# Patient Record
Sex: Male | Born: 1950 | Race: White | Hispanic: No | Marital: Married | Smoking: Never smoker
Health system: Southern US, Community
[De-identification: ages and names within clinical notes are randomized; demographics above are authoritative.]

## PROBLEM LIST (undated history)

## (undated) DIAGNOSIS — H269 Unspecified cataract: Secondary | ICD-10-CM

## (undated) DIAGNOSIS — D473 Essential (hemorrhagic) thrombocythemia: Secondary | ICD-10-CM

## (undated) DIAGNOSIS — I499 Cardiac arrhythmia, unspecified: Secondary | ICD-10-CM

## (undated) DIAGNOSIS — D72829 Elevated white blood cell count, unspecified: Secondary | ICD-10-CM

## (undated) DIAGNOSIS — C9210 Chronic myeloid leukemia, BCR/ABL-positive, not having achieved remission: Secondary | ICD-10-CM

## (undated) DIAGNOSIS — IMO0001 Reserved for inherently not codable concepts without codable children: Secondary | ICD-10-CM

## (undated) DIAGNOSIS — D649 Anemia, unspecified: Secondary | ICD-10-CM

## (undated) HISTORY — PX: HERNIA REPAIR: SHX51

## (undated) HISTORY — DX: Essential (hemorrhagic) thrombocythemia: D47.3

## (undated) HISTORY — PX: TONSILLECTOMY: SUR1361

## (undated) HISTORY — DX: Elevated white blood cell count, unspecified: D72.829

## (undated) HISTORY — DX: Reserved for inherently not codable concepts without codable children: IMO0001

## (undated) HISTORY — DX: Chronic myeloid leukemia, BCR/ABL-positive, not having achieved remission: C92.10

## (undated) HISTORY — DX: Unspecified cataract: H26.9

---

## 2003-04-11 ENCOUNTER — Encounter: Admission: RE | Admit: 2003-04-11 | Discharge: 2003-04-11 | Payer: Self-pay

## 2004-03-08 HISTORY — PX: JOINT REPLACEMENT: SHX530

## 2010-03-08 HISTORY — PX: EYE SURGERY: SHX253

## 2012-09-22 ENCOUNTER — Telehealth: Payer: Self-pay | Admitting: Oncology

## 2012-09-22 NOTE — Telephone Encounter (Signed)
Spoke with Tiara from Dr  Tawana Scale office in ref to np appt on 10/17/12@10 :00.  Tiara will notify pt of appt. Mailed np packet

## 2012-09-26 ENCOUNTER — Telehealth: Payer: Self-pay | Admitting: Oncology

## 2012-09-26 NOTE — Telephone Encounter (Signed)
C/D 09/26/12 for appt 10/17/12

## 2012-10-17 ENCOUNTER — Encounter: Payer: Self-pay | Admitting: Oncology

## 2012-10-17 ENCOUNTER — Ambulatory Visit (HOSPITAL_BASED_OUTPATIENT_CLINIC_OR_DEPARTMENT_OTHER): Payer: BC Managed Care – PPO | Admitting: Lab

## 2012-10-17 ENCOUNTER — Telehealth: Payer: Self-pay | Admitting: Oncology

## 2012-10-17 ENCOUNTER — Other Ambulatory Visit: Payer: BC Managed Care – PPO | Admitting: Lab

## 2012-10-17 ENCOUNTER — Ambulatory Visit: Payer: BC Managed Care – PPO

## 2012-10-17 ENCOUNTER — Ambulatory Visit (HOSPITAL_BASED_OUTPATIENT_CLINIC_OR_DEPARTMENT_OTHER): Payer: BC Managed Care – PPO | Admitting: Oncology

## 2012-10-17 VITALS — BP 176/95 | HR 81 | Temp 98.1°F | Resp 18 | Ht 74.0 in | Wt 208.8 lb

## 2012-10-17 DIAGNOSIS — D473 Essential (hemorrhagic) thrombocythemia: Secondary | ICD-10-CM | POA: Insufficient documentation

## 2012-10-17 DIAGNOSIS — D72829 Elevated white blood cell count, unspecified: Secondary | ICD-10-CM

## 2012-10-17 DIAGNOSIS — C9210 Chronic myeloid leukemia, BCR/ABL-positive, not having achieved remission: Secondary | ICD-10-CM

## 2012-10-17 DIAGNOSIS — D75839 Thrombocytosis, unspecified: Secondary | ICD-10-CM

## 2012-10-17 DIAGNOSIS — D720 Genetic anomalies of leukocytes: Secondary | ICD-10-CM

## 2012-10-17 HISTORY — DX: Elevated white blood cell count, unspecified: D72.829

## 2012-10-17 HISTORY — DX: Thrombocytosis, unspecified: D75.839

## 2012-10-17 LAB — MANUAL DIFFERENTIAL
ALC: 6.2 10*3/uL — ABNORMAL HIGH (ref 0.9–3.3)
ANC (CHCC manual diff): 60.3 10*3/uL — ABNORMAL HIGH (ref 1.5–6.5)
Blasts: 1 % — ABNORMAL HIGH (ref 0–0)
MONO: 4 % (ref 0–14)
Metamyelocytes: 7 % — ABNORMAL HIGH (ref 0–0)
Myelocytes: 7 % — ABNORMAL HIGH (ref 0–0)
Other Cell: 0 % (ref 0–0)
PLT EST: INCREASED
PROMYELO: 1 % — ABNORMAL HIGH (ref 0–0)
SEG: 46 % (ref 38–77)
Variant Lymph: 0 % (ref 0–0)

## 2012-10-17 LAB — CBC WITH DIFFERENTIAL/PLATELET
HCT: 40.8 % (ref 38.4–49.9)
MCH: 28.9 pg (ref 27.2–33.4)
MCHC: 33.8 g/dL (ref 32.0–36.0)
Platelets: 796 10*3/uL — ABNORMAL HIGH (ref 140–400)
RBC: 4.78 10*6/uL (ref 4.20–5.82)

## 2012-10-17 LAB — COMPREHENSIVE METABOLIC PANEL (CC13)
AST: 26 U/L (ref 5–34)
Alkaline Phosphatase: 59 U/L (ref 40–150)
BUN: 14.3 mg/dL (ref 7.0–26.0)
Creatinine: 1.1 mg/dL (ref 0.7–1.3)
Glucose: 95 mg/dl (ref 70–140)
Potassium: 4.2 mEq/L (ref 3.5–5.1)
Total Bilirubin: 0.78 mg/dL (ref 0.20–1.20)

## 2012-10-17 LAB — LACTATE DEHYDROGENASE (CC13): LDH: 582 U/L — ABNORMAL HIGH (ref 125–245)

## 2012-10-17 NOTE — Telephone Encounter (Signed)
, °

## 2012-10-17 NOTE — Progress Notes (Signed)
Checked in new pt with no financial concerns. °

## 2012-10-17 NOTE — Patient Instructions (Addendum)
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 It is called "chronic" because it develops more slowly than other forms of leukemia, which are considered "acute." CAUSES  No one knows the exact cause of this condition. You have a higher risk of developing this kind of leukemia if you have:  An abnormal chromosome called a "[place] chromosome." Chromosomes contain your genes, which determine your physical traits (i.e. eye or hair color).  Had radiation treatment for some other condition or form of cancer.  Been exposed to radiation due to fallout from a nuclear bomb or in the wake of a nuclear accident. SYMPTOMS  At first, some people do not have symptoms of chronic myelogenous leukemia. After a while, people may notice some symptoms, such as:   Feeling more tired than usual, even after rest.  Unplanned weight loss.  Heavy sweating at night.  Fevers.  Paleness.  A feeling of fullness in the upper left part of the abdomen.  Easy bruising and/or bleeding.  More frequent infections. DIAGNOSIS   During a physical exam, your caregiver may notice that you have an enlarged spleen, liver and/or lymph nodes.  Blood and bone marrow tests may show the presence of the [place] chromosome, as well as other abnormalities. TREATMENT  There are different types of treatment used for this condition, including:  Targeted drugs. These medications interfere with chemicals the leukemia cells need in order to grow and multiply.  Chemotherapy drugs. These medications kill cells that are multiplying quickly, such as leukemia cells.  Biological therapy. This treatment boosts the ability of the patient's own immune system to fight the leukemia cells.  Bone marrow or peripheral blood stem cell transplant. This  treatment allows the patient to receive very high doses of chemotherapy and/or radiation. These high doses kill the cancer cells, but also destroy the bone marrow. After treatment is complete, the patient is given donor bone marrow or stem cells, which will replace the bone marrow.  Leukapheresis. This involves sending the patient's blood through a machine to cleanse it of leukemia cells. HOME CARE INSTRUCTIONS   Take all medications exactly as directed. Only take over-the-counter or prescription medicines for pain, discomfort or fever as directed by your caregiver  Although some of your treatments might affect your appetite, try to eat regular, healthy meals.  If you develop any side effects, tell your caregiver. He or she may have recommendations of things you can do to improve symptoms.  Consider learning some ways to cope with the stress of having a chronic illness, such as yoga, meditation, or participating in a support group. SEEK IMMEDIATE MEDICAL CARE IF YOU:  Develop an unexplained oral temperature of 102 F (38.9 C) or more.  Develop chest pains.  Feel short of breath.  Feel light-headed or pass out.  Notice pain, swelling or redness anywhere in your legs.  Have uncontrollable bleeding, such as a nosebleed that will not stop.  Are unable to stop throwing up (vomiting).  Cannot keep liquids down. Document Released: 02/05/2008 Document Revised: 05/17/2011 Document Reviewed: 02/05/2008 [facility] Patient Information 2014 [place], [place].

## 2012-10-18 ENCOUNTER — Encounter (HOSPITAL_COMMUNITY): Payer: Self-pay

## 2012-10-18 ENCOUNTER — Ambulatory Visit (HOSPITAL_COMMUNITY)
Admission: RE | Admit: 2012-10-18 | Discharge: 2012-10-18 | Disposition: A | Discharge status: Home / Self Care | Payer: BC Managed Care – PPO | Source: Ambulatory Visit | Attending: Oncology | Admitting: Oncology

## 2012-10-18 DIAGNOSIS — I2510 Atherosclerotic heart disease of native coronary artery without angina pectoris: Secondary | ICD-10-CM | POA: Insufficient documentation

## 2012-10-18 DIAGNOSIS — N289 Disorder of kidney and ureter, unspecified: Secondary | ICD-10-CM | POA: Insufficient documentation

## 2012-10-18 DIAGNOSIS — M47812 Spondylosis without myelopathy or radiculopathy, cervical region: Secondary | ICD-10-CM | POA: Insufficient documentation

## 2012-10-18 DIAGNOSIS — M4310 Spondylolisthesis, site unspecified: Secondary | ICD-10-CM | POA: Insufficient documentation

## 2012-10-18 DIAGNOSIS — D72829 Elevated white blood cell count, unspecified: Secondary | ICD-10-CM

## 2012-10-18 DIAGNOSIS — D75839 Thrombocytosis, unspecified: Secondary | ICD-10-CM

## 2012-10-18 DIAGNOSIS — C9210 Chronic myeloid leukemia, BCR/ABL-positive, not having achieved remission: Secondary | ICD-10-CM

## 2012-10-18 DIAGNOSIS — D473 Essential (hemorrhagic) thrombocythemia: Secondary | ICD-10-CM

## 2012-10-18 DIAGNOSIS — I658 Occlusion and stenosis of other precerebral arteries: Secondary | ICD-10-CM | POA: Insufficient documentation

## 2012-10-18 DIAGNOSIS — K4090 Unilateral inguinal hernia, without obstruction or gangrene, not specified as recurrent: Secondary | ICD-10-CM | POA: Insufficient documentation

## 2012-10-18 DIAGNOSIS — I6529 Occlusion and stenosis of unspecified carotid artery: Secondary | ICD-10-CM | POA: Insufficient documentation

## 2012-10-18 DIAGNOSIS — M24019 Loose body in unspecified shoulder: Secondary | ICD-10-CM | POA: Insufficient documentation

## 2012-10-18 DIAGNOSIS — K7689 Other specified diseases of liver: Secondary | ICD-10-CM | POA: Insufficient documentation

## 2012-10-18 LAB — DIRECT ANTIGLOBULIN TEST (NOT AT ARMC)
DAT (Complement): NEGATIVE
DAT IgG: NEGATIVE

## 2012-10-18 MED ORDER — IOHEXOL 300 MG/ML  SOLN
100.0000 mL | Freq: Once | INTRAMUSCULAR | Status: AC | PRN
  Administered 2012-10-18: 100 mL via INTRAVENOUS

## 2012-10-19 ENCOUNTER — Encounter: Payer: Self-pay | Admitting: Oncology

## 2012-10-20 ENCOUNTER — Telehealth: Payer: Self-pay | Admitting: *Deleted

## 2012-10-20 NOTE — Telephone Encounter (Signed)
Please let him know his CBC results

## 2012-10-20 NOTE — Telephone Encounter (Signed)
Pt called requesting lab results from 8/12. Will review with MD

## 2012-10-21 ENCOUNTER — Telehealth: Payer: Self-pay | Admitting: *Deleted

## 2012-10-24 ENCOUNTER — Telehealth: Payer: Self-pay | Admitting: Medical Oncology

## 2012-10-24 ENCOUNTER — Other Ambulatory Visit (HOSPITAL_COMMUNITY): Payer: BC Managed Care – PPO

## 2012-10-24 NOTE — Telephone Encounter (Signed)
Called patient, per MD, to confirm receipt of lab results. Patient states he has results expressed thanks. Asked about CT results from last week, informed patient not resulted at this time. Patient verbalized understanding, knows to call office with any questions or concerns.  sched appt with MD 09/02

## 2012-10-25 ENCOUNTER — Encounter (HOSPITAL_COMMUNITY): Payer: Self-pay | Admitting: Pharmacy Technician

## 2012-10-26 ENCOUNTER — Other Ambulatory Visit: Payer: Self-pay | Admitting: Radiology

## 2012-10-30 NOTE — Progress Notes (Signed)
[facility]  Telephone:([phone] Fax:([phone]    INITIAL HEMATOLOGY CONSULTATION    Referral MD:   Dr. 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 Benedetto Goad 4 health maintenance on 08/21/2012. As part of his workup patient had blood work performed including a CBC. He was incidentally found to have an elevated white count and platelet count. Total white count was 42.4 and the platelets were also elevated at 720,000. There were some smudge cells noted. There was a neutrophilia. He had a repeat count performed on 09/12/2012 the white count at that time with 67.5 platelets 705,000. He was noted to have metamyelocytes myelocytes promyelocytes nucleated RBCs in neutrophilia. Another repeat performed on 10/03/2012 revealed a white count to be 76.7 platelet slightly lower at 604,000 but he continued to have left shift. Because of this he is now seen in hematology for further evaluation. Clinically patient is asymptomatic he has not noticed any fevers chills night sweats headaches shortness of breath chest pains palpitations no myalgias and arthralgias. No weight loss or gain no early satiety he has not noticed any masses no lymphadenopathy. Remainder of the 14 point review of systems is negative.    Past Medical History  Diagnosis Date  . Leukocytosis, unspecified 10/17/2012  . Thrombocytosis 10/17/2012  :    History reviewed. No pertinent past surgical history.:   CURRENT MEDS: Current Outpatient Prescriptions  Medication Sig Dispense Refill  . Cholecalciferol (VITAMIN D) 2000 UNITS tablet Take 2,000 Units by mouth 2 (two) times daily.      . Coenzyme Q10 (CO Q-10) 100 MG CAPS Take 100 mg by mouth daily.      . fish oil-omega-3 fatty acids 1000 MG capsule Take 1 g by mouth daily.      . Flaxseed,  Linseed, (FLAX SEED OIL) 1000 MG CAPS Take 1,000 mg by mouth daily.      . folic acid (FOLVITE) 400 MCG tablet Take 400 mcg by mouth daily.      Marland Kitchen OVER THE COUNTER MEDICATION Take 1 capsule by mouth daily. quercetin      . OVER THE COUNTER MEDICATION Take 1 packet by mouth daily. Protease plus      . OVER THE COUNTER MEDICATION Take 1 Package by mouth daily. Royal camu powder      . OVER THE COUNTER MEDICATION Take 2 capsules by mouth 2 (two) times daily. coleus      . OVER THE COUNTER MEDICATION Take 2 capsules by mouth 3 (three) times daily with meals. spurlinina      . saw palmetto 160 MG capsule Take 160 mg by mouth daily.      . TURMERIC PO Take 2 capsules by mouth 2 (two) times daily.       No current facility-administered medications for this visit.      No Known Allergies:  History reviewed. No pertinent family history.:  History   Social History  . Marital Status: Single    Spouse Name: [name]    Number of Children: [name]  . Years of Education: [name]   Occupational History  . Not on file.   Social History Main Topics  . Smoking status: Never Smoker   . Smokeless tobacco: Never Used  . Alcohol Use: 4.2 oz/week    7 Glasses of wine per week  . Drug Use: No  .  Sexual Activity: Yes   Other Topics Concern  . Not on file   Social History Narrative  . No narrative on file  :  REVIEW OF SYSTEM:  The rest of the 14-point review of sytem was negative.   Exam: @IPVITALS @  General:  well-nourished in no acute distress.  Eyes:  no scleral icterus.  ENT:  There were no oropharyngeal lesions.  Neck was without thyromegaly.  Lymphatics:  Negative cervical, supraclavicular or axillary adenopathy.  Respiratory: lungs were clear bilaterally without wheezing or crackles.  Cardiovascular:  Regular rate and rhythm, S1/S2, without murmur, rub or gallop.  There was no pedal edema.  GI:  abdomen was soft, flat, nontender, nondistended, without organomegaly.  Muscoloskeletal:  no spinal  tenderness of palpation of vertebral spine.  Skin exam was without echymosis, petichae.  Neuro exam was nonfocal.  Patient was able to get on and off exam table without assistance.  Gait was normal.  Patient was alerted and oriented.  Attention was good.   Language was appropriate.  Mood was normal without depression.  Speech was not pressured.  Thought content was not tangential.    LABS:  Lab Results  Component Value Date   WBC 77.3* 10/17/2012   HGB 13.8 10/17/2012   HCT 40.8 10/17/2012   PLT 796* 10/17/2012   GLUCOSE 95 10/17/2012   ALT 31 10/17/2012   AST 26 10/17/2012   NA 140 10/17/2012   K 4.2 10/17/2012   CREATININE 1.1 10/17/2012   BUN 14.3 10/17/2012   CO2 24 10/17/2012    Ct Soft Tissue Neck W Contrast  10/18/2012   *RADIOLOGY REPORT*  Clinical Data: CML.  History of leukocytosis.  CT NECK WITH CONTRAST  Technique:  Multidetector CT imaging of the neck was performed with intravenous contrast.  Contrast: OMNIPAQUE IOHEXOL 300 MG/ML  SOLN  Comparison: None.  Findings: Suprahyoid neck:  Normal.  Larynx:  Normal.  Infrahyoid neck:  Normal.  Lymph nodes:  No pathologic adenopathy.  Upper chest/mediastinum:  Please see separate chest CT dictation.  Additional:  Nonstenotic atheromatous change both carotid bifurcations, with calcification.  No intracranial masses.  No sinus disease.  Cervical spondylosis without worrisome osseous lesions.  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   Original Report Authenticated By: Davonna Belling, M.D.   Ct Chest W Contrast  10/18/2012   *RADIOLOGY REPORT*  Clinical Data:  Cm bowel with lymphadenopathy.  CT CHEST, ABDOMEN AND PELVIS WITH CONTRAST  Technique:  Multidetector CT imaging of the chest, abdomen and pelvis was performed following the standard protocol during bolus administration of intravenous contrast.  Contrast: OMNIPAQUE IOHEXOL 300 MG/ML  SOLN  Comparison:   None.  CT CHEST  Findings:   There is no axillary lymphadenopathy.  No mediastinal or hilar lymphadenopathy.  Heart size is upper normal. Coronary artery calcification is noted.  There is no pericardial or pleural effusion.  Lungs are clear without edema or focal airspace consolidation.  No evidence for parenchymal nodule or mass.  Bone windows reveal no worrisome lytic or sclerotic osseous lesions.  Degenerative changes with intra-articular loose body are seen in the left shoulder.  IMPRESSION: No evidence for lymphadenopathy in the chest.  CT ABDOMEN AND PELVIS  Findings:  The liver measures 18.7 cm in cranial caudal length, enlarged.  The spleen measures 15.5 cm, borderline enlarged.  The stomach, duodenum, pancreas, gallbladder, and adrenal glands are unremarkable.  Left kidney is normal.  12 mm  low density lesion in the interpolar right kidney is likely a cyst.  No abdominal aortic aneurysm.  There is no free fluid or lymphadenopathy.  Atherosclerotic calcification is noted in the wall of the abdominal aorta.  Imaging through the pelvis shows no free intraperitoneal fluid. There is no pelvic sidewall lymphadenopathy.  The prostate gland is markedly enlarged.  Left inguinal hernia contains only fat.  Diverticular changes are seen in the left colon without diverticulitis.  The terminal ileum and the appendix are normal.  Bone windows reveal no worrisome lytic or sclerotic osseous lesions.  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  IMPRESSION: Borderline to mild hepatosplenomegaly.  No lymphadenopathy in the abdomen or pelvis.   Original Report Authenticated By: Kennith Center, M.D.   Ct Abdomen Pelvis W Contrast  10/18/2012   *RADIOLOGY REPORT*  Clinical Data:  Cm bowel with lymphadenopathy.  CT CHEST, ABDOMEN AND PELVIS WITH CONTRAST  Technique:  Multidetector CT imaging of the chest, abdomen and pelvis was performed following the standard protocol during bolus administration of intravenous contrast.  Contrast:  OMNIPAQUE IOHEXOL 300 MG/ML  SOLN  Comparison:   None.  CT CHEST  Findings:  There is no axillary lymphadenopathy.  No mediastinal or hilar lymphadenopathy.  Heart size is upper normal. Coronary artery calcification is noted.  There is no pericardial or pleural effusion.  Lungs are clear without edema or focal airspace consolidation.  No evidence for parenchymal nodule or mass.  Bone windows reveal no worrisome lytic or sclerotic osseous lesions.  Degenerative changes with intra-articular loose body are seen in the left shoulder.  IMPRESSION: No evidence for lymphadenopathy in the chest.  CT ABDOMEN AND PELVIS  Findings:  The liver measures 18.7 cm in cranial caudal length, enlarged.  The spleen measures 15.5 cm, borderline enlarged.  The stomach, duodenum, pancreas, gallbladder, and adrenal glands are unremarkable.  Left kidney is normal.  12 mm low density lesion in the interpolar right kidney is likely a cyst.  No abdominal aortic aneurysm.  There is no free fluid or lymphadenopathy.  Atherosclerotic calcification is noted in the wall of the abdominal aorta.  Imaging through the pelvis shows no free intraperitoneal fluid. There is no pelvic sidewall lymphadenopathy.  The prostate gland is markedly enlarged.  Left inguinal hernia contains only fat.  Diverticular changes are seen in the left colon without diverticulitis.  The terminal ileum and the appendix are normal.  Bone windows reveal no worrisome lytic or sclerotic osseous lesions.  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  IMPRESSION: Borderline to mild hepatosplenomegaly.  No lymphadenopathy in the abdomen or pelvis.   Original Report Authenticated By: Kennith Center, M.D.      ASSESSMENT AND PLAN: 62 year old gentleman with thrombocytosis and leukocytosis and a left shift. His differential does include CML versus CLL. Patient is clinically asymptomatic. I discussed the differential with the patient. I discussed the pathophysiology of CML  as well as CLL.  #1 patient will need further workup including CT chest abdomen pelvis and neck to look for adenopathy.  #2 we will also check CBC CMET, iron studies and peripheral blood flow cytometry as well as peripheral blood BCR/ABL    #3 patient will also need a bone marrow biopsy and aspirate for further workup and staging.Cindi Carbon   #4 I will see him back in a few weeks time for follow  Drue Second, MD Medical/Oncology [facility] [phone] (beeper) [phone] (Office)

## 2012-10-31 ENCOUNTER — Encounter: Payer: Self-pay | Admitting: Oncology

## 2012-11-01 ENCOUNTER — Ambulatory Visit (HOSPITAL_COMMUNITY)
Admission: RE | Admit: 2012-11-01 | Discharge: 2012-11-01 | Disposition: A | Discharge status: Home / Self Care | Payer: BC Managed Care – PPO | Source: Ambulatory Visit | Attending: Oncology | Admitting: Oncology

## 2012-11-01 ENCOUNTER — Encounter (HOSPITAL_COMMUNITY): Payer: Self-pay

## 2012-11-01 DIAGNOSIS — C9210 Chronic myeloid leukemia, BCR/ABL-positive, not having achieved remission: Secondary | ICD-10-CM

## 2012-11-01 LAB — CBC
Platelets: 881 10*3/uL — ABNORMAL HIGH (ref 150–400)
RDW: 18.8 % — ABNORMAL HIGH (ref 11.5–15.5)
WBC: 83.9 10*3/uL (ref 4.0–10.5)

## 2012-11-01 LAB — APTT: aPTT: 33 seconds (ref 24–37)

## 2012-11-01 LAB — BONE MARROW EXAM

## 2012-11-01 LAB — PROTIME-INR: INR: 1.16 (ref 0.00–1.49)

## 2012-11-01 MED ORDER — MIDAZOLAM HCL 2 MG/2ML IJ SOLN
INTRAMUSCULAR | Status: AC | PRN
  Administered 2012-11-01 (×4): 1 mg via INTRAVENOUS

## 2012-11-01 MED ORDER — HYDROCODONE-ACETAMINOPHEN 5-325 MG PO TABS
1.0000 | ORAL_TABLET | ORAL | Status: DC | PRN
  Filled 2012-11-01: qty 2

## 2012-11-01 MED ORDER — FENTANYL CITRATE 0.05 MG/ML IJ SOLN
INTRAMUSCULAR | Status: AC
  Filled 2012-11-01: qty 6

## 2012-11-01 MED ORDER — MIDAZOLAM HCL 2 MG/2ML IJ SOLN
INTRAMUSCULAR | Status: AC
  Filled 2012-11-01: qty 6

## 2012-11-01 MED ORDER — SODIUM CHLORIDE 0.9 % IV SOLN
Freq: Once | INTRAVENOUS | Status: AC
  Administered 2012-11-01: 07:00:00 via INTRAVENOUS

## 2012-11-01 MED ORDER — FENTANYL CITRATE 0.05 MG/ML IJ SOLN
INTRAMUSCULAR | Status: DC | PRN
  Administered 2012-11-01: 50 ug via INTRAVENOUS

## 2012-11-01 MED ORDER — FENTANYL CITRATE 0.05 MG/ML IJ SOLN
INTRAMUSCULAR | Status: AC | PRN
  Administered 2012-11-01: 50 ug via INTRAVENOUS
  Administered 2012-11-01: 100 ug via INTRAVENOUS

## 2012-11-01 NOTE — Progress Notes (Signed)
CRITICAL VALUE ALERT  Critical value received:  WBC 83.9  Date of notification:  11/01/2012  Time of notification:  0810  Critical value read back: yes  Nurse who received alert:  Harrietta Guardian RN  MD notified (1st page):  Coreen PA  Time of first page:  0810  MD notified (2nd page):  Time of second page:  Responding MD:  same  Time MD responded:  [phone]

## 2012-11-01 NOTE — H&P (Signed)
Chief Complaint: "I am here for a bone marrow biopsy." Referring Physician: Dr. Welton Flakes HPI: Larry Vasquez is an 62 y.o. male with leukocytosis and thrombocytosis, patient has been seen by Dr. Welton Flakes in office and denies any weight loss, chest pain, shortness of breath, recent illness, fever, night sweats, or chills. He denies any blood in his stool or urine. CT was performed of neck, chest, abdomen and pelvis with no lymphadenopathy seen. Patient is here today for bone marrow biopsy.   Past Medical History:  Past Medical History  Diagnosis Date  . Leukocytosis, unspecified 10/17/2012  . Thrombocytosis 10/17/2012    Past Surgical History:  Past Surgical History  Procedure Laterality Date  . Joint replacement  2006    Total Knee Left  . Hernia repair    . Tonsillectomy    . Eye surgery  2012    Detached Retina    Family History: No family history on file.  Social History:  reports that he has never smoked. He has never used smokeless tobacco. He reports that he drinks about 4.2 ounces of alcohol per week. He reports that he does not use illicit drugs.  Allergies:  Allergies  Allergen Reactions  . Corticosteroids Other (See Comments)    Stiffness    Medications:   Medication List    ASK your doctor about these medications       Co Q-10 100 MG Caps  Take 100 mg by mouth daily.     fish oil-omega-3 fatty acids 1000 MG capsule  Take 1 g by mouth daily.     Flax Seed Oil 1000 MG Caps  Take 1,000 mg by mouth daily.     folic acid 400 MCG tablet  Commonly known as:  FOLVITE  Take 400 mcg by mouth daily.     OVER THE COUNTER MEDICATION  Take 1 capsule by mouth daily. quercetin     OVER THE COUNTER MEDICATION  Take 1 packet by mouth daily. Protease plus     OVER THE COUNTER MEDICATION  Take 1 Package by mouth daily. Royal camu powder     OVER THE COUNTER MEDICATION  Take 2 capsules by mouth 2 (two) times daily. coleus     OVER THE COUNTER MEDICATION  Take 2 capsules  by mouth 3 (three) times daily with meals. spurlinina     saw palmetto 160 MG capsule  Take 160 mg by mouth daily.     TURMERIC PO  Take 2 capsules by mouth 2 (two) times daily.     Vitamin D 2000 UNITS tablet  Take 2,000 Units by mouth 2 (two) times daily.        Please HPI for pertinent positives, otherwise complete 10 system ROS negative.  Physical Exam: BP 157/91  Pulse 81  Temp(Src) 97.8 F (36.6 C) (Oral)  Resp 20  Ht 6\' 2"  (1.88 m)  Wt 208 lb (94.348 kg)  BMI 26.69 kg/m2  SpO2 97% Body mass index is 26.69 kg/(m^2).   General Appearance:  Alert, cooperative, no distress, appears stated age  Head:  Normocephalic, without obvious abnormality, atraumatic  Lungs:   Clear to auscultation bilaterally, no w/r/r, respirations unlabored without use of accessory muscles.  Chest Wall:  No tenderness or deformity  Heart:  Regular rate and rhythm, S1, S2 normal, no murmur, rub or gallop.  Abdomen:   Soft, non-tender, non distended.  Extremities: Extremities normal, atraumatic, no cyanosis or edema  Pulses: 2+ and symmetric  Neurologic: Normal affect, no gross deficits.  Results for orders placed during the hospital encounter of 11/01/12 (from the past 48 hour(s))  APTT     Status: None   Collection Time    11/01/12  7:25 AM      Result Value Range   aPTT 33  24 - 37 seconds  CBC     Status: Abnormal   Collection Time    11/01/12  7:25 AM      Result Value Range   WBC 83.9 (*) 4.0 - 10.5 K/uL   Comment: WHITE COUNT CONFIRMED ON SMEAR     REPEATED TO VERIFY     CRITICAL RESULT CALLED TO, READ BACK BY AND VERIFIED WITH:     J. PIGPEN RN AT 0800 ON 08.27.14 BY SHUEA   RBC 4.44  4.22 - 5.81 MIL/uL   Hemoglobin 13.0  13.0 - 17.0 g/dL   HCT 16.1 (*) 09.6 - 04.5 %   MCV 86.9  78.0 - 100.0 fL   MCH 29.3  26.0 - 34.0 pg   MCHC 33.7  30.0 - 36.0 g/dL   RDW 40.9 (*) 81.1 - 91.4 %   Platelets 881 (*) 150 - 400 K/uL  PROTIME-INR     Status: None   Collection Time     11/01/12  7:25 AM      Result Value Range   Prothrombin Time 14.6  11.6 - 15.2 seconds   INR 1.16  0.00 - 1.49   No results found.  Assessment/Plan Leukocytosis. Thrombocytosis. Patient seen by Dr. Welton Flakes and request for CT guided bone marrow biopsy. Labs reviewed, patient NPO.  Risks and Benefits discussed with the patient and his family. All of the patient's questions were answered, patient is agreeable to proceed. Consent signed and in chart.     Pattricia Boss D PA-C 11/01/2012, 8:32 AM

## 2012-11-01 NOTE — Procedures (Signed)
Interventional Radiology Procedure Note  Procedure: CT guided aspirate and core biopsy of right iliac bone Complications: None Recommendations: - Bedrest supine x 2 hrs - Hydrocodone PRN  Pain - Follow biopsy results  Signed,  Heath K. McCullough, MD Vascular & Interventional Radiologist [facility]  

## 2012-11-01 NOTE — H&P (Signed)
Agree with PA note.  Signed,  Heath K. McCullough, MD V[facility]iology [facility]  

## 2012-11-07 ENCOUNTER — Telehealth: Payer: Self-pay | Admitting: Oncology

## 2012-11-07 ENCOUNTER — Other Ambulatory Visit: Payer: Self-pay

## 2012-11-07 ENCOUNTER — Ambulatory Visit (HOSPITAL_BASED_OUTPATIENT_CLINIC_OR_DEPARTMENT_OTHER): Payer: BC Managed Care – PPO | Admitting: Oncology

## 2012-11-07 VITALS — BP 161/91 | HR 80 | Temp 98.3°F | Resp 20 | Ht 74.0 in | Wt 206.6 lb

## 2012-11-07 DIAGNOSIS — C9210 Chronic myeloid leukemia, BCR/ABL-positive, not having achieved remission: Secondary | ICD-10-CM

## 2012-11-07 MED ORDER — ALLOPURINOL 300 MG PO TABS: 300.0000 mg | ORAL_TABLET | Freq: Every day | ORAL | Status: DC

## 2012-11-07 MED ORDER — IMATINIB MESYLATE 400 MG PO TABS: 400.0000 mg | ORAL_TABLET | Freq: Every day | ORAL | Status: DC

## 2012-11-07 NOTE — Patient Instructions (Addendum)
#1 we discussed your bone marrow biopsy results, peripheral blood for BCR able results, CT scan results,.  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  #3 we discussed treatment options including use of imatinib 400 mg daily. We discussed risks benefits and side effects. More information is as below.  #4 we will continue to monitor your blood counts on a weekly basis for the first month and then every 2 weeks thereafter.  #5 we discussed use of allopurinol.  #6 I will see you back in one month's time for followup.  Imatinib tablets What is this medicine? IMATINIB (i MAT in ib) is a chemotherapy drug. It targets a specific protein within cancer cells and stops the cancer cells from growing. It is used to treat certain leukemias, myelodysplastic syndromes, and other cancers. It is also used to treat specific digestive tract tumors called GISTs. This medicine may be used for other purposes; ask your health care provider or pharmacist if you have questions. What should I tell my health care provider before I take this medicine? They need to know if you have any of these conditions: -bleeding problems -infection (especially a virus infection such as chickenpox, cold sores, or herpes) -heart disease -heart failure -kidney disease -liver disease -lung disease -stomach problems -an unusual or allergic reaction to imatinib, other medicines, foods, dyes, or preservatives -pregnant or trying to get pregnant -breast-feeding How should I use this medicine? Take this medicine by mouth with a full glass of water. Take it with food to decrease the chance of it upsetting your stomach. Do not take with grapefruit juice. Follow the directions on the prescription label. Take your medicine at regular intervals. Do not take it more often than directed. Do not stop taking except on your doctor's advice. If you have difficulty swallowing the tablets, let your doctor, pharmacist, or health care  professional know. They can help you with advice. Talk to your pediatrician regarding the use of this medicine in children. While this drug may be prescribed for children as young as 1 year for selected conditions, precautions do apply. Overdosage: If you think you have taken too much of this medicine contact a poison control center or emergency room at once. NOTE: This medicine is only for you. Do not share this medicine with others. What if I miss a dose? If you miss a dose, take it as soon as you can. If it is almost time for your next dose, take only that dose and skip your missed dose. Do not take double or extra doses. What may interact with this medicine? -antiviral medicines for HIV or AIDS -bosentan -cisapride -clarithromycin -cyclosporine -dexamethasone -diltiazem -ergot alkaloids like dihydroergotamine, ergonovine, ergotamine, methylergonovine -erythromycin -grapefruit or grapefruit juice -medicines for cholesterol like atorvastatin lovastatin, simvastatin -medicines for depression, anxiety, or psychotic disturbances -medicines for fungal infections like ketoconazole and itraconazole -medicines for irregular heart beat like amiodarone, bepridil, dofetilide, encainide, flecainide, propafenone, quinidine -medicines for seizures like carbamazepine, phenobarbital, phenytoin -medicines for sleep -NSAIDS, medicines for pain and inflammation, like ibuprofen or naproxen -pimozide -rifabutin -rifampin -sildenafil -sirolimus -St. John's wort -tacrolimus -vaccines -verapamil -warfarin Talk to your doctor or health care professional before taking any of these medicines: -acetaminophen -aspirin -ibuprofen -ketoprofen -naproxen This list may not describe all possible interactions. Give your health care provider a list of all the medicines, herbs, non-prescription drugs, or dietary supplements you use. Also tell them if you smoke, drink alcohol, or use illegal drugs. Some items  may interact with  your medicine. What should I watch for while using this medicine? Visit your doctor for checks on your progress. You will need to have regular blood tests while on this medicine. Report any new symptoms promptly. Call your doctor or health care professional for advice if you get a fever, chills or sore throat, or other symptoms of a cold or flu. Do not treat yourself. This drug decreases your body's ability to fight infections. Try to avoid being around people who are sick. This medicine may increase your risk to bruise or bleed. Call your doctor or health care professional if you notice any unusual bleeding. Be careful brushing and flossing your teeth or using a toothpick because you may get an infection or bleed more easily. If you have any dental work done, tell your dentist you are receiving this medicine. Avoid taking products that contain aspirin, acetaminophen, ibuprofen, naproxen, or ketoprofen unless instructed by your doctor. These medicines may hide a fever. Do not become pregnant while taking this medicine. Women should inform their doctor if they wish to become pregnant or think they might be pregnant. There is a potential for serious side effects to an unborn child. Men should inform their doctors if they wish to father a child. This medicine may lower sperm counts. Talk to your health care professional or pharmacist for more information. Do not breast-feed an infant while taking this medicine. What side effects may I notice from receiving this medicine? Side effects that you should report to your doctor or health care professional as soon as possible: -low blood counts - this medicine may decrease the number of white blood cells, red blood cells and platelets. You may be at increased risk for infections and bleeding. -signs of infection - fever or chills, cough, sore throat, pain or difficulty passing urine -signs of decreased platelets or bleeding - bruising, pinpoint  red spots on the skin, black, tarry stools, blood in the urine, nosebleeds -signs of decreased red blood cells - unusually weak or tired, fainting spells, lightheadedness -allergic reactions like skin rash, itching or hives, swelling of the face, lips, or tongue -breathing problems -changes in vision -dark urine -general ill feeling or flu-like symptoms -light-colored stools -loss of appetite -mouth sores -redness, blistering, peeling or loosening of the skin, including inside the mouth -right upper belly pain -swelling of the legs or ankles -trouble passing urine or change in the amount of urine -vomiting -yellowing of the eyes or skin Side effects that usually do not require medical attention (report to your doctor or health care professional if they continue or are bothersome): -decreased appetite -diarrhea -difficulty sleeping -headache -heartburn -joint pain -muscle cramps or pain -nausea -upset stomach This list may not describe all possible side effects. Call your doctor for medical advice about side effects. You may report side effects to FDA at 1-800-FDA-1088. Where should I keep my medicine? Keep out of reach of children. Store tablets at room temperature between 15 and 30 degrees C (59 and 86 degrees F). Protect from moisture. Keep tightly closed. Throw away any unused medicine after the expiration date. NOTE: This sheet is a summary. It may not cover all possible information. If you have questions about this medicine, talk to your doctor, pharmacist, or health care provider.  2013, [employer]. (04/13/2011 11:00:30 AM)

## 2012-11-07 NOTE — Telephone Encounter (Signed)
, °

## 2012-11-14 ENCOUNTER — Other Ambulatory Visit (HOSPITAL_BASED_OUTPATIENT_CLINIC_OR_DEPARTMENT_OTHER): Payer: BC Managed Care – PPO | Admitting: Lab

## 2012-11-14 DIAGNOSIS — C9210 Chronic myeloid leukemia, BCR/ABL-positive, not having achieved remission: Secondary | ICD-10-CM

## 2012-11-14 LAB — MANUAL DIFFERENTIAL
ANC (CHCC manual diff): 75.4 10*3/uL — ABNORMAL HIGH (ref 1.5–6.5)
Basophil: 4 % — ABNORMAL HIGH (ref 0–2)
Blasts: 1 % — ABNORMAL HIGH (ref 0–0)
LYMPH: 9 % — ABNORMAL LOW (ref 14–49)
Metamyelocytes: 8 % — ABNORMAL HIGH (ref 0–0)
PLT EST: INCREASED
Smudge Cells: 0

## 2012-11-14 LAB — CBC WITH DIFFERENTIAL/PLATELET
MCH: 29.4 pg (ref 27.2–33.4)
MCHC: 34.1 g/dL (ref 32.0–36.0)
RDW: 18.5 % — ABNORMAL HIGH (ref 11.0–14.6)

## 2012-11-19 NOTE — Progress Notes (Signed)
OFFICE PROGRESS NOTE  CC  Pamelia Hoit, MD [address] [place] [address]  DIAGNOSIS:  62 year old male new diagnosis of CML  PRIOR THERAPY: #1 seen by Dr. Benedetto Goad 4 health maintenance on 08/21/2012. As part of his workup patient had blood work performed including a CBC. He was incidentally found to have an elevated white count and platelet count. Total white count was 42.4 and the platelets were also elevated at 720,000. There were some smudge cells noted. There was a neutrophilia. He had a repeat count performed on 09/12/2012 the white count at that time with 67.5 platelets 705,000. He was noted to have metamyelocytes myelocytes promyelocytes nucleated RBCs in neutrophilia. Another repeat performed on 10/03/2012 revealed a white count to be 76.7 platelet slightly lower at 604,000 but he continued to have left shift  #2 Bone marrow biopsy/Aspirate Bone Marrow, Aspirate,Biopsy, and Clot, right iliac - CHRONIC MYELOGENOUS LEUKEMIA. PERIPHERAL BLOOD: - CHRONIC MYELOGENOUS LEUKEMIA. Diagnosis Note Previous analysis for BCR/ABL1 performed at [facility] on 10/20/12 is positive as analyzed by quantitative RT-PCR technique. Given the latter finding along with the morphology in the bone marrow and peripheral blood, the features are consistent with chronic myelogenous leukemia. (BNS:gt, 11/02/12)  #3 proceed with gleevec 400 mg daily 11/07/12  CURRENT THERAPY: gleevec 400 mg daily  INTERVAL HISTORY: Larry Vasquez 62 y.o. male returns for follow up. He is doing well, no other complaints. No peripheral paresthesia, no headaches or fevers or chills, No myalgias or arthralgias. We discussed the side effects from gleevec.   MEDICAL HISTORY: Past Medical History  Diagnosis Date  . Leukocytosis, unspecified 10/17/2012  . Thrombocytosis 10/17/2012    ALLERGIES:  is allergic to corticosteroids.  MEDICATIONS:  Current Outpatient Prescriptions  Medication Sig Dispense Refill  .  Cholecalciferol (VITAMIN D) 2000 UNITS tablet Take 2,000 Units by mouth 2 (two) times daily.      . Coenzyme Q10 (CO Q-10) 100 MG CAPS Take 100 mg by mouth daily.      . fish oil-omega-3 fatty acids 1000 MG capsule Take 1 g by mouth daily.      . Flaxseed, Linseed, (FLAX SEED OIL) 1000 MG CAPS Take 1,000 mg by mouth daily.      . folic acid (FOLVITE) 400 MCG tablet Take 400 mcg by mouth daily.      Marland Kitchen OVER THE COUNTER MEDICATION Take 1 capsule by mouth daily. quercetin      . OVER THE COUNTER MEDICATION Take 1 packet by mouth daily. Protease plus      . OVER THE COUNTER MEDICATION Take 1 Package by mouth daily. Royal camu powder      . OVER THE COUNTER MEDICATION Take 2 capsules by mouth 2 (two) times daily. coleus      . OVER THE COUNTER MEDICATION Take 2 capsules by mouth 3 (three) times daily with meals. spurlinina      . OVER THE COUNTER MEDICATION Take by mouth daily. Black seed oil      . saw palmetto 160 MG capsule Take 160 mg by mouth daily.      . TURMERIC PO Take 2 capsules by mouth 2 (two) times daily.      Marland Kitchen allopurinol (ZYLOPRIM) 300 MG tablet Take 1 tablet (300 mg total) by mouth daily.  30 tablet  1  . imatinib (GLEEVEC) 400 MG tablet Take 1 tablet (400 mg total) by mouth daily. Take with meals and large glass of water.Caution:Chemotherapy.  30 tablet  6  No current facility-administered medications for this visit.    SURGICAL HISTORY:  Past Surgical History  Procedure Laterality Date  . Joint replacement  2006    Total Knee Left  . Hernia repair    . Tonsillectomy    . Eye surgery  2012    Detached Retina    REVIEW OF SYSTEMS:  A comprehensive review of systems was negative.   HEALTH MAINTENANCE:  PHYSICAL EXAMINATION: Blood pressure 161/91, pulse 80, temperature 98.3 F (36.8 C), temperature source Oral, resp. rate 20, height 6\' 2"  (1.88 m), weight 206 lb 9.6 oz (93.713 kg). Body mass index is 26.51 kg/(m^2). ECOG PERFORMANCE STATUS: 0 - Asymptomatic   General  appearance: alert, cooperative and appears stated age Lymph nodes: Cervical, supraclavicular, and axillary nodes normal. Resp: clear to auscultation bilaterally Cardio: regular rate and rhythm GI: soft, non-tender; bowel sounds normal; no masses,  no organomegaly Extremities: extremities normal, atraumatic, no cyanosis or edema Neurologic: Grossly normal   LABORATORY DATA: Lab Results  Component Value Date   WBC 99.2* 11/14/2012   HGB 13.6 11/14/2012   HCT 39.9 11/14/2012   MCV 86.2 11/14/2012   PLT 770* 11/14/2012      Chemistry      Component Value Date/Time   NA 140 10/17/2012 1246   K 4.2 10/17/2012 1246   CO2 24 10/17/2012 1246   BUN 14.3 10/17/2012 1246   CREATININE 1.1 10/17/2012 1246      Component Value Date/Time   CALCIUM 9.4 10/17/2012 1246   ALKPHOS 59 10/17/2012 1246   AST 26 10/17/2012 1246   ALT 31 10/17/2012 1246   BILITOT 0.78 10/17/2012 1246       RADIOGRAPHIC STUDIES:  Ct Biopsy  11/01/2012   *RADIOLOGY REPORT*  CT GUIDED RIGHT ILIAC BONE MARROW ASPIRATION AND BONE MARROW CORE BIOPSIES  Date:  11/01/2012  Clinical History: 62 year old male with leukocytopenia and thrombocytopenia.  There is clinical concern for underlying chronic myelogenous leukemia or other marrow abnormality.  CT guided bone marrow biopsy is requested.  Procedures Performed:  1. CT guided bone marrow aspiration and core biopsy  Interventional Radiologist:  Sterling Big, MD  Sedation: Moderate (conscious) sedation was used.  Four mg Versed, 200 mcg Fentanyl were administered intravenously.  The patient's vital signs were monitored continuously by radiology nursing throughout the procedure.  Sedation Time: 10 minutes  Fluoroscopy time: None  PROCEDURE/FINDINGS:  Informed consent was obtained from the patient following explanation of the procedure, risks, benefits and alternatives. The patient understands, agrees and consents for the procedure. All questions were addressed.  A time out was performed.   The patient was positioned prone and noncontrast localization CT was performed of the pelvis to demonstrate the iliac marrow spaces.  Maximal barrier sterile technique utilized including caps, mask, sterile gowns, sterile gloves, large sterile drape, hand hygiene, and betadine prep.  Under sterile conditions and local anesthesia, an 11 gauge coaxial bone biopsy needle was advanced into the right iliac marrow space. Needle position was confirmed with CT imaging. Initially, bone marrow aspiration was performed. Next, the 11 gauge outer cannula was utilized to obtain a right iliac bone marrow core biopsy. Needle was removed. Hemostasis was obtained with compression. The patient tolerated the procedure well. Samples were prepared with the cytotechnologist. No immediate complications.  IMPRESSION:  CT guided right iliac bone marrow aspiration and core biopsy.  Signed,  Sterling Big, MD Vascular & Interventional Radiologist [facility]   Original Report Authenticated By: Malachy Moan, M.D.  ASSESSMENT: 62 year old gentleman with  #1new diagnosis of CML we discussed the pathophysiology and treatment options, we discussed the risks and benefits  #2 he will begin gleevec 400 mg daily, we will check his counts weekly initially then once we have normalised  Then we will check it once a month   PLAN:   #1 proceed with gleevec daily, he will also begin allopurinol 300 mg daily  #2 check cbc q monthly  #3 I will see him back in 1 month with labs   All questions were answered. The patient knows to call the clinic with any problems, questions or concerns. We can certainly see the patient much sooner if necessary.  I spent 20 minutes counseling the patient face to face. The total time spent in the appointment was 25 minutes.    Drue Second, MD Medical/Oncology [facility] [phone] (beeper) [phone] (Office)

## 2012-11-21 ENCOUNTER — Ambulatory Visit (HOSPITAL_BASED_OUTPATIENT_CLINIC_OR_DEPARTMENT_OTHER): Payer: BC Managed Care – PPO | Admitting: Lab

## 2012-11-21 DIAGNOSIS — C9210 Chronic myeloid leukemia, BCR/ABL-positive, not having achieved remission: Secondary | ICD-10-CM

## 2012-11-21 LAB — MANUAL DIFFERENTIAL
ALC: 3.7 10*3/uL — ABNORMAL HIGH (ref 0.9–3.3)
ANC (CHCC manual diff): 60.6 10*3/uL — ABNORMAL HIGH (ref 1.5–6.5)
Blasts: 1 % — ABNORMAL HIGH (ref 0–0)
LYMPH: 5 % — ABNORMAL LOW (ref 14–49)
Metamyelocytes: 9 % — ABNORMAL HIGH (ref 0–0)
Myelocytes: 14 % — ABNORMAL HIGH (ref 0–0)
PROMYELO: 0 % (ref 0–0)
Variant Lymph: 0 % (ref 0–0)

## 2012-11-21 LAB — CBC WITH DIFFERENTIAL/PLATELET
HCT: 38.9 % (ref 38.4–49.9)
MCHC: 33.4 g/dL (ref 32.0–36.0)
RBC: 4.5 10*6/uL (ref 4.20–5.82)
RDW: 18.3 % — ABNORMAL HIGH (ref 11.0–14.6)

## 2012-11-28 ENCOUNTER — Ambulatory Visit (HOSPITAL_BASED_OUTPATIENT_CLINIC_OR_DEPARTMENT_OTHER): Payer: BC Managed Care – PPO

## 2012-11-28 DIAGNOSIS — D473 Essential (hemorrhagic) thrombocythemia: Secondary | ICD-10-CM

## 2012-11-28 DIAGNOSIS — C9210 Chronic myeloid leukemia, BCR/ABL-positive, not having achieved remission: Secondary | ICD-10-CM

## 2012-11-28 LAB — MANUAL DIFFERENTIAL
Band Neutrophils: 2 % (ref 0–10)
Basophil: 5 % — ABNORMAL HIGH (ref 0–2)
LYMPH: 19 % (ref 14–49)
MONO: 4 % (ref 0–14)
Metamyelocytes: 2 % — ABNORMAL HIGH (ref 0–0)
PLT EST: INCREASED
Smudge Cells: 0
nRBC: 0 % (ref 0–0)

## 2012-11-28 LAB — CBC WITH DIFFERENTIAL/PLATELET
HCT: 38.4 % (ref 38.4–49.9)
MCH: 28.8 pg (ref 27.2–33.4)
MCHC: 33.3 g/dL (ref 32.0–36.0)
MCV: 86.3 fL (ref 79.3–98.0)
Platelets: 840 10*3/uL — ABNORMAL HIGH (ref 140–400)

## 2012-11-29 ENCOUNTER — Telehealth: Payer: Self-pay | Admitting: Medical Oncology

## 2012-11-29 NOTE — Telephone Encounter (Signed)
Per MD, informed patient of lab results and white count and for patient to continue with gleevac. Patient expressed thanks, confirmed 09/30 appt. Denies any questions at this time.

## 2012-11-29 NOTE — Telephone Encounter (Signed)
Message copied by Rexene Edison on Wed Nov 29, 2012  3:31 PM ------      Message from: Larry Vasquez      Created: Wed Nov 29, 2012 12:20 PM       Call patient: white count coming down nicely, continue gleevec ------

## 2012-11-29 NOTE — Progress Notes (Signed)
Quick Note:  Call patient: white count coming down nicely, continue gleevec ______

## 2012-12-05 ENCOUNTER — Telehealth: Payer: Self-pay | Admitting: *Deleted

## 2012-12-05 ENCOUNTER — Other Ambulatory Visit: Payer: BC Managed Care – PPO | Admitting: Lab

## 2012-12-05 ENCOUNTER — Ambulatory Visit (HOSPITAL_BASED_OUTPATIENT_CLINIC_OR_DEPARTMENT_OTHER): Payer: BC Managed Care – PPO | Admitting: Oncology

## 2012-12-05 VITALS — BP 144/84 | HR 79 | Temp 98.4°F | Resp 18 | Ht 74.0 in | Wt 207.0 lb

## 2012-12-05 DIAGNOSIS — C9210 Chronic myeloid leukemia, BCR/ABL-positive, not having achieved remission: Secondary | ICD-10-CM

## 2012-12-05 LAB — CBC WITH DIFFERENTIAL/PLATELET
BASO%: 6.9 % — ABNORMAL HIGH (ref 0.0–2.0)
Eosinophils Absolute: 0.5 10*3/uL (ref 0.0–0.5)
HCT: 36.7 % — ABNORMAL LOW (ref 38.4–49.9)
LYMPH%: 18.9 % (ref 14.0–49.0)
MCHC: 33.2 g/dL (ref 32.0–36.0)
MCV: 85.9 fL (ref 79.3–98.0)
MONO#: 0.3 10*3/uL (ref 0.1–0.9)
MONO%: 5.5 % (ref 0.0–14.0)
NEUT#: 3.8 10*3/uL (ref 1.5–6.5)
NEUT%: 61 % (ref 39.0–75.0)
Platelets: 715 10*3/uL — ABNORMAL HIGH (ref 140–400)
RBC: 4.27 10*6/uL (ref 4.20–5.82)
WBC: 6.2 10*3/uL (ref 4.0–10.3)

## 2012-12-05 NOTE — Progress Notes (Signed)
OFFICE PROGRESS NOTE  CC  Pamelia Hoit, MD [address] [place] [address]  DIAGNOSIS:  62 year old male new diagnosis of CML  PRIOR THERAPY: #1 seen by Dr. Benedetto Goad 4 health maintenance on 08/21/2012. As part of his workup patient had blood work performed including a CBC. He was incidentally found to have an elevated white count and platelet count. Total white count was 42.4 and the platelets were also elevated at 720,000. There were some smudge cells noted. There was a neutrophilia. He had a repeat count performed on 09/12/2012 the white count at that time with 67.5 platelets 705,000. He was noted to have metamyelocytes myelocytes promyelocytes nucleated RBCs in neutrophilia. Another repeat performed on 10/03/2012 revealed a white count to be 76.7 platelet slightly lower at 604,000 but he continued to have left shift  #2 Bone marrow biopsy/Aspirate Bone Marrow, Aspirate,Biopsy, and Clot, right iliac - CHRONIC MYELOGENOUS LEUKEMIA. PERIPHERAL BLOOD: - CHRONIC MYELOGENOUS LEUKEMIA. Diagnosis Note Previous analysis for BCR/ABL1 performed at [facility] on 10/20/12 is positive as analyzed by quantitative RT-PCR technique. Given the latter finding along with the morphology in the bone marrow and peripheral blood, the features are consistent with chronic myelogenous leukemia. (BNS:gt, 11/02/12)  #3 proceed with gleevec 400 mg daily 11/07/12  CURRENT THERAPY: gleevec 400 mg daily  INTERVAL HISTORY: Larry Vasquez 62 y.o. male returns for follow up. He is doing well, no other complaints. No peripheral paresthesia, no headaches or fevers or chills, No myalgias or arthralgias. We discussed the side effects from gleevec.   MEDICAL HISTORY: Past Medical History  Diagnosis Date  . Leukocytosis, unspecified 10/17/2012  . Thrombocytosis 10/17/2012    ALLERGIES:  is allergic to corticosteroids.  MEDICATIONS:  Current Outpatient Prescriptions  Medication Sig Dispense Refill  .  allopurinol (ZYLOPRIM) 300 MG tablet Take 1 tablet (300 mg total) by mouth daily.  30 tablet  1  . Cholecalciferol (VITAMIN D) 2000 UNITS tablet Take 2,000 Units by mouth 2 (two) times daily.      . Coenzyme Q10 (CO Q-10) 100 MG CAPS Take 100 mg by mouth daily.      . fish oil-omega-3 fatty acids 1000 MG capsule Take 1 g by mouth daily.      . Flaxseed, Linseed, (FLAX SEED OIL) 1000 MG CAPS Take 1,000 mg by mouth daily.      . folic acid (FOLVITE) 400 MCG tablet Take 400 mcg by mouth daily.      Marland Kitchen imatinib (GLEEVEC) 400 MG tablet Take 1 tablet (400 mg total) by mouth daily. Take with meals and large glass of water.Caution:Chemotherapy.  30 tablet  6  . OVER THE COUNTER MEDICATION Take 1 capsule by mouth daily. quercetin      . OVER THE COUNTER MEDICATION Take 1 packet by mouth daily. Protease plus      . OVER THE COUNTER MEDICATION Take 1 Package by mouth daily. Royal camu powder      . OVER THE COUNTER MEDICATION Take 2 capsules by mouth 2 (two) times daily. coleus      . OVER THE COUNTER MEDICATION Take 2 capsules by mouth 3 (three) times daily with meals. spurlinina      . OVER THE COUNTER MEDICATION Take by mouth daily. Black seed oil      . saw palmetto 160 MG capsule Take 160 mg by mouth daily.      . TURMERIC PO Take 2 capsules by mouth 2 (two) times daily.  No current facility-administered medications for this visit.    SURGICAL HISTORY:  Past Surgical History  Procedure Laterality Date  . Joint replacement  2006    Total Knee Left  . Hernia repair    . Tonsillectomy    . Eye surgery  2012    Detached Retina    REVIEW OF SYSTEMS:  A comprehensive review of systems was negative.   HEALTH MAINTENANCE:  PHYSICAL EXAMINATION: Blood pressure 144/84, pulse 79, temperature 98.4 F (36.9 C), temperature source Oral, resp. rate 18, height 6\' 2"  (1.88 m), weight 207 lb (93.895 kg). Body mass index is 26.57 kg/(m^2). ECOG PERFORMANCE STATUS: 0 - Asymptomatic   General  appearance: alert, cooperative and appears stated age Lymph nodes: Cervical, supraclavicular, and axillary nodes normal. Resp: clear to auscultation bilaterally Cardio: regular rate and rhythm GI: soft, non-tender; bowel sounds normal; no masses,  no organomegaly Extremities: extremities normal, atraumatic, no cyanosis or edema Neurologic: Grossly normal   LABORATORY DATA: Lab Results  Component Value Date   WBC 6.2 12/05/2012   HGB 12.2* 12/05/2012   HCT 36.7* 12/05/2012   MCV 85.9 12/05/2012   PLT 715* 12/05/2012      Chemistry      Component Value Date/Time   NA 140 10/17/2012 1246   K 4.2 10/17/2012 1246   CO2 24 10/17/2012 1246   BUN 14.3 10/17/2012 1246   CREATININE 1.1 10/17/2012 1246      Component Value Date/Time   CALCIUM 9.4 10/17/2012 1246   ALKPHOS 59 10/17/2012 1246   AST 26 10/17/2012 1246   ALT 31 10/17/2012 1246   BILITOT 0.78 10/17/2012 1246       RADIOGRAPHIC STUDIES:  Ct Biopsy  11/01/2012   *RADIOLOGY REPORT*  CT GUIDED RIGHT ILIAC BONE MARROW ASPIRATION AND BONE MARROW CORE BIOPSIES  Date:  11/01/2012  Clinical History: 62 year old male with leukocytopenia and thrombocytopenia.  There is clinical concern for underlying chronic myelogenous leukemia or other marrow abnormality.  CT guided bone marrow biopsy is requested.  Procedures Performed:  1. CT guided bone marrow aspiration and core biopsy  Interventional Radiologist:  Sterling Big, MD  Sedation: Moderate (conscious) sedation was used.  Four mg Versed, 200 mcg Fentanyl were administered intravenously.  The patient's vital signs were monitored continuously by radiology nursing throughout the procedure.  Sedation Time: 10 minutes  Fluoroscopy time: None  PROCEDURE/FINDINGS:  Informed consent was obtained from the patient following explanation of the procedure, risks, benefits and alternatives. The patient understands, agrees and consents for the procedure. All questions were addressed.  A time out was  performed.  The patient was positioned prone and noncontrast localization CT was performed of the pelvis to demonstrate the iliac marrow spaces.  Maximal barrier sterile technique utilized including caps, mask, sterile gowns, sterile gloves, large sterile drape, hand hygiene, and betadine prep.  Under sterile conditions and local anesthesia, an 11 gauge coaxial bone biopsy needle was advanced into the right iliac marrow space. Needle position was confirmed with CT imaging. Initially, bone marrow aspiration was performed. Next, the 11 gauge outer cannula was utilized to obtain a right iliac bone marrow core biopsy. Needle was removed. Hemostasis was obtained with compression. The patient tolerated the procedure well. Samples were prepared with the cytotechnologist. No immediate complications.  IMPRESSION:  CT guided right iliac bone marrow aspiration and core biopsy.  Signed,  Sterling Big, MD Vascular & Interventional Radiologist [facility]   Original Report Authenticated By: Malachy Moan, M.D.  ASSESSMENT: 62 year old gentleman with  #1new diagnosis of CML we discussed the pathophysiology and treatment options, we discussed the risks and benefits  #2 he will begin gleevec 400 mg daily, we will check his counts weekly initially then once we have normalised  Then we will check it once a month   PLAN:  #1 patient has responded nicely to the Gleevec with normalization of his white count. However her platelets still remain elevated.  #2 we will continue to monitor his CBC every 2 weeks.  #3 I will see him back in one month's time for followup.  All questions were answered. The patient knows to call the clinic with any problems, questions or concerns. We can certainly see the patient much sooner if necessary.  I spent 20 minutes counseling the patient face to face. The total time spent in the appointment was 25 minutes.    Drue Second, MD Medical/Oncology [facility] [phone] (beeper) [phone] (Office)

## 2012-12-05 NOTE — Telephone Encounter (Signed)
appts made and printed...td 

## 2012-12-05 NOTE — Patient Instructions (Addendum)
Continue gleevec 400 mg daily  Check labs every 2 weeks now  I will see you back in 1 month

## 2012-12-08 ENCOUNTER — Encounter: Payer: Self-pay | Admitting: *Deleted

## 2012-12-08 ENCOUNTER — Encounter: Payer: Self-pay | Admitting: Oncology

## 2012-12-08 NOTE — Progress Notes (Signed)
RECEIVED A FAX FROM [facility] CONCERNING A PRIOR AUTHORIZATION FOR GLEEVEC. THIS REQUEST WAS PLACED IN THE MANAGED CARE BIN.

## 2012-12-08 NOTE — Progress Notes (Signed)
[facility], [id number], approved gleevec 400mg  from 11/08/12-12/08/15.

## 2012-12-19 ENCOUNTER — Other Ambulatory Visit: Payer: BC Managed Care – PPO | Admitting: Lab

## 2012-12-19 DIAGNOSIS — C9210 Chronic myeloid leukemia, BCR/ABL-positive, not having achieved remission: Secondary | ICD-10-CM

## 2012-12-19 LAB — CBC WITH DIFFERENTIAL/PLATELET
BASO%: 4.9 % — ABNORMAL HIGH (ref 0.0–2.0)
Basophils Absolute: 0.2 10*3/uL — ABNORMAL HIGH (ref 0.0–0.1)
EOS%: 7 % (ref 0.0–7.0)
HCT: 39.5 % (ref 38.4–49.9)
HGB: 13.2 g/dL (ref 13.0–17.1)
MCH: 29 pg (ref 27.2–33.4)
MCHC: 33.4 g/dL (ref 32.0–36.0)
MCV: 86.8 fL (ref 79.3–98.0)
MONO%: 6.8 % (ref 0.0–14.0)
NEUT%: 57.4 % (ref 39.0–75.0)
Platelets: 521 10*3/uL — ABNORMAL HIGH (ref 140–400)

## 2013-01-01 HISTORY — PX: CATARACT EXTRACTION: SUR2

## 2013-01-04 ENCOUNTER — Other Ambulatory Visit (HOSPITAL_BASED_OUTPATIENT_CLINIC_OR_DEPARTMENT_OTHER): Payer: BC Managed Care – PPO | Admitting: Lab

## 2013-01-04 ENCOUNTER — Ambulatory Visit: Payer: BC Managed Care – PPO | Admitting: Oncology

## 2013-01-04 ENCOUNTER — Encounter: Payer: Self-pay | Admitting: Family

## 2013-01-04 ENCOUNTER — Telehealth: Payer: Self-pay | Admitting: Oncology

## 2013-01-04 ENCOUNTER — Ambulatory Visit (HOSPITAL_BASED_OUTPATIENT_CLINIC_OR_DEPARTMENT_OTHER): Payer: BC Managed Care – PPO | Admitting: Family

## 2013-01-04 VITALS — BP 165/90 | HR 69 | Temp 98.1°F | Resp 18 | Ht 74.0 in | Wt 208.1 lb

## 2013-01-04 DIAGNOSIS — C9210 Chronic myeloid leukemia, BCR/ABL-positive, not having achieved remission: Secondary | ICD-10-CM

## 2013-01-04 LAB — CBC WITH DIFFERENTIAL/PLATELET
BASO%: 5.2 % — ABNORMAL HIGH (ref 0.0–2.0)
EOS%: 5.2 % (ref 0.0–7.0)
Eosinophils Absolute: 0.3 10*3/uL (ref 0.0–0.5)
LYMPH%: 19.9 % (ref 14.0–49.0)
MCH: 28.6 pg (ref 27.2–33.4)
MCHC: 33.5 g/dL (ref 32.0–36.0)
MCV: 85.3 fL (ref 79.3–98.0)
MONO#: 0.4 10*3/uL (ref 0.1–0.9)
MONO%: 8.2 % (ref 0.0–14.0)
Platelets: 475 10*3/uL — ABNORMAL HIGH (ref 140–400)
RBC: 4.62 10*6/uL (ref 4.20–5.82)
RDW: 15.3 % — ABNORMAL HIGH (ref 11.0–14.6)
lymph#: 1.1 10*3/uL (ref 0.9–3.3)
nRBC: 0 % (ref 0–0)

## 2013-01-04 MED ORDER — IMATINIB MESYLATE 400 MG PO TABS: 400.0000 mg | ORAL_TABLET | Freq: Every day | ORAL | Status: DC

## 2013-01-04 NOTE — Patient Instructions (Addendum)
Please contact us at [phone] if you have any questions or concerns.  Please continue to do well and enjoy life!!!  Get plenty of rest, drink plenty of water, exercise daily (walking), eat a balanced diet.  Results for orders placed in visit on 01/04/13 (from the past 24 hour(s))  CBC WITH DIFFERENTIAL     Status: Abnormal   Collection Time    01/04/13 10:34 AM      Result Value Range   WBC 5.4  4.0 - 10.3 10e3/uL   NEUT# 3.3  1.5 - 6.5 10e3/uL   HGB 13.2  13.0 - 17.1 g/dL   HCT 45.4  09.8 - 11.9 %   Platelets 475 (*) 140 - 400 10e3/uL   MCV 85.3  79.3 - 98.0 fL   MCH 28.6  27.2 - 33.4 pg   MCHC 33.5  32.0 - 36.0 g/dL   RBC 1.47  8.29 - 5.62 10e6/uL   RDW 15.3 (*) 11.0 - 14.6 %   lymph# 1.1  0.9 - 3.3 10e3/uL   MONO# 0.4  0.1 - 0.9 10e3/uL   Eosinophils Absolute 0.3  0.0 - 0.5 10e3/uL   Basophils Absolute 0.3 (*) 0.0 - 0.1 10e3/uL   NEUT% 61.5  39.0 - 75.0 %   LYMPH% 19.9  14.0 - 49.0 %   MONO% 8.2  0.0 - 14.0 %   EOS% 5.2  0.0 - 7.0 %   BASO% 5.2 (*) 0.0 - 2.0 %   nRBC 0  0 - 0 %   Narrative:    Performed At:  [facility]               [address]               [place], [place] [address]

## 2013-01-04 NOTE — Progress Notes (Addendum)
[facility]  [phone] Fax:(336) [phone]  OFFICE PROGRESS NOTE   ID: Larry Vasquez   DOB: Nov 11, 1950  MR#: [id number]  FAO#:[id number]   PCP: Pamelia Hoit, MD   DIAGNOSIS:  Larry Vasquez is a 62 y.o. 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   PRIOR THERAPY: #1 Patient was seen by Dr. Benedetto Goad for routine health maintenance on 08/21/2012. As part of his workup patient had blood work performed including a CBC. He was incidentally found to have an elevated white count and platelet count. Total white count was 42.4 and the platelets were also elevated at 720,000. There were some smudge cells noted. There was neutrophilia. He had a repeat CBC performed on 09/12/2012 which revealed a white count of 67.5 and platelets of 705,000.  Specimen was noted to have metamyelocytes, myelocytes, promyelocytes, nucleated RBCs in neutrophilia. A repeat CBC performed on 10/03/2012 revealed a white count of 76.7, platelets at 604,000 and a continued  left shift.  #2 Bone marrow biopsy/Aspirate on 11/01/2012 showed: Bone Marrow, Aspirate,Biopsy, and Clot, right iliac - CHRONIC MYELOGENOUS LEUKEMIA. PERIPHERAL BLOOD: - CHRONIC MYELOGENOUS LEUKEMIA. Diagnosis Note Previous analysis for BCR/ABL1 performed at [facility] on 10/20/12 is positive as analyzed by quantitative.  RT-PCR technique. Given the latter finding along with the morphology in the bone marrow and peripheral blood, the features are consistent with chronic myelogenous leukemia. (BNS:gt, 11/02/12)  #3 Started takng Gleevec 400 mg PO daily on 11/07/2012.    CURRENT THERAPY: Gleevec 400 mg PO daily.   INTERVAL HISTORY: Larry Vasquez is a 62 y.o. male who returns for follow up of CML. Since his last office visit on 12/05/2012, he reports that he's been doing well with the exception of occasional leg cramps at night.  He reports that taking a spoonful of yellow mustard by mouth resolves his legs cramps.  He denies  any other symptomatology and is tolerating Gleevec well. His interval history is significant for undergoing left eye cataract extraction surgery on 01/01/2013.  His interval history is otherwise unremarkable.    MEDICAL HISTORY: Past Medical History  Diagnosis Date  . Leukocytosis, unspecified 10/17/2012  . Thrombocytosis 10/17/2012  . CML (chronic myelocytic leukemia)   . Cataract     Left eye    ALLERGIES:   Allergies  Allergen Reactions  . Corticosteroids Other (See Comments)    Stiffness     MEDICATIONS:  Current Outpatient Prescriptions  Medication Sig Dispense Refill  . Cholecalciferol (VITAMIN D) 2000 UNITS tablet Take 2,000 Units by mouth 2 (two) times daily.      . Coenzyme Q10 (CO Q-10) 100 MG CAPS Take 100 mg by mouth daily.      . fish oil-omega-3 fatty acids 1000 MG capsule Take 1 g by mouth daily.      . Flaxseed, Linseed, (FLAX SEED OIL) 1000 MG CAPS Take 1,000 mg by mouth daily.      . folic acid (FOLVITE) 400 MCG tablet Take 400 mcg by mouth daily.      Marland Kitchen imatinib (GLEEVEC) 400 MG tablet Take 1 tablet (400 mg total) by mouth daily. Take with meals and large glass of water.Caution:Chemotherapy.  90 tablet  5  . OVER THE COUNTER MEDICATION Take 1 capsule by mouth daily. quercetin      . OVER THE COUNTER MEDICATION Take 1 packet by mouth daily. Protease plus      . OVER THE COUNTER MEDICATION Take 2 capsules by mouth 2 (two) times daily. coleus      .  OVER THE COUNTER MEDICATION Take 2 capsules by mouth 3 (three) times daily with meals. spurlinina      . OVER THE COUNTER MEDICATION Take by mouth daily. Black seed oil      . saw palmetto 160 MG capsule Take 160 mg by mouth daily.      . TURMERIC PO Take 2 capsules by mouth 2 (two) times daily.       No current facility-administered medications for this visit.    SURGICAL HISTORY:  Past Surgical History  Procedure Laterality Date  . Joint replacement  2006    Total Knee Left  . Hernia repair    . Tonsillectomy     . Eye surgery  2012    Detached Retina  . Cataract extraction Left 01/01/2013    REVIEW OF SYSTEMS:  Pertinent items are noted in HPI.   A 10 point review of systems was completed and is negative except as noted above.  Larry Vasquez denies any other symptomatology including fatigue, fever or chills, headache, vision changes, swollen glands, cough or shortness of breath, chest pain or discomfort, nausea, vomiting, diarrhea, constipation, change in urinary or bowel habits, any other arthralgias/myalgias, unusual bleeding/bruising or any other symptomatology.   PHYSICAL EXAMINATION: Blood pressure 165/90, pulse 69, temperature 98.1 F (36.7 C), temperature source Oral, resp. rate 18, height 6\' 2"  (1.88 m), weight 208 lb 1.6 oz (94.394 kg). Body mass index is 26.71 kg/(m^2).  ECOG PERFORMANCE STATUS: 1 - Symptomatic but completely ambulatory  General appearance: Alert, cooperative, well nourished, no apparent distress Head: Normocephalic, without obvious abnormality, atraumatic Eyes: Conjunctivae/corneas clear, PERRLA, EOMI, the left pupil is physically larger than the right pupil, both pupils have brisk pupillary reaction Nose: Nares, septum and mucosa are normal, no drainage or sinus tenderness Neck: No adenopathy, supple, symmetrical, trachea midline, no tenderness Resp: Clear to auscultation bilaterally, no wheezes/rales/rhonchi Cardio: Regular rate and rhythm, S1, S2 normal, no murmur, click, rub or gallop, no edema GI: Soft, not distended, non-tender, hypoactive bowel sounds, no organomegaly Skin: No rashes/lesions, skin warm and dry, no erythematous areas, no cyanosis  M/S:  Atraumatic, normal strength in all extremities, normal range of motion, no clubbing  Lymph nodes: Cervical, supraclavicular, and axillary nodes normal Neurologic: Grossly normal, cranial nerves II through XII intact, alert and oriented x 3 Psych: Appropriate affect   LABORATORY DATA: Lab Results  Component  Value Date   WBC 5.4 01/04/2013   HGB 13.2 01/04/2013   HCT 39.4 01/04/2013   MCV 85.3 01/04/2013   PLT 475* 01/04/2013      Chemistry      Component Value Date/Time   NA 140 10/17/2012 1246   K 4.2 10/17/2012 1246   CO2 24 10/17/2012 1246   BUN 14.3 10/17/2012 1246   CREATININE 1.1 10/17/2012 1246      Component Value Date/Time   CALCIUM 9.4 10/17/2012 1246   ALKPHOS 59 10/17/2012 1246   AST 26 10/17/2012 1246   ALT 31 10/17/2012 1246   BILITOT 0.78 10/17/2012 1246       RADIOGRAPHIC STUDIES: No results found.   ASSESSMENT: Larry Vasquez is a 62 y.o. gentleman diagnosed with CML in 10/2012:  #1 Routine health maintenance monitoring by his PCP on 08/21/2012 showed a CBC with a total white count of 42.4 and platelets were also elevated at 720,000. There were some smudge cells noted. There was neutrophilia. He had a repeat CBC performed on 09/12/2012 which revealed a white count of 67.5 and platelets of  705,000.  Specimen was noted to have metamyelocytes, myelocytes, promyelocytes, nucleated RBCs in neutrophilia. A repeat CBC performed on 10/03/2012 revealed a white count of 76.7, platelets at 604,000 and a continued  left shift.  #2  Bone marrow biopsy from right iliac on 11/01/2012 showed chronic myelogenous leukemia in both the bone marrow aspirate and peripheral blood.  #3  He started therapy with Gleevec 400 mg PO daily on 11/07/2012.     PLAN:  #1  WBCs normalized on 12/05/2012 at 6.2, however platelets remain elevated at 475, but are showing a downward trend.  We will continue to check laboratories (CBC) and schedule an office visit monthly.  Larry Vasquez was told he could stop taking Allopurinol today.  An electronic prescription for Gleevec 400 mg by mouth daily #90 with 5 refills was sent electronically to [facility].    All questions answered.  Larry Vasquez was encouraged to contact us in the interim with any questions, concerns, or problems.   Larina Bras,  NP-C 01/06/2013  8:41 PM   ATTENDING'S ATTESTATION:  I personally reviewed patient's chart, examined patient myself, formulated the treatment plan as followed.    Patient is doing well. His CBC has normalized. He is tolerating Gleevec very well. His dose will remain stable at 400 mg daily. At this time recommendation is to have his blood count done on a monthly basis. And I will see him in one month's time for followup.  Drue Second, MD Medical/Oncology [facility] [phone] (beeper) [phone] (Office)  01/11/2013, 7:20 PM

## 2013-01-04 NOTE — Telephone Encounter (Signed)
, °

## 2013-01-11 ENCOUNTER — Other Ambulatory Visit: Payer: Self-pay

## 2013-01-16 ENCOUNTER — Other Ambulatory Visit: Payer: BC Managed Care – PPO | Admitting: Lab

## 2013-01-24 ENCOUNTER — Encounter: Payer: Self-pay | Admitting: Adult Health

## 2013-01-26 ENCOUNTER — Telehealth: Payer: Self-pay | Admitting: *Deleted

## 2013-01-26 NOTE — Telephone Encounter (Signed)
Pt called reports he is taking Gleevac  400mg  1 PO Daily. Pt has Dry, itchy, prickling, warm to touch, rash began Sunday 11/15  to right arm/thigh, worsens at night. Pt states "it's more annoying really". Pt reports sleeping well, and can wait to be seen on Tues 11/25 as previously scheduled, however thought he should call and let provider know. Denies fever, chest pain, rash is not weeping or spreading.  Pt currently uses [facility]. Will review with provider.

## 2013-01-26 NOTE — Telephone Encounter (Signed)
He may want to try benadryl po 25 mg and hydrocortisone cream topically OTC for the rash

## 2013-01-26 NOTE — Telephone Encounter (Signed)
Called pt regarding email sent 11/19 - rash to right arm/thigh.leg. Pt unavailable, lmovm for pt to call back. Pt to advise how much gleevac he is currently taking. Informed pt he has a f/u with lab/NP on Tuesday 11/26.

## 2013-01-26 NOTE — Telephone Encounter (Signed)
Per MD notified pt to try benadryl po 25mg  and hydrocortisone cream topically OTC for rash. Pt to keep f/u appt on tues 11/25. Pt verbalized understanding. No further concerns.

## 2013-01-30 ENCOUNTER — Ambulatory Visit (HOSPITAL_BASED_OUTPATIENT_CLINIC_OR_DEPARTMENT_OTHER): Payer: BC Managed Care – PPO | Admitting: Adult Health

## 2013-01-30 ENCOUNTER — Other Ambulatory Visit: Payer: BC Managed Care – PPO | Admitting: Lab

## 2013-01-30 ENCOUNTER — Encounter: Payer: Self-pay | Admitting: Adult Health

## 2013-01-30 ENCOUNTER — Other Ambulatory Visit (HOSPITAL_BASED_OUTPATIENT_CLINIC_OR_DEPARTMENT_OTHER): Payer: BC Managed Care – PPO | Admitting: Lab

## 2013-01-30 VITALS — BP 178/84 | HR 84 | Temp 98.0°F | Resp 18 | Wt 207.7 lb

## 2013-01-30 DIAGNOSIS — C9210 Chronic myeloid leukemia, BCR/ABL-positive, not having achieved remission: Secondary | ICD-10-CM

## 2013-01-30 DIAGNOSIS — D75839 Thrombocytosis, unspecified: Secondary | ICD-10-CM

## 2013-01-30 DIAGNOSIS — D473 Essential (hemorrhagic) thrombocythemia: Secondary | ICD-10-CM

## 2013-01-30 DIAGNOSIS — D72829 Elevated white blood cell count, unspecified: Secondary | ICD-10-CM

## 2013-01-30 LAB — CBC WITH DIFFERENTIAL/PLATELET
BASO%: 3.8 % — ABNORMAL HIGH (ref 0.0–2.0)
Basophils Absolute: 0.2 10*3/uL — ABNORMAL HIGH (ref 0.0–0.1)
EOS%: 3.8 % (ref 0.0–7.0)
Eosinophils Absolute: 0.2 10*3/uL (ref 0.0–0.5)
HCT: 43 % (ref 38.4–49.9)
HGB: 14.5 g/dL (ref 13.0–17.1)
LYMPH%: 23.9 % (ref 14.0–49.0)
MCHC: 33.7 g/dL (ref 32.0–36.0)
MCV: 83.7 fL (ref 79.3–98.0)
MONO#: 0.5 10*3/uL (ref 0.1–0.9)
MONO%: 8.4 % (ref 0.0–14.0)
Platelets: 363 10*3/uL (ref 140–400)
RBC: 5.14 10*6/uL (ref 4.20–5.82)
RDW: 14.5 % (ref 11.0–14.6)
WBC: 5.9 10*3/uL (ref 4.0–10.3)
lymph#: 1.4 10*3/uL (ref 0.9–3.3)
nRBC: 0 % (ref 0–0)

## 2013-01-30 NOTE — Patient Instructions (Signed)
Cetaphil

## 2013-01-30 NOTE — Progress Notes (Signed)
[facility]  Telephone:(336) [phone] Fax:(336) [phone]  OFFICE PROGRESS NOTE   ID: Caio Devera   DOB: 07-19-1950  MR#: [id number]  ZHY#:[id number]   PCP: Pamelia Hoit, MD   DIAGNOSIS:  Moe Graca is a 62 y.o. 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   PRIOR THERAPY: #1 Patient was seen by Dr. Benedetto Goad for routine health maintenance on 08/21/2012. As part of his workup patient had blood work performed including a CBC. He was incidentally found to have an elevated white count and platelet count. Total white count was 42.4 and the platelets were also elevated at 720,000. There were some smudge cells noted. There was neutrophilia. He had a repeat CBC performed on 09/12/2012 which revealed a white count of 67.5 and platelets of 705,000.  Specimen was noted to have metamyelocytes, myelocytes, promyelocytes, nucleated RBCs in neutrophilia. A repeat CBC performed on 10/03/2012 revealed a white count of 76.7, platelets at 604,000 and a continued  left shift.  #2 Bone marrow biopsy/Aspirate on 11/01/2012 showed: Bone Marrow, Aspirate,Biopsy, and Clot, right iliac - CHRONIC MYELOGENOUS LEUKEMIA. PERIPHERAL BLOOD: - CHRONIC MYELOGENOUS LEUKEMIA. Diagnosis Note Previous analysis for BCR/ABL1 performed at [facility] on 10/20/12 is positive as analyzed by quantitative.  RT-PCR technique. Given the latter finding along with the morphology in the bone marrow and peripheral blood, the features are consistent with chronic myelogenous leukemia. (BNS:gt, 11/02/12)  #3 Started takng Gleevec 400 mg PO daily on 11/07/2012.    CURRENT THERAPY: Gleevec 400 mg PO daily.  INTERVAL HISTORY: Harding Thomure is a 62 y.o. male who returns for follow up of CML. He has been doing well lately.  He did call in about a rash last week that has now resolved with cortisone cream.  It lasted about 4 days.  He is taking the Gleevec daily and tolerating it essentially well.  Otherwise, a 10 point ROS  is neg.   MEDICAL HISTORY: Past Medical History  Diagnosis Date  . Leukocytosis, unspecified 10/17/2012  . Thrombocytosis 10/17/2012  . CML (chronic myelocytic leukemia)   . Cataract     Left eye    ALLERGIES:   Allergies  Allergen Reactions  . Corticosteroids Other (See Comments)    Stiffness     MEDICATIONS:  Current Outpatient Prescriptions  Medication Sig Dispense Refill  . Cholecalciferol (VITAMIN D) 2000 UNITS tablet Take 2,000 Units by mouth 2 (two) times daily.      . Coenzyme Q10 (CO Q-10) 100 MG CAPS Take 100 mg by mouth daily.      . fish oil-omega-3 fatty acids 1000 MG capsule Take 1 g by mouth daily.      . Flaxseed, Linseed, (FLAX SEED OIL) 1000 MG CAPS Take 1,000 mg by mouth daily.      . folic acid (FOLVITE) 400 MCG tablet Take 400 mcg by mouth daily.      Marland Kitchen imatinib (GLEEVEC) 400 MG tablet Take 1 tablet (400 mg total) by mouth daily. Take with meals and large glass of water.Caution:Chemotherapy.  90 tablet  5  . OVER THE COUNTER MEDICATION Take 1 capsule by mouth daily. quercetin      . OVER THE COUNTER MEDICATION Take 1 packet by mouth daily. Protease plus      . OVER THE COUNTER MEDICATION Take 2 capsules by mouth 2 (two) times daily. coleus      . OVER THE COUNTER MEDICATION Take 2 capsules by mouth 3 (three) times daily with meals. spurlinina      . OVER  THE COUNTER MEDICATION Take by mouth daily. Black seed oil      . saw palmetto 160 MG capsule Take 160 mg by mouth daily.      . TURMERIC PO Take 2 capsules by mouth 2 (two) times daily.       No current facility-administered medications for this visit.    SURGICAL HISTORY:  Past Surgical History  Procedure Laterality Date  . Joint replacement  2006    Total Knee Left  . Hernia repair    . Tonsillectomy    . Eye surgery  2012    Detached Retina  . Cataract extraction Left 01/01/2013   REVIEW OF SYSTEMS:  A 10 point review of systems was completed and is negative except as noted above.     PHYSICAL EXAMINATION: Blood pressure 178/84, pulse 84, temperature 98 F (36.7 C), temperature source Oral, resp. rate 18, weight 207 lb 10.8 oz (94.2 kg). Body mass index is 26.65 kg/(m^2). General: Patient is a well appearing male in no acute distress HEENT: PERRLA, sclerae anicteric no conjunctival pallor, MMM Neck: supple, no palpable adenopathy Lungs: clear to auscultation bilaterally, no wheezes, rhonchi, or rales Cardiovascular: regular rate rhythm, S1, S2, no murmurs, rubs or gallops Abdomen: Soft, non-tender, non-distended, normoactive bowel sounds, no HSM Extremities: warm and well perfused, no clubbing, cyanosis, or edema Skin: No rashes or lesions, skin is dry on arms due to previous rash Neuro: Non-focal ECOG PERFORMANCE STATUS: 1 - Symptomatic but completely ambulatory   LABORATORY DATA: Lab Results  Component Value Date   WBC 5.9 01/30/2013   HGB 14.5 01/30/2013   HCT 43.0 01/30/2013   MCV 83.7 01/30/2013   PLT 363 01/30/2013      Chemistry      Component Value Date/Time   NA 140 10/17/2012 1246   K 4.2 10/17/2012 1246   CO2 24 10/17/2012 1246   BUN 14.3 10/17/2012 1246   CREATININE 1.1 10/17/2012 1246      Component Value Date/Time   CALCIUM 9.4 10/17/2012 1246   ALKPHOS 59 10/17/2012 1246   AST 26 10/17/2012 1246   ALT 31 10/17/2012 1246   BILITOT 0.78 10/17/2012 1246       RADIOGRAPHIC STUDIES: No results found.   ASSESSMENT: Coe Angelos is a 62 y.o. gentleman diagnosed with CML in 10/2012:  #1 Routine health maintenance monitoring by his PCP on 08/21/2012 showed a CBC with a total white count of 42.4 and platelets were also elevated at 720,000. There were some smudge cells noted. There was neutrophilia. He had a repeat CBC performed on 09/12/2012 which revealed a white count of 67.5 and platelets of 705,000.  Specimen was noted to have metamyelocytes, myelocytes, promyelocytes, nucleated RBCs in neutrophilia. A repeat CBC performed on 10/03/2012  revealed a white count of 76.7, platelets at 604,000 and a continued  left shift.  #2  Bone marrow biopsy from right iliac on 11/01/2012 showed chronic myelogenous leukemia in both the bone marrow aspirate and peripheral blood.  #3  He started therapy with Gleevec 400 mg PO daily on 11/07/2012.     PLAN:  #1  Mr. Elayne Snare is doing well.  His labs are stable and all normal.  His platelets have decreased now to 363 today.  For now he will continue Gleevec 400mg  daily.  I recommended he moisturize his skin daily.  He will return to [place] next month for labs and evaluation.    All questions answered.  Mr. Demetro was encouraged to contact us  in the interim with any questions, concerns, or problems.  I spent 25 minutes counseling the patient face to face.  The total time spent in the appointment was 30 minutes.  Illa Level, NP Medical Oncology [facility] [phone] 01/30/2013  3:07 PM

## 2013-02-06 ENCOUNTER — Telehealth: Payer: Self-pay | Admitting: Oncology

## 2013-02-06 NOTE — Telephone Encounter (Signed)
returned pt's call re changing 12/22 appt. lmonvm for pt to call back to r/s.

## 2013-02-07 ENCOUNTER — Telehealth: Payer: Self-pay | Admitting: Oncology

## 2013-02-13 ENCOUNTER — Other Ambulatory Visit: Payer: BC Managed Care – PPO | Admitting: Lab

## 2013-02-26 ENCOUNTER — Other Ambulatory Visit: Payer: BC Managed Care – PPO | Admitting: Lab

## 2013-02-26 ENCOUNTER — Ambulatory Visit: Payer: BC Managed Care – PPO | Admitting: Adult Health

## 2013-02-27 ENCOUNTER — Other Ambulatory Visit: Payer: BC Managed Care – PPO | Admitting: Lab

## 2013-03-07 ENCOUNTER — Ambulatory Visit (HOSPITAL_BASED_OUTPATIENT_CLINIC_OR_DEPARTMENT_OTHER): Payer: BC Managed Care – PPO | Admitting: Adult Health

## 2013-03-07 ENCOUNTER — Encounter: Payer: Self-pay | Admitting: Adult Health

## 2013-03-07 ENCOUNTER — Ambulatory Visit: Payer: BC Managed Care – PPO

## 2013-03-07 ENCOUNTER — Telehealth: Payer: Self-pay | Admitting: Oncology

## 2013-03-07 ENCOUNTER — Other Ambulatory Visit: Payer: Self-pay | Admitting: Emergency Medicine

## 2013-03-07 ENCOUNTER — Other Ambulatory Visit (HOSPITAL_BASED_OUTPATIENT_CLINIC_OR_DEPARTMENT_OTHER): Payer: BC Managed Care – PPO

## 2013-03-07 VITALS — BP 180/90 | HR 70 | Temp 97.9°F | Resp 18 | Ht 74.0 in | Wt 201.4 lb

## 2013-03-07 DIAGNOSIS — C9210 Chronic myeloid leukemia, BCR/ABL-positive, not having achieved remission: Secondary | ICD-10-CM

## 2013-03-07 DIAGNOSIS — D720 Genetic anomalies of leukocytes: Secondary | ICD-10-CM

## 2013-03-07 LAB — CBC WITH DIFFERENTIAL/PLATELET
BASO%: 4.4 % — ABNORMAL HIGH (ref 0.0–2.0)
Basophils Absolute: 0.2 10*3/uL — ABNORMAL HIGH (ref 0.0–0.1)
HCT: 41.9 % (ref 38.4–49.9)
HGB: 14.3 g/dL (ref 13.0–17.1)
LYMPH%: 23 % (ref 14.0–49.0)
MCHC: 34.1 g/dL (ref 32.0–36.0)
MONO#: 0.3 10*3/uL (ref 0.1–0.9)
MONO%: 8.3 % (ref 0.0–14.0)
NEUT%: 60.4 % (ref 39.0–75.0)
Platelets: 284 10*3/uL (ref 140–400)
WBC: 3.9 10*3/uL — ABNORMAL LOW (ref 4.0–10.3)
lymph#: 0.9 10*3/uL (ref 0.9–3.3)

## 2013-03-07 NOTE — Progress Notes (Signed)
[facility]  Telephone:([phone] Fax:([phone]  OFFICE PROGRESS NOTE   ID: Larry Vasquez   DOB: 17-Feb-1951  MR#: [id number]  JYN#:[id number]   PCP: Pamelia Hoit, MD   DIAGNOSIS:  Larry Vasquez is a 62 y.o. 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   PRIOR THERAPY: #1 Patient was seen by Dr. Benedetto Goad for routine health maintenance on 08/21/2012. As part of his workup patient had blood work performed including a CBC. He was incidentally found to have an elevated white count and platelet count. Total white count was 42.4 and the platelets were also elevated at 720,000. There were some smudge cells noted. There was neutrophilia. He had a repeat CBC performed on 09/12/2012 which revealed a white count of 67.5 and platelets of 705,000.  Specimen was noted to have metamyelocytes, myelocytes, promyelocytes, nucleated RBCs in neutrophilia. A repeat CBC performed on 10/03/2012 revealed a white count of 76.7, platelets at 604,000 and a continued  left shift.  #2 Bone marrow biopsy/Aspirate on 11/01/2012 showed: Bone Marrow, Aspirate,Biopsy, and Clot, right iliac - CHRONIC MYELOGENOUS LEUKEMIA. PERIPHERAL BLOOD: - CHRONIC MYELOGENOUS LEUKEMIA. Diagnosis Note Previous analysis for BCR/ABL1 performed at [facility] on 10/20/12 is positive as analyzed by quantitative.  RT-PCR technique. Given the latter finding along with the morphology in the bone marrow and peripheral blood, the features are consistent with chronic myelogenous leukemia. (BNS:gt, 11/02/12)  #3 Started taking Gleevec 400 mg PO daily on 11/07/2012.    CURRENT THERAPY: Gleevec 400 mg PO daily.  INTERVAL HISTORY: Larry Vasquez is a 62 y.o. male who returns for follow up of CML. He has been doing well lately.  His blood pressure is elevated today.  He takes readings at home and they range from 128-140/70-90.  He states that his blood pressure elevates frequently while at the doctor's office.  He is  tolerating the Gleevec well and has not had any ill effects from the medications.  He denies fevers, chills, night sweats, weight loss, easy bruising/bleeding, or any other concerns.    MEDICAL HISTORY: Past Medical History  Diagnosis Date  . Leukocytosis, unspecified 10/17/2012  . Thrombocytosis 10/17/2012  . CML (chronic myelocytic leukemia)   . Cataract     Left eye    ALLERGIES:   Allergies  Allergen Reactions  . Corticosteroids Other (See Comments)    Stiffness     MEDICATIONS:  Current Outpatient Prescriptions  Medication Sig Dispense Refill  . Cholecalciferol (VITAMIN D) 2000 UNITS tablet Take 2,000 Units by mouth 2 (two) times daily.      . Coenzyme Q10 (CO Q-10) 100 MG CAPS Take 100 mg by mouth daily.      . fish oil-omega-3 fatty acids 1000 MG capsule Take 1 g by mouth daily.      . Flaxseed, Linseed, (FLAX SEED OIL) 1000 MG CAPS Take 1,000 mg by mouth daily.      . folic acid (FOLVITE) 400 MCG tablet Take 400 mcg by mouth daily.      Marland Kitchen imatinib (GLEEVEC) 400 MG tablet Take 1 tablet (400 mg total) by mouth daily. Take with meals and large glass of water.Caution:Chemotherapy.  90 tablet  5  . OVER THE COUNTER MEDICATION Take 1 capsule by mouth daily. quercetin      . OVER THE COUNTER MEDICATION Take 1 packet by mouth daily. Protease plus      . OVER THE COUNTER MEDICATION Take 2 capsules by mouth 2 (two) times daily. coleus      . OVER  THE COUNTER MEDICATION Take 2 capsules by mouth 3 (three) times daily with meals. spurlinina      . OVER THE COUNTER MEDICATION Take by mouth daily. Black seed oil      . saw palmetto 160 MG capsule Take 160 mg by mouth daily.      . TURMERIC PO Take 2 capsules by mouth 2 (two) times daily.      Marland Kitchen allopurinol (ZYLOPRIM) 300 MG tablet       . BESIVANCE 0.6 % SUSP        No current facility-administered medications for this visit.    SURGICAL HISTORY:  Past Surgical History  Procedure Laterality Date  . Joint replacement  2006     Total Knee Left  . Hernia repair    . Tonsillectomy    . Eye surgery  2012    Detached Retina  . Cataract extraction Left 01/01/2013   REVIEW OF SYSTEMS:  A 10 point review of systems was completed and is negative except as noted above.    PHYSICAL EXAMINATION: Blood pressure 180/90, pulse 70, temperature 97.9 F (36.6 C), temperature source Oral, resp. rate 18, height 6\' 2"  (1.88 m), weight 201 lb 6.4 oz (91.354 kg). Body mass index is 25.85 kg/(m^2). GENERAL: Patient is a well appearing male in no acute distress HEENT:  Sclerae anicteric.  Oropharynx clear and moist. No ulcerations or evidence of oropharyngeal candidiasis. Neck is supple.  NODES:  No cervical, supraclavicular, or axillary lymphadenopathy palpated.  LUNGS:  Clear to auscultation bilaterally.  No wheezes or rhonchi. HEART:  Regular rate and rhythm. No murmur appreciated. ABDOMEN:  Soft, nontender.  Positive, normoactive bowel sounds. No organomegaly palpated. MSK:  No focal spinal tenderness to palpation. Full range of motion bilaterally in the upper extremities. EXTREMITIES:  No peripheral edema.   SKIN:  Clear with no obvious rashes or skin changes. No nail dyscrasia. NEURO:  Nonfocal. Well oriented.  Appropriate affect. ECOG PERFORMANCE STATUS: 1 - Symptomatic but completely ambulatory   LABORATORY DATA: Lab Results  Component Value Date   WBC 3.9* 03/07/2013   HGB 14.3 03/07/2013   HCT 41.9 03/07/2013   MCV 81.5 03/07/2013   PLT 284 03/07/2013      Chemistry      Component Value Date/Time   NA 140 10/17/2012 1246   K 4.2 10/17/2012 1246   CO2 24 10/17/2012 1246   BUN 14.3 10/17/2012 1246   CREATININE 1.1 10/17/2012 1246      Component Value Date/Time   CALCIUM 9.4 10/17/2012 1246   ALKPHOS 59 10/17/2012 1246   AST 26 10/17/2012 1246   ALT 31 10/17/2012 1246   BILITOT 0.78 10/17/2012 1246       RADIOGRAPHIC STUDIES: No results found.   ASSESSMENT: Larry Vasquez is a 62 y.o. gentleman diagnosed  with CML in 10/2012:  #1 Routine health maintenance monitoring by his PCP on 08/21/2012 showed a CBC with a total white count of 42.4 and platelets were also elevated at 720,000. There were some smudge cells noted. There was neutrophilia. He had a repeat CBC performed on 09/12/2012 which revealed a white count of 67.5 and platelets of 705,000.  Specimen was noted to have metamyelocytes, myelocytes, promyelocytes, nucleated RBCs in neutrophilia. A repeat CBC performed on 10/03/2012 revealed a white count of 76.7, platelets at 604,000 and a continued  left shift.  #2  Bone marrow biopsy from right iliac on 11/01/2012 showed chronic myelogenous leukemia in both the bone marrow aspirate and peripheral  blood.  #3  He started therapy with Gleevec 400 mg PO daily on 11/07/2012.     PLAN:  #1  Larry Vasquez is doing well.  His labs are stable and all normal.  His platelets have decreased now to 284 today.  For now he will continue Gleevec 400mg  daily.   #2 We will add on a BCR-ABL today to evaluate his progress.    #3  He will return on 03/26/13 for labs, and evaluation, as well as to discuss his BCR-ABL results.    All questions answered.  Larry Vasquez was encouraged to contact us in the interim with any questions, concerns, or problems.  I spent 25 minutes counseling the patient face to face.  The total time spent in the appointment was 30 minutes.  Illa Level, NP Medical Oncology [facility] [phone] 03/08/2013  12:53 PM

## 2013-03-07 NOTE — Telephone Encounter (Signed)
, °

## 2013-03-09 ENCOUNTER — Other Ambulatory Visit (HOSPITAL_BASED_OUTPATIENT_CLINIC_OR_DEPARTMENT_OTHER): Payer: BC Managed Care – PPO

## 2013-03-09 DIAGNOSIS — C9210 Chronic myeloid leukemia, BCR/ABL-positive, not having achieved remission: Secondary | ICD-10-CM

## 2013-03-09 LAB — MANUAL DIFFERENTIAL
ALC: 0.9 10*3/uL (ref 0.9–3.3)
ANC (CHCC manual diff): 1.8 10*3/uL (ref 1.5–6.5)
BLASTS: 0 % (ref 0–0)
Band Neutrophils: 0 % (ref 0–10)
Basophil: 13 % — ABNORMAL HIGH (ref 0–2)
EOS: 1 % (ref 0–7)
LYMPH: 24 % (ref 14–49)
MONO: 14 % (ref 0–14)
MYELOCYTES: 0 % (ref 0–0)
Metamyelocytes: 0 % (ref 0–0)
OTHER CELL: 0 % (ref 0–0)
PLT EST: ADEQUATE
PROMYELO: 0 % (ref 0–0)
SEG: 48 % (ref 38–77)
VARIANT LYMPH: 0 % (ref 0–0)
nRBC: 0 % (ref 0–0)

## 2013-03-09 LAB — CBC WITH DIFFERENTIAL/PLATELET
HCT: 44.5 % (ref 38.4–49.9)
HGB: 14.9 g/dL (ref 13.0–17.1)
MCH: 27.4 pg (ref 27.2–33.4)
MCHC: 33.5 g/dL (ref 32.0–36.0)
MCV: 82 fL (ref 79.3–98.0)
Platelets: 308 10*3/uL (ref 140–400)
RBC: 5.43 10*6/uL (ref 4.20–5.82)
RDW: 15.1 % — ABNORMAL HIGH (ref 11.0–14.6)
WBC: 3.8 10*3/uL — AB (ref 4.0–10.3)

## 2013-03-26 ENCOUNTER — Encounter: Payer: Self-pay | Admitting: Adult Health

## 2013-03-26 ENCOUNTER — Other Ambulatory Visit (HOSPITAL_BASED_OUTPATIENT_CLINIC_OR_DEPARTMENT_OTHER): Payer: BC Managed Care – PPO

## 2013-03-26 ENCOUNTER — Ambulatory Visit (HOSPITAL_BASED_OUTPATIENT_CLINIC_OR_DEPARTMENT_OTHER): Payer: BC Managed Care – PPO | Admitting: Adult Health

## 2013-03-26 VITALS — BP 166/95 | HR 80 | Temp 98.6°F | Resp 20 | Ht 74.0 in | Wt 207.7 lb

## 2013-03-26 DIAGNOSIS — C9210 Chronic myeloid leukemia, BCR/ABL-positive, not having achieved remission: Secondary | ICD-10-CM

## 2013-03-26 DIAGNOSIS — I10 Essential (primary) hypertension: Secondary | ICD-10-CM

## 2013-03-26 LAB — CBC WITH DIFFERENTIAL/PLATELET
BASO%: 6.9 % — AB (ref 0.0–2.0)
BASOS ABS: 0.3 10*3/uL — AB (ref 0.0–0.1)
EOS ABS: 0.2 10*3/uL (ref 0.0–0.5)
EOS%: 3.4 % (ref 0.0–7.0)
HEMATOCRIT: 44.6 % (ref 38.4–49.9)
HEMOGLOBIN: 15.3 g/dL (ref 13.0–17.1)
LYMPH#: 1.1 10*3/uL (ref 0.9–3.3)
LYMPH%: 25 % (ref 14.0–49.0)
MCH: 27.6 pg (ref 27.2–33.4)
MCHC: 34.3 g/dL (ref 32.0–36.0)
MCV: 80.4 fL (ref 79.3–98.0)
MONO#: 0.3 10*3/uL (ref 0.1–0.9)
MONO%: 6.9 % (ref 0.0–14.0)
NEUT#: 2.5 10*3/uL (ref 1.5–6.5)
NEUT%: 57.8 % (ref 39.0–75.0)
Platelets: 336 10*3/uL (ref 140–400)
RBC: 5.55 10*6/uL (ref 4.20–5.82)
RDW: 15.7 % — ABNORMAL HIGH (ref 11.0–14.6)
WBC: 4.4 10*3/uL (ref 4.0–10.3)
nRBC: 0 % (ref 0–0)

## 2013-03-26 NOTE — Progress Notes (Signed)
[place]  [phone] [phone]  OFFICE PROGRESS NOTE   ID: Larry Vasquez   DOB: 07-31-1950  MR#: [id number]  IZT#:[id number]   PCP: Woody Seller, MD   DIAGNOSIS:  Larry Vasquez is a 63 y.o. 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   PRIOR THERAPY: #1 Patient was seen by Dr. Kathryne Eriksson for routine health maintenance on 08/21/2012. As part of his workup patient had blood work performed including a CBC. He was incidentally found to have an elevated white count and platelet count. Total white count was 42.4 and the platelets were also elevated at 720,000. There were some smudge cells noted. There was neutrophilia. He had a repeat CBC performed on 09/12/2012 which revealed a white count of 67.5 and platelets of 705,000.  Specimen was noted to have metamyelocytes, myelocytes, promyelocytes, nucleated RBCs in neutrophilia. A repeat CBC performed on 10/03/2012 revealed a white count of 76.7, platelets at 604,000 and a continued  left shift.  #2 Bone marrow biopsy/Aspirate on 11/01/2012 showed: Bone Marrow, Aspirate,Biopsy, and Clot, right iliac - CHRONIC MYELOGENOUS LEUKEMIA. PERIPHERAL BLOOD: - CHRONIC MYELOGENOUS LEUKEMIA. Diagnosis Note Previous analysis for BCR/ABL1 performed at [facility] on 10/20/12 is positive as analyzed by quantitative.  RT-PCR technique. Given the latter finding along with the morphology in the bone marrow and peripheral blood, the features are consistent with chronic myelogenous leukemia. (BNS:gt, 11/02/12)  #3 Started taking Gleevec 400 mg PO daily on 11/07/2012.    CURRENT THERAPY: Gleevec 400 mg PO daily.  INTERVAL HISTORY: Larry Vasquez is a 63 y.o. male who returns for follow up of CML. He has been doing well lately.  He continues to follow his BP and his readings have occasionally been 140s over 90s.  He has noticed a slight increase in his gums bleeding with flossing.  He denies headaches, vision changes,  numbness, weakness, fever, chills, night sweats, or any other concerns.    MEDICAL HISTORY: Past Medical History  Diagnosis Date  . Leukocytosis, unspecified 10/17/2012  . Thrombocytosis 10/17/2012  . CML (chronic myelocytic leukemia)   . Cataract     Left eye    ALLERGIES:   Allergies  Allergen Reactions  . Corticosteroids Other (See Comments)    Stiffness     MEDICATIONS:  Current Outpatient Prescriptions  Medication Sig Dispense Refill  . Cholecalciferol (VITAMIN D) 2000 UNITS tablet Take 2,000 Units by mouth 2 (two) times daily.      . Coenzyme Q10 (CO Q-10) 100 MG CAPS Take 100 mg by mouth daily.      . fish oil-omega-3 fatty acids 1000 MG capsule Take 1 g by mouth daily.      . Flaxseed, Linseed, (FLAX SEED OIL) 1000 MG CAPS Take 1,000 mg by mouth daily.      Marland Kitchen imatinib (GLEEVEC) 400 MG tablet Take 1 tablet (400 mg total) by mouth daily. Take with meals and large glass of water.Caution:Chemotherapy.  90 tablet  5  . OVER THE COUNTER MEDICATION Take 1 capsule by mouth daily. quercetin      . OVER THE COUNTER MEDICATION Take 1 packet by mouth daily. Protease plus      . OVER THE COUNTER MEDICATION Take 2 capsules by mouth 2 (two) times daily. coleus      . OVER THE COUNTER MEDICATION Take by mouth daily. Black seed oil      . saw palmetto 160 MG capsule Take 160 mg by mouth daily.      . TURMERIC PO Take 2  capsules by mouth 2 (two) times daily.      . folic acid (FOLVITE) 725 MCG tablet Take 400 mcg by mouth daily.      Marland Kitchen OVER THE COUNTER MEDICATION Take 2 capsules by mouth 3 (three) times daily with meals. spurlinina       No current facility-administered medications for this visit.    SURGICAL HISTORY:  Past Surgical History  Procedure Laterality Date  . Joint replacement  2006    Total Knee Left  . Hernia repair    . Tonsillectomy    . Eye surgery  2012    Detached Retina  . Cataract extraction Left 01/01/2013   REVIEW OF SYSTEMS:  A 10 point review of systems  was completed and is negative except as noted above.    PHYSICAL EXAMINATION: Blood pressure 166/95, pulse 80, temperature 98.6 F (37 C), temperature source Oral, resp. rate 20, height 6' 2"  (1.88 m), weight 207 lb 11.2 oz (94.212 kg). Body mass index is 26.66 kg/(m^2). GENERAL: Patient is a well appearing male in no acute distress HEENT:  Sclerae anicteric.  Oropharynx clear and moist. No ulcerations or evidence of oropharyngeal candidiasis. Neck is supple.  NODES:  No cervical, supraclavicular, or axillary lymphadenopathy palpated.  LUNGS:  Clear to auscultation bilaterally.  No wheezes or rhonchi. HEART:  Regular rate and rhythm. No murmur appreciated. ABDOMEN:  Soft, nontender.  Positive, normoactive bowel sounds. No organomegaly palpated. MSK:  No focal spinal tenderness to palpation. Full range of motion bilaterally in the upper extremities. EXTREMITIES:  No peripheral edema.   SKIN:  Clear with no obvious rashes or skin changes. No nail dyscrasia. NEURO:  Nonfocal. Well oriented.  Appropriate affect. ECOG PERFORMANCE STATUS: 1 - Symptomatic but completely ambulatory   LABORATORY DATA: Lab Results  Component Value Date   WBC 4.4 03/26/2013   HGB 15.3 03/26/2013   HCT 44.6 03/26/2013   MCV 80.4 03/26/2013   PLT 336 03/26/2013      Chemistry      Component Value Date/Time   NA 140 10/17/2012 1246   K 4.2 10/17/2012 1246   CO2 24 10/17/2012 1246   BUN 14.3 10/17/2012 1246   CREATININE 1.1 10/17/2012 1246      Component Value Date/Time   CALCIUM 9.4 10/17/2012 1246   ALKPHOS 59 10/17/2012 1246   AST 26 10/17/2012 1246   ALT 31 10/17/2012 1246   BILITOT 0.78 10/17/2012 1246       RADIOGRAPHIC STUDIES: No results found.   ASSESSMENT: Larry Vasquez is a 63 y.o. gentleman diagnosed with CML in 10/2012:  #1 Routine health maintenance monitoring by his PCP on 08/21/2012 showed a CBC with a total white count of 42.4 and platelets were also elevated at 720,000. There were some  smudge cells noted. There was neutrophilia. He had a repeat CBC performed on 09/12/2012 which revealed a white count of 67.5 and platelets of 705,000.  Specimen was noted to have metamyelocytes, myelocytes, promyelocytes, nucleated RBCs in neutrophilia. A repeat CBC performed on 10/03/2012 revealed a white count of 76.7, platelets at 604,000 and a continued  left shift.  #2  Bone marrow biopsy from right iliac on 11/01/2012 showed chronic myelogenous leukemia in both the bone marrow aspirate and peripheral blood.  #3  He started therapy with Gleevec 400 mg PO daily on 11/07/2012.     PLAN:  #1  Mr. Worthy Rancher is doing well.  His labs remain stable. He did have a BCRABL drawn which demonstrated approximately  1 million copies, which is improved as compared to around 2 million copies.  These results were discussed with Dr. Humphrey Rolls, and he will continue Gleevec 458m daily.   #2 He will return in one month for labs and evaluation. I continue to recommend f/u with his PCP regarding his hypertension.    All questions answered.  Mr. SParkersonwas encouraged to contact uKoreain the interim with any questions, concerns, or problems.  I spent 15 minutes counseling the patient face to face.  The total time spent in the appointment was 30 minutes.  LMinette Headland [phone]/19/2015  9:47 AM

## 2013-04-23 ENCOUNTER — Ambulatory Visit: Payer: BC Managed Care – PPO | Admitting: Adult Health

## 2013-04-23 ENCOUNTER — Other Ambulatory Visit: Payer: BC Managed Care – PPO

## 2013-04-27 ENCOUNTER — Ambulatory Visit (HOSPITAL_BASED_OUTPATIENT_CLINIC_OR_DEPARTMENT_OTHER): Payer: BC Managed Care – PPO | Admitting: Oncology

## 2013-04-27 ENCOUNTER — Encounter: Payer: Self-pay | Admitting: Oncology

## 2013-04-27 ENCOUNTER — Other Ambulatory Visit (HOSPITAL_BASED_OUTPATIENT_CLINIC_OR_DEPARTMENT_OTHER): Payer: BC Managed Care – PPO

## 2013-04-27 ENCOUNTER — Telehealth: Payer: Self-pay | Admitting: *Deleted

## 2013-04-27 VITALS — BP 168/95 | HR 73 | Temp 98.2°F | Resp 20 | Ht 76.0 in | Wt 208.3 lb

## 2013-04-27 DIAGNOSIS — C9210 Chronic myeloid leukemia, BCR/ABL-positive, not having achieved remission: Secondary | ICD-10-CM

## 2013-04-27 DIAGNOSIS — D720 Genetic anomalies of leukocytes: Secondary | ICD-10-CM

## 2013-04-27 DIAGNOSIS — I10 Essential (primary) hypertension: Secondary | ICD-10-CM

## 2013-04-27 LAB — CBC WITH DIFFERENTIAL/PLATELET
BASO%: 13.4 % — ABNORMAL HIGH (ref 0.0–2.0)
BASOS ABS: 1.3 10*3/uL — AB (ref 0.0–0.1)
EOS%: 4.1 % (ref 0.0–7.0)
Eosinophils Absolute: 0.4 10*3/uL (ref 0.0–0.5)
HEMATOCRIT: 44.6 % (ref 38.4–49.9)
HGB: 15.3 g/dL (ref 13.0–17.1)
LYMPH#: 1.8 10*3/uL (ref 0.9–3.3)
LYMPH%: 17.6 % (ref 14.0–49.0)
MCH: 27.9 pg (ref 27.2–33.4)
MCHC: 34.3 g/dL (ref 32.0–36.0)
MCV: 81.2 fL (ref 79.3–98.0)
MONO#: 0.6 10*3/uL (ref 0.1–0.9)
MONO%: 5.9 % (ref 0.0–14.0)
NEUT%: 59 % (ref 39.0–75.0)
NEUTROS ABS: 5.9 10*3/uL (ref 1.5–6.5)
Platelets: 287 10*3/uL (ref 140–400)
RBC: 5.49 10*6/uL (ref 4.20–5.82)
RDW: 17.7 % — AB (ref 11.0–14.6)
WBC: 10 10*3/uL (ref 4.0–10.3)
nRBC: 0 % (ref 0–0)

## 2013-04-27 LAB — TECHNOLOGIST REVIEW

## 2013-04-27 NOTE — Patient Instructions (Signed)
Imatinib tablets What is this medicine? IMATINIB (i MAT in ib) is a chemotherapy drug. It targets a specific protein within cancer cells and stops the cancer cells from growing. It is used to treat certain leukemias, myelodysplastic syndromes, and other cancers. It is also used to treat specific digestive tract tumors called GISTs. This medicine may be used for other purposes; ask your health care provider or pharmacist if you have questions. COMMON BRAND NAME(S): Gleevec What should I tell my health care provider before I take this medicine? They need to know if you have any of these conditions: -bleeding problems -infection (especially a virus infection such as chickenpox, cold sores, or herpes) -heart disease -heart failure -kidney disease -liver disease -lung disease -stomach problems -an unusual or allergic reaction to imatinib, other medicines, foods, dyes, or preservatives -pregnant or trying to get pregnant -breast-feeding How should I use this medicine? Take this medicine by mouth with a full glass of water. Take it with food to decrease the chance of it upsetting your stomach. Do not take with grapefruit juice. Follow the directions on the prescription label. Take your medicine at regular intervals. Do not take it more often than directed. Do not stop taking except on your doctor's advice. If you have difficulty swallowing the tablets, let your doctor, pharmacist, or health care professional know. They can help you with advice. Talk to your pediatrician regarding the use of this medicine in children. While this drug may be prescribed for children as young as 1 year for selected conditions, precautions do apply. Overdosage: If you think you have taken too much of this medicine contact a poison control center or emergency room at once. NOTE: This medicine is only for you. Do not share this medicine with others. What if I miss a dose? If you miss a dose, take it as soon as you can. If  it is almost time for your next dose, take only that dose and skip your missed dose. Do not take double or extra doses. What may interact with this medicine? -antiviral medicines for HIV or AIDS -bosentan -cisapride -clarithromycin -cyclosporine -dexamethasone -diltiazem -ergot alkaloids like dihydroergotamine, ergonovine, ergotamine, methylergonovine -erythromycin -grapefruit or grapefruit juice -medicines for cholesterol like atorvastatin lovastatin, simvastatin -medicines for depression, anxiety, or psychotic disturbances -medicines for fungal infections like ketoconazole and itraconazole -medicines for irregular heart beat like amiodarone, bepridil, dofetilide, encainide, flecainide, propafenone, quinidine -medicines for seizures like carbamazepine, phenobarbital, phenytoin -medicines for sleep -NSAIDS, medicines for pain and inflammation, like ibuprofen or naproxen -pimozide -rifabutin -rifampin -sildenafil -sirolimus -St. John's wort -tacrolimus -vaccines -verapamil -warfarin Talk to your doctor or health care professional before taking any of these medicines: -acetaminophen -aspirin -ibuprofen -ketoprofen -naproxen This list may not describe all possible interactions. Give your health care provider a list of all the medicines, herbs, non-prescription drugs, or dietary supplements you use. Also tell them if you smoke, drink alcohol, or use illegal drugs. Some items may interact with your medicine. What should I watch for while using this medicine? Visit your doctor for checks on your progress. You will need to have regular blood tests while on this medicine. Report any new symptoms promptly. Call your doctor or health care professional for advice if you get a fever, chills or sore throat, or other symptoms of a cold or flu. Do not treat yourself. This drug decreases your body's ability to fight infections. Try to avoid being around people who are sick. This medicine may  increase your risk to bruise or  bleed. Call your doctor or health care professional if you notice any unusual bleeding. Be careful brushing and flossing your teeth or using a toothpick because you may get an infection or bleed more easily. If you have any dental work done, tell your dentist you are receiving this medicine. Avoid taking products that contain aspirin, acetaminophen, ibuprofen, naproxen, or ketoprofen unless instructed by your doctor. These medicines may hide a fever. Do not become pregnant while taking this medicine. Women should inform their doctor if they wish to become pregnant or think they might be pregnant. There is a potential for serious side effects to an unborn child. Men should inform their doctors if they wish to father a child. This medicine may lower sperm counts. Talk to your health care professional or pharmacist for more information. Do not breast-feed an infant while taking this medicine. What side effects may I notice from receiving this medicine? Side effects that you should report to your doctor or health care professional as soon as possible: -low blood counts - this medicine may decrease the number of white blood cells, red blood cells and platelets. You may be at increased risk for infections and bleeding. -signs of infection - fever or chills, cough, sore throat, pain or difficulty passing urine -signs of decreased platelets or bleeding - bruising, pinpoint red spots on the skin, black, tarry stools, blood in the urine, nosebleeds -signs of decreased red blood cells - unusually weak or tired, fainting spells, lightheadedness -allergic reactions like skin rash, itching or hives, swelling of the face, lips, or tongue -breathing problems -changes in vision -dark urine -general ill feeling or flu-like symptoms -light-colored stools -loss of appetite -mouth sores -redness, blistering, peeling or loosening of the skin, including inside the mouth -right upper belly  pain -swelling of the legs or ankles -trouble passing urine or change in the amount of urine -vomiting -yellowing of the eyes or skin Side effects that usually do not require medical attention (report to your doctor or health care professional if they continue or are bothersome): -decreased appetite -diarrhea -difficulty sleeping -headache -heartburn -joint pain -muscle cramps or pain -nausea -upset stomach This list may not describe all possible side effects. Call your doctor for medical advice about side effects. You may report side effects to FDA at 1-800-FDA-1088. Where should I keep my medicine? Keep out of reach of children. Store tablets at room temperature between 15 and 30 degrees C (59 and 86 degrees F). Protect from moisture. Keep tightly closed. Throw away any unused medicine after the expiration date. NOTE: This sheet is a summary. It may not cover all possible information. If you have questions about this medicine, talk to your doctor, pharmacist, or health care provider.  2014, [employer]/Gold Standard. (2011-04-13 11:00:30)

## 2013-04-27 NOTE — Telephone Encounter (Signed)
appts made and printed...td 

## 2013-04-29 NOTE — Progress Notes (Signed)
[place]  [phone]) [phone] [phone]  OFFICE PROGRESS NOTE   ID: Zacarias Krauter   DOB: 1950-08-01  MR#: [id number]  DZH#:[id number]   PCP: Woody Seller, MD   DIAGNOSIS:  Larry Vasquez is a 63 y.o. 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   PRIOR THERAPY: #1 Patient was seen by Dr. Kathryne Eriksson for routine health maintenance on 08/21/2012. As part of his workup patient had blood work performed including a CBC. He was incidentally found to have an elevated white count and platelet count. Total white count was 42.4 and the platelets were also elevated at 720,000. There were some smudge cells noted. There was neutrophilia. He had a repeat CBC performed on 09/12/2012 which revealed a white count of 67.5 and platelets of 705,000.  Specimen was noted to have metamyelocytes, myelocytes, promyelocytes, nucleated RBCs in neutrophilia. A repeat CBC performed on 10/03/2012 revealed a white count of 76.7, platelets at 604,000 and a continued  left shift.  #2 Bone marrow biopsy/Aspirate on 11/01/2012 showed: Bone Marrow, Aspirate,Biopsy, and Clot, right iliac - CHRONIC MYELOGENOUS LEUKEMIA. PERIPHERAL BLOOD: - CHRONIC MYELOGENOUS LEUKEMIA. Diagnosis Note Previous analysis for BCR/ABL1 performed at [facility] on 10/20/12 is positive as analyzed by quantitative.  RT-PCR technique. Given the latter finding along with the morphology in the bone marrow and peripheral blood, the features are consistent with chronic myelogenous leukemia. (BNS:gt, 11/02/12)  #3 Started taking Gleevec 400 mg PO daily on 11/07/2012. Duration of therapy indefinite.   CURRENT THERAPY: Gleevec 400 mg PO daily.  INTERVAL HISTORY: Larry Vasquez is a 63 y.o. male who returns for follow up of CML. He has been doing well lately.  His BP is slightly elevated. We discussed that he see his PCP to try to get medications adjusted.  He denies headaches, vision changes, numbness, weakness, fever, chills,  night sweats, or any other concerns.  No chest pains or palpitations. No bleeding or easy bruising. Remainder of the 10 point review of systems is negative.  MEDICAL HISTORY: Past Medical History  Diagnosis Date  . Leukocytosis, unspecified 10/17/2012  . Thrombocytosis 10/17/2012  . CML (chronic myelocytic leukemia)   . Cataract     Left eye    ALLERGIES:   Allergies  Allergen Reactions  . Corticosteroids Other (See Comments)    Stiffness     MEDICATIONS:  Current Outpatient Prescriptions  Medication Sig Dispense Refill  . Cholecalciferol (VITAMIN D) 2000 UNITS tablet Take 2,000 Units by mouth 2 (two) times daily.      . Coenzyme Q10 (CO Q-10) 100 MG CAPS Take 100 mg by mouth daily.      . fish oil-omega-3 fatty acids 1000 MG capsule Take 1 g by mouth daily.      Marland Kitchen imatinib (GLEEVEC) 400 MG tablet Take 1 tablet (400 mg total) by mouth daily. Take with meals and large glass of water.Caution:Chemotherapy.  90 tablet  5  . OVER THE COUNTER MEDICATION Take 1 capsule by mouth daily. quercetin      . OVER THE COUNTER MEDICATION Take 1 packet by mouth daily. Protease plus      . OVER THE COUNTER MEDICATION Take 2 capsules by mouth 2 (two) times daily. coleus      . OVER THE COUNTER MEDICATION Take 2 capsules by mouth 3 (three) times daily with meals. spurlinina      . OVER THE COUNTER MEDICATION Take by mouth daily. Black seed oil      . saw palmetto 160 MG capsule Take 160  mg by mouth daily.      . TURMERIC PO Take 2 capsules by mouth 2 (two) times daily.       No current facility-administered medications for this visit.    SURGICAL HISTORY:  Past Surgical History  Procedure Laterality Date  . Joint replacement  2006    Total Knee Left  . Hernia repair    . Tonsillectomy    . Eye surgery  2012    Detached Retina  . Cataract extraction Left 01/01/2013   REVIEW OF SYSTEMS:  A 10 point review of systems was completed and is negative except as noted above.    PHYSICAL  EXAMINATION: Blood pressure 168/95, pulse 73, temperature 98.2 F (36.8 C), temperature source Oral, resp. rate 20, height _0  (1.93 m), weight 208 lb 4.8 oz (94.484 kg). Body mass index is 25.37 kg/(m^2). GENERAL: Patient is a well appearing male in no acute distress HEENT:  Sclerae anicteric.  Oropharynx clear and moist. No ulcerations or evidence of oropharyngeal candidiasis. Neck is supple.  NODES:  No cervical, supraclavicular, or axillary lymphadenopathy palpated.  LUNGS:  Clear to auscultation bilaterally.  No wheezes or rhonchi. HEART:  Regular rate and rhythm. No murmur appreciated. ABDOMEN:  Soft, nontender.  Positive, normoactive bowel sounds. No organomegaly palpated. MSK:  No focal spinal tenderness to palpation. Full range of motion bilaterally in the upper extremities. EXTREMITIES:  No peripheral edema.   SKIN:  Clear with no obvious rashes or skin changes. No nail dyscrasia. NEURO:  Nonfocal. Well oriented.  Appropriate affect. ECOG PERFORMANCE STATUS: 1 - Symptomatic but completely ambulatory   LABORATORY DATA: Lab Results  Component Value Date   WBC 10.0 04/27/2013   HGB 15.3 04/27/2013   HCT 44.6 04/27/2013   MCV 81.2 04/27/2013   PLT 287 04/27/2013      Chemistry      Component Value Date/Time   NA 140 10/17/2012 1246   K 4.2 10/17/2012 1246   CO2 24 10/17/2012 1246   BUN 14.3 10/17/2012 1246   CREATININE 1.1 10/17/2012 1246      Component Value Date/Time   CALCIUM 9.4 10/17/2012 1246   ALKPHOS 59 10/17/2012 1246   AST 26 10/17/2012 1246   ALT 31 10/17/2012 1246   BILITOT 0.78 10/17/2012 1246       RADIOGRAPHIC STUDIES: No results found.   ASSESSMENT/PLAN: Larry Vasquez is a 63 y.o. gentleman diagnosed with CML in 10/2012:  #1 Routine health maintenance monitoring by his PCP on 08/21/2012 showed a CBC with a total white count of 42.4 and platelets were also elevated at 720,000. There were some smudge cells noted. There was neutrophilia. He had a repeat CBC  performed on 09/12/2012 which revealed a white count of 67.5 and platelets of 705,000.  Specimen was noted to have metamyelocytes, myelocytes, promyelocytes, nucleated RBCs in neutrophilia. A repeat CBC performed on 10/03/2012 revealed a white count of 76.7, platelets at 604,000 and a continued  left shift.  #2  Bone marrow biopsy from right iliac on 11/01/2012 showed chronic myelogenous leukemia in both the bone marrow aspirate and peripheral blood.  #3  He started therapy with Gleevec 400 mg PO daily on 11/07/2012.  He has normalized his counts with the gleevec. We will continue gleevec at present dose.  #4. We will repeat peripheral blood BCR/ABL testing every 6 months. We discussed the rational for this.  5. Hypertension; continue current meds but he is advised about following up with his PCP regarding control of  hypertension.  5. Follow up: I will see patient back in 3 months with blood work. He knows to call me with any problems    All questions answered.  Mr. Lave was encouraged to contact us in the interim with any questions, concerns, or problems.  I spent 20 minutes counseling the patient face to face.  The total time spent in the appointment was 30 minutes.   Marcy Panning, MD Medical/Oncology [facility] [phone] (beeper) [phone] (Office)

## 2013-07-31 ENCOUNTER — Other Ambulatory Visit: Payer: BC Managed Care – PPO

## 2013-07-31 ENCOUNTER — Ambulatory Visit: Payer: BC Managed Care – PPO | Admitting: Adult Health

## 2013-08-02 ENCOUNTER — Ambulatory Visit (HOSPITAL_BASED_OUTPATIENT_CLINIC_OR_DEPARTMENT_OTHER): Payer: BC Managed Care – PPO | Admitting: Adult Health

## 2013-08-02 ENCOUNTER — Ambulatory Visit: Payer: BC Managed Care – PPO

## 2013-08-02 ENCOUNTER — Other Ambulatory Visit: Payer: Self-pay | Admitting: Hematology and Oncology

## 2013-08-02 ENCOUNTER — Encounter (HOSPITAL_BASED_OUTPATIENT_CLINIC_OR_DEPARTMENT_OTHER): Payer: BC Managed Care – PPO | Admitting: Adult Health

## 2013-08-02 ENCOUNTER — Telehealth: Payer: Self-pay | Admitting: Adult Health

## 2013-08-02 ENCOUNTER — Encounter: Payer: Self-pay | Admitting: Adult Health

## 2013-08-02 VITALS — BP 165/85 | HR 85 | Temp 97.9°F | Resp 20 | Ht 76.0 in | Wt 205.0 lb

## 2013-08-02 DIAGNOSIS — C9210 Chronic myeloid leukemia, BCR/ABL-positive, not having achieved remission: Secondary | ICD-10-CM

## 2013-08-02 LAB — COMPREHENSIVE METABOLIC PANEL (CC13)
ALBUMIN: 3.7 g/dL (ref 3.5–5.0)
ALT: 18 U/L (ref 0–55)
AST: 26 U/L (ref 5–34)
Alkaline Phosphatase: 57 U/L (ref 40–150)
Anion Gap: 10 mEq/L (ref 3–11)
BUN: 15.6 mg/dL (ref 7.0–26.0)
CHLORIDE: 106 meq/L (ref 98–109)
CO2: 24 mEq/L (ref 22–29)
Calcium: 8.7 mg/dL (ref 8.4–10.4)
Creatinine: 1.1 mg/dL (ref 0.7–1.3)
Glucose: 121 mg/dl (ref 70–140)
POTASSIUM: 4.1 meq/L (ref 3.5–5.1)
Sodium: 140 mEq/L (ref 136–145)
Total Bilirubin: 0.59 mg/dL (ref 0.20–1.20)
Total Protein: 6.4 g/dL (ref 6.4–8.3)

## 2013-08-02 LAB — CBC WITH DIFFERENTIAL/PLATELET
HCT: 38.3 % — ABNORMAL LOW (ref 38.4–49.9)
HGB: 13 g/dL (ref 13.0–17.1)
MCH: 30.2 pg (ref 27.2–33.4)
MCHC: 33.9 g/dL (ref 32.0–36.0)
MCV: 88.9 fL (ref 79.3–98.0)
Platelets: 294 10*3/uL (ref 140–400)
RBC: 4.31 10*6/uL (ref 4.20–5.82)
RDW: 19.4 % — AB (ref 11.0–14.6)
WBC: 103 10*3/uL — AB (ref 4.0–10.3)

## 2013-08-02 LAB — MANUAL DIFFERENTIAL
ALC: 4.1 10*3/uL — ABNORMAL HIGH (ref 0.9–3.3)
ANC (CHCC manual diff): 88.6 10*3/uL — ABNORMAL HIGH (ref 1.5–6.5)
Band Neutrophils: 23 % — ABNORMAL HIGH (ref 0–10)
Basophil: 5 % — ABNORMAL HIGH (ref 0–2)
Blasts: 1 % — ABNORMAL HIGH (ref 0–0)
EOS%: 4 % (ref 0–7)
LYMPH: 4 % — AB (ref 14–49)
METAMYELOCYTES PCT: 19 % — AB (ref 0–0)
MONO: 0 % (ref 0–14)
MYELOCYTES: 10 % — AB (ref 0–0)
OTHER CELL: 0 % (ref 0–0)
PLT EST: ADEQUATE
PROMYELO: 0 % (ref 0–0)
SEG: 34 % — AB (ref 38–77)
Variant Lymph: 0 % (ref 0–0)
nRBC: 1 % — ABNORMAL HIGH (ref 0–0)

## 2013-08-02 NOTE — Progress Notes (Signed)
Larry Vasquez  Telephone:(336) [phone] Fax:(336) [phone]  OFFICE PROGRESS NOTE   ID: Larry Vasquez   DOB: 1951/02/26  MR#: [id number]  GGY#:[id number]   PCP: Larry Seller, MD   DIAGNOSIS:  Larry Vasquez is a 63 y.o. 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   PRIOR THERAPY: #1 Patient was seen by Dr. Kathryne Vasquez for routine health maintenance on 08/21/2012. As part of his workup patient had blood work performed including a CBC. He was incidentally found to have an elevated white count and platelet count. Total white count was 42.4 and the platelets were also elevated at 720,000. There were some smudge cells noted. There was neutrophilia. He had a repeat CBC performed on 09/12/2012 which revealed a white count of 67.5 and platelets of 705,000.  Specimen was noted to have metamyelocytes, myelocytes, promyelocytes, nucleated RBCs in neutrophilia. A repeat CBC performed on 10/03/2012 revealed a white count of 76.7, platelets at 604,000 and a continued  left shift.  #2 Bone marrow biopsy/Aspirate on 11/01/2012 showed: Bone Marrow, Aspirate,Biopsy, and Clot, right iliac - CHRONIC MYELOGENOUS LEUKEMIA. PERIPHERAL BLOOD: - CHRONIC MYELOGENOUS LEUKEMIA. Diagnosis Note Previous analysis for BCR/ABL1 performed at [facility] on 10/20/12 is positive as analyzed by quantitative.  RT-PCR technique. Given the latter finding along with the morphology in the bone marrow and peripheral blood, the features are consistent with chronic myelogenous leukemia. (BNS:gt, 11/02/12)  #3 Started taking Gleevec 400 mg PO daily on 11/07/2012. He was increased to Gleevec 421m BID on 08/02/13.   CURRENT THERAPY: Gleevec 400 mg PO daily.  INTERVAL HISTORY: Larry Vasquez a 63y.o. male who returns for follow up of CML. He has been doing well lately.  He is taking additional supplements on today's appointment.  He does tell me that he is taking the GBaileys [place], but he does miss about one dose per  week.  He tells me that this is newer that he has missed doses and has been going on in the past three months.  He denies headaches, vision changes, chest pain, shortness of breath.  He denies night sweats, chills, but does endorse fatigue that has been present over the past couple of weeks.  He tells me that he is taking Saffron which is a spice that is a capsule and he takes it every day.  He has done this for the past week.  Otherwise, a 10 point ROS is negative.   MEDICAL HISTORY: Past Medical History  Diagnosis Date  . Leukocytosis, unspecified 10/17/2012  . Thrombocytosis 10/17/2012  . Cataract     Left eye  . CML (chronic myelocytic leukemia)     ALLERGIES:   Allergies  Allergen Reactions  . Corticosteroids Other (See Comments)    Muscle stiffness     MEDICATIONS:  Current Outpatient Prescriptions  Medication Sig Dispense Refill  . ALOE VERA JUICE PO Take 30 mLs by mouth every morning.      . Coenzyme Q10 (CO Q-10) 100 MG CAPS Take 100 mg by mouth daily.      . fish oil-omega-3 fatty acids 1000 MG capsule Take 1 g by mouth daily.      .Marland Kitchenimatinib (GLEEVEC) 400 MG tablet Take 1 tablet (400 mg total) by mouth daily. Take with meals and large glass of water.Caution:Chemotherapy.  90 tablet  5  . saw palmetto 160 MG capsule Take 160 mg by mouth daily.      . Taurine (MEGA TAURINE) 1000 MG CAPS Take 1,000 mg by mouth every morning.      .Marland Kitchen  TURMERIC PO Take 2 capsules by mouth every morning.       Marland Kitchen UNABLE TO FIND Nattozines.  Pt takes 65 mg daily.      . cholecalciferol (VITAMIN D) 1000 UNITS tablet Take 2,000 Units by mouth daily.       Marland Kitchen imatinib (GLEEVEC) 400 MG tablet Take 400 mg by mouth 2 (two) times daily. Take with meals and large glass of water.Caution:Chemotherapy.      Marland Kitchen LECITHIN PO Take by mouth.      . magnesium oxide (MAG-OX) 400 MG tablet Take 400 mg by mouth every morning.       No current facility-administered medications for this visit.    SURGICAL HISTORY:   Past Surgical History  Procedure Laterality Date  . Joint replacement  2006    Total Knee Left  . Hernia repair    . Tonsillectomy    . Eye surgery  2012    Detached Retina  . Cataract extraction Left 01/01/2013   REVIEW OF SYSTEMS:  A 10 point review of systems was completed and is negative except as noted above.    PHYSICAL EXAMINATION: Blood pressure 165/85, pulse 85, temperature 97.9 F (36.6 C), temperature source Oral, resp. rate 20, height 6' 4"  (1.93 m), weight 205 lb (92.987 kg). Body mass index is 24.96 kg/(m^2). GENERAL: Patient is a well appearing male in no acute distress HEENT:  Sclerae anicteric.  Oropharynx clear and moist. No ulcerations or evidence of oropharyngeal candidiasis. Neck is supple.  NODES:  No cervical, supraclavicular, or axillary lymphadenopathy palpated.  LUNGS:  Clear to auscultation bilaterally.  No wheezes or rhonchi. HEART:  Regular rate and rhythm. No murmur appreciated. ABDOMEN:  Soft, nontender.  Positive, normoactive bowel sounds. No organomegaly palpated. MSK:  No focal spinal tenderness to palpation. Full range of motion bilaterally in the upper extremities. EXTREMITIES:  No peripheral edema.   SKIN:  Clear with no obvious rashes or skin changes. No nail dyscrasia. NEURO:  Nonfocal. Well oriented.  Appropriate affect. ECOG PERFORMANCE STATUS: 1 - Symptomatic but completely ambulatory   LABORATORY DATA: Lab Results  Component Value Date   WBC 111.3* 08/04/2013   HGB 12.0* 08/04/2013   HCT 34.6* 08/04/2013   MCV 88.3 08/04/2013   PLT 275 08/04/2013      Chemistry      Component Value Date/Time   NA 140 08/02/2013 0849   K 4.1 08/02/2013 0849   CO2 24 08/02/2013 0849   BUN 15.6 08/02/2013 0849   CREATININE 1.1 08/02/2013 0849      Component Value Date/Time   CALCIUM 8.7 08/02/2013 0849   ALKPHOS 57 08/02/2013 0849   AST 26 08/02/2013 0849   ALT 18 08/02/2013 0849   BILITOT 0.59 08/02/2013 0849       RADIOGRAPHIC STUDIES: No results  found.   ASSESSMENT/PLAN: Larry Vasquez is a 63 y.o. gentleman diagnosed with CML in 10/2012:  #1 Routine health maintenance monitoring by his PCP on 08/21/2012 showed a CBC with a total white count of 42.4 and platelets were also elevated at 720,000. There were some smudge cells noted. There was neutrophilia. He had a repeat CBC performed on 09/12/2012 which revealed a white count of 67.5 and platelets of 705,000.  Specimen was noted to have metamyelocytes, myelocytes, promyelocytes, nucleated RBCs in neutrophilia. A repeat CBC performed on 10/03/2012 revealed a white count of 76.7, platelets at 604,000 and a continued  left shift.  #2  Bone marrow biopsy from right iliac on 11/01/2012  showed chronic myelogenous leukemia in both the bone marrow aspirate and peripheral blood.  #3  He started therapy with Gleevec 400 mg PO daily on 11/07/2012.  His counts normalized.  His last BCR ABL transcript had decreased to 63%.  Today however his WBC is 103 K.  He has been mostly compliant, missing a dose per week. He is also taking a lot of supplements.  I discussed his case in detail with Dr. Alvy Bimler.  After review with her, we added lab work today including BCR-ABL, Imatinib resistance panel, and peripheral blood FISH.  The patient was instructed to stop all of his supplements, and to start taking Imatinib 459m twice a day.    He will f/u with Dr. GAlvy Bimlernext week as she will now assume his care.    The patient was given written instructions with this and verbalized understanding and agreement with this plan.     All questions answered.  Mr. SLemmermanwas encouraged to contact uKoreain the interim with any questions, concerns, or problems.  I spent 25 minutes counseling the patient face to face.  The total time spent in the appointment was 30 minutes.   LMinette Headland [id number] [phone]

## 2013-08-02 NOTE — Telephone Encounter (Signed)
per pof to sch appt-per pof to send pt to Lab-pt seeing dentisit office today wanted to ask [facility] if problem-LC adv to delay dental appt-adv pt NG will be seeing him and she has reached out to NG -gave pt copy of sch

## 2013-08-02 NOTE — Patient Instructions (Signed)
You need to start taking Gleevec, 400mg  by mouth twice a day.  You need to stop taking all supplements.  We are adding on additional labs for today.  You will follow up with Dr. Alvy Bimler next week to review all of your studies.    Please call us if you have any questions or concerns.

## 2013-08-03 ENCOUNTER — Telehealth: Payer: Self-pay | Admitting: Oncology

## 2013-08-03 NOTE — Telephone Encounter (Signed)
lvm for pt regarding to June appt... °

## 2013-08-04 ENCOUNTER — Encounter (HOSPITAL_COMMUNITY): Payer: Self-pay | Admitting: Emergency Medicine

## 2013-08-04 ENCOUNTER — Emergency Department (HOSPITAL_COMMUNITY)
Admission: EM | Admit: 2013-08-04 | Discharge: 2013-08-04 | Disposition: A | Discharge status: Home / Self Care | Payer: BC Managed Care – PPO | Attending: Emergency Medicine | Admitting: Emergency Medicine

## 2013-08-04 DIAGNOSIS — R339 Retention of urine, unspecified: Secondary | ICD-10-CM | POA: Insufficient documentation

## 2013-08-04 DIAGNOSIS — Z79899 Other long term (current) drug therapy: Secondary | ICD-10-CM | POA: Insufficient documentation

## 2013-08-04 DIAGNOSIS — C9210 Chronic myeloid leukemia, BCR/ABL-positive, not having achieved remission: Secondary | ICD-10-CM

## 2013-08-04 DIAGNOSIS — Z8669 Personal history of other diseases of the nervous system and sense organs: Secondary | ICD-10-CM | POA: Insufficient documentation

## 2013-08-04 DIAGNOSIS — Z862 Personal history of diseases of the blood and blood-forming organs and certain disorders involving the immune mechanism: Secondary | ICD-10-CM | POA: Insufficient documentation

## 2013-08-04 DIAGNOSIS — Z9849 Cataract extraction status, unspecified eye: Secondary | ICD-10-CM | POA: Insufficient documentation

## 2013-08-04 LAB — CBC WITH DIFFERENTIAL/PLATELET
BAND NEUTROPHILS: 19 % — AB (ref 0–10)
Basophils Absolute: 2.2 10*3/uL — ABNORMAL HIGH (ref 0.0–0.1)
Basophils Relative: 2 % — ABNORMAL HIGH (ref 0–1)
EOS ABS: 2.2 10*3/uL — AB (ref 0.0–0.7)
EOS PCT: 2 % (ref 0–5)
HEMATOCRIT: 34.6 % — AB (ref 39.0–52.0)
Hemoglobin: 12 g/dL — ABNORMAL LOW (ref 13.0–17.0)
LYMPHS ABS: 6.7 10*3/uL — AB (ref 0.7–4.0)
LYMPHS PCT: 6 % — AB (ref 12–46)
MCH: 30.6 pg (ref 26.0–34.0)
MCHC: 34.7 g/dL (ref 30.0–36.0)
MCV: 88.3 fL (ref 78.0–100.0)
METAMYELOCYTES PCT: 4 %
Monocytes Absolute: 2.2 10*3/uL — ABNORMAL HIGH (ref 0.1–1.0)
Monocytes Relative: 2 % — ABNORMAL LOW (ref 3–12)
Myelocytes: 9 %
NEUTROS ABS: 98 10*3/uL — AB (ref 1.7–7.7)
Neutrophils Relative %: 50 % (ref 43–77)
Platelets: 275 10*3/uL (ref 150–400)
Promyelocytes Absolute: 6 %
RBC: 3.92 MIL/uL — AB (ref 4.22–5.81)
RDW: 18.7 % — AB (ref 11.5–15.5)
WBC Morphology: INCREASED
WBC: 111.3 10*3/uL — AB (ref 4.0–10.5)

## 2013-08-04 LAB — URINALYSIS, ROUTINE W REFLEX MICROSCOPIC
Bilirubin Urine: NEGATIVE
GLUCOSE, UA: NEGATIVE mg/dL
KETONES UR: NEGATIVE mg/dL
Leukocytes, UA: NEGATIVE
Nitrite: NEGATIVE
Protein, ur: NEGATIVE mg/dL
Specific Gravity, Urine: 1.011 (ref 1.005–1.030)
UROBILINOGEN UA: 0.2 mg/dL (ref 0.0–1.0)
pH: 6 (ref 5.0–8.0)

## 2013-08-04 LAB — URINE MICROSCOPIC-ADD ON

## 2013-08-04 NOTE — ED Provider Notes (Signed)
CSN: [id number]     Arrival date & time 08/04/13  4627 History   First MD Initiated Contact with Patient 08/04/13 [phone]     Chief Complaint  Patient presents with  . Urinary Retention     The history is provided by the patient and medical records.   Patient presents emergency Department with complaints of inability to urinate since last night.  He currently is being treated for CML with Gleevec.  He recently increased his gleevac as instructed by his oncologist.  He's never had urinary retention.  He reports some discomfort in his lower abdomen.  No nausea or vomiting.  No fevers or chills.  Pain is moderate in severity.  No other complaints.  No altered mental status.   Past Medical History  Diagnosis Date  . Leukocytosis, unspecified 10/17/2012  . Thrombocytosis 10/17/2012  . Cataract     Left eye  . CML (chronic myelocytic leukemia)    Past Surgical History  Procedure Laterality Date  . Joint replacement  2006    Total Knee Left  . Hernia repair    . Tonsillectomy    . Eye surgery  2012    Detached Retina  . Cataract extraction Left 01/01/2013   Family History  Problem Relation Age of Onset  . Arthritis Mother   . Diabetes Father   . Hypertension Father   . Healthy Brother   . Healthy Brother    History  Substance Use Topics  . Smoking status: Never Smoker   . Smokeless tobacco: Never Used  . Alcohol Use: 4.2 oz/week    7 Glasses of wine per week    Review of Systems  All other systems reviewed and are negative.     Allergies  Corticosteroids  Home Medications   Prior to Admission medications   Medication Sig Start Date End Date Taking? Authorizing Provider  ALOE VERA JUICE PO Take 30 mLs by mouth every morning.   Yes Historical Provider, MD  cholecalciferol (VITAMIN D) 1000 UNITS tablet Take 2,000 Units by mouth daily.    Yes Historical Provider, MD  Coenzyme Q10 (CO Q-10) 100 MG CAPS Take 100 mg by mouth daily.   Yes Historical Provider, MD  fish  oil-omega-3 fatty acids 1000 MG capsule Take 1 g by mouth daily.   Yes Historical Provider, MD  imatinib (GLEEVEC) 400 MG tablet Take 400 mg by mouth 2 (two) times daily. Take with meals and large glass of water.Caution:Chemotherapy.   Yes Historical Provider, MD  LECITHIN PO Take by mouth.   Yes Historical Provider, MD  magnesium oxide (MAG-OX) 400 MG tablet Take 400 mg by mouth every morning.   Yes Historical Provider, MD  saw palmetto 160 MG capsule Take 160 mg by mouth daily.   Yes Historical Provider, MD  Taurine (MEGA TAURINE) 1000 MG CAPS Take 1,000 mg by mouth every morning.   Yes Historical Provider, MD  TURMERIC PO Take 2 capsules by mouth every morning.    Yes Historical Provider, MD  UNABLE TO [place].  Pt takes 65 mg daily.   Yes Historical Provider, MD  imatinib (GLEEVEC) 400 MG tablet Take 1 tablet (400 mg total) by mouth daily. Take with meals and large glass of water.Caution:Chemotherapy. 01/04/13   Vivien Rota, NP   BP 158/82  Pulse 98  Temp(Src) 98.9 F (37.2 C) (Oral)  Resp 22  SpO2 100% Physical Exam  Nursing note and vitals reviewed. Constitutional: He is oriented to person, place, and time.  He appears well-developed and well-nourished.  HENT:  Head: Normocephalic.  Eyes: EOM are normal.  Neck: Normal range of motion.  Pulmonary/Chest: Effort normal.  Abdominal: He exhibits no distension.  Musculoskeletal: Normal range of motion.  Neurological: He is alert and oriented to person, place, and time.  Psychiatric: He has a normal mood and affect.    ED Course  Procedures (including critical care time) Labs Review Labs Reviewed  CBC WITH DIFFERENTIAL - Abnormal; Notable for the following:    WBC 111.3 (*)    RBC 3.92 (*)    Hemoglobin 12.0 (*)    HCT 34.6 (*)    RDW 18.7 (*)    All other components within normal limits  URINALYSIS, ROUTINE W REFLEX MICROSCOPIC - Abnormal; Notable for the following:    Hgb urine dipstick LARGE (*)    All  other components within normal limits  URINE MICROSCOPIC-ADD ON - Abnormal; Notable for the following:    Squamous Epithelial / LPF FEW (*)    Bacteria, UA FEW (*)    All other components within normal limits  URINE CULTURE    Imaging Review No results found.   EKG Interpretation None      MDM   Final diagnoses:  Urinary retention  CML (chronic myelocytic leukemia)    Patient feels much better after urinary retention resolved.  Patient home with catheter in place.  Home with leg bag.  Urology followup.  He understands importance of close followup.  His white blood cell count of 111 is noted.  It was 103 in the oncology office several days ago.  He is following closely with his oncologist regarding this.  No need or indication for additional management treatment today based on his CBC.  He is overall well-appearing.    Hoy Morn, MD 08/04/13 [phone]

## 2013-08-04 NOTE — ED Notes (Signed)
MD at bedside. 

## 2013-08-04 NOTE — Discharge Instructions (Signed)
Acute Urinary Retention, Male °Acute urinary retention is the temporary inability to urinate. °This is a common problem in older men. As men age their prostates become larger and block the flow of urine from the bladder. This is usually a problem that has come on gradually.  °HOME CARE INSTRUCTIONS °If you are sent home with a Foley catheter and a drainage system, you will need to discuss the best course of action with your health care provider. While the catheter is in, maintain a good intake of fluids. Keep the drainage bag emptied and lower than your catheter. This is so that contaminated urine will not flow back into your bladder, which could lead to a urinary tract infection. °There are two main types of drainage bags. One is a large bag that usually is used at night. It has a good capacity that will allow you to sleep through the night without having to empty it. The second type is called a leg bag. It has a smaller capacity, so it needs to be emptied more frequently. However, the main advantage is that it can be attached by a leg strap and can go underneath your clothing, allowing you the freedom to move about or leave your home. °Only take over-the-counter or prescription medicines for pain, discomfort, or fever as directed by your health care provider.  °SEEK MEDICAL CARE IF: °· You develop a low-grade fever. °· You experience spasms or leakage of urine with the spasms. °SEEK IMMEDIATE MEDICAL CARE IF:  °· You develop chills or fever. °· Your catheter stops draining urine. °· Your catheter falls out. °· You start to develop increased bleeding that does not respond to rest and increased fluid intake. °MAKE SURE YOU: °· Understand these instructions. °· Will watch your condition. °· Will get help right away if you are not doing well or get worse. °Document Released: 05/31/2000 Document Revised: 10/25/2012 Document Reviewed: 08/03/2012 °[employer]® Patient Information ©2014 [employer]. ° °

## 2013-08-04 NOTE — ED Notes (Signed)
Pt c/o of difficulty urinating. Pt hx of CML. Last time urinating 7pm last night. Pain, distended.

## 2013-08-05 LAB — URINE CULTURE
Colony Count: NO GROWTH
Culture: NO GROWTH

## 2013-08-06 LAB — PATHOLOGIST SMEAR REVIEW

## 2013-08-08 LAB — FISH, PERIPHERAL BLOOD

## 2013-08-08 LAB — IMATINIB RESISTANCE IN CML (GENPATH)

## 2013-08-09 ENCOUNTER — Other Ambulatory Visit (HOSPITAL_BASED_OUTPATIENT_CLINIC_OR_DEPARTMENT_OTHER): Payer: BC Managed Care – PPO

## 2013-08-09 ENCOUNTER — Telehealth: Payer: Self-pay | Admitting: Hematology and Oncology

## 2013-08-09 ENCOUNTER — Ambulatory Visit: Payer: BC Managed Care – PPO

## 2013-08-09 ENCOUNTER — Other Ambulatory Visit: Payer: Self-pay | Admitting: Hematology and Oncology

## 2013-08-09 ENCOUNTER — Encounter: Payer: Self-pay | Admitting: Hematology and Oncology

## 2013-08-09 ENCOUNTER — Encounter: Payer: Self-pay | Admitting: *Deleted

## 2013-08-09 ENCOUNTER — Ambulatory Visit (HOSPITAL_BASED_OUTPATIENT_CLINIC_OR_DEPARTMENT_OTHER): Payer: BC Managed Care – PPO | Admitting: Hematology and Oncology

## 2013-08-09 VITALS — BP 172/71 | HR 90 | Temp 97.9°F | Resp 18 | Ht 76.0 in | Wt 203.4 lb

## 2013-08-09 DIAGNOSIS — R339 Retention of urine, unspecified: Secondary | ICD-10-CM

## 2013-08-09 DIAGNOSIS — E790 Hyperuricemia without signs of inflammatory arthritis and tophaceous disease: Secondary | ICD-10-CM | POA: Insufficient documentation

## 2013-08-09 DIAGNOSIS — R7989 Other specified abnormal findings of blood chemistry: Secondary | ICD-10-CM

## 2013-08-09 DIAGNOSIS — C9210 Chronic myeloid leukemia, BCR/ABL-positive, not having achieved remission: Secondary | ICD-10-CM

## 2013-08-09 DIAGNOSIS — T451X5A Adverse effect of antineoplastic and immunosuppressive drugs, initial encounter: Secondary | ICD-10-CM

## 2013-08-09 DIAGNOSIS — D630 Anemia in neoplastic disease: Secondary | ICD-10-CM

## 2013-08-09 DIAGNOSIS — D61810 Antineoplastic chemotherapy induced pancytopenia: Secondary | ICD-10-CM | POA: Insufficient documentation

## 2013-08-09 LAB — MANUAL DIFFERENTIAL
ALC: 5.5 10*3/uL — AB (ref 0.9–3.3)
ANC (CHCC manual diff): 91.1 10*3/uL — ABNORMAL HIGH (ref 1.5–6.5)
BAND NEUTROPHILS: 19 % — AB (ref 0–10)
BLASTS: 0 % (ref 0–0)
Basophil: 4 % — ABNORMAL HIGH (ref 0–2)
EOS%: 3 % (ref 0–7)
LYMPH: 5 % — ABNORMAL LOW (ref 14–49)
MONO: 5 % (ref 0–14)
Metamyelocytes: 12 % — ABNORMAL HIGH (ref 0–0)
Myelocytes: 7 % — ABNORMAL HIGH (ref 0–0)
NRBC: 0 % (ref 0–0)
Other Cell: 0 % (ref 0–0)
PLT EST: ADEQUATE
PROMYELO: 0 % (ref 0–0)
SEG: 45 % (ref 38–77)
VARIANT LYMPH: 0 % (ref 0–0)

## 2013-08-09 LAB — CBC WITH DIFFERENTIAL/PLATELET
HCT: 38.4 % (ref 38.4–49.9)
HEMOGLOBIN: 12.5 g/dL — AB (ref 13.0–17.1)
MCH: 29.4 pg (ref 27.2–33.4)
MCHC: 32.6 g/dL (ref 32.0–36.0)
MCV: 90.3 fL (ref 79.3–98.0)
PLATELETS: 345 10*3/uL (ref 140–400)
RBC: 4.25 10*6/uL (ref 4.20–5.82)
RDW: 18.3 % — ABNORMAL HIGH (ref 11.0–14.6)
WBC: 109.8 10*3/uL (ref 4.0–10.3)

## 2013-08-09 LAB — COMPREHENSIVE METABOLIC PANEL (CC13)
ALT: 16 U/L (ref 0–55)
AST: 21 U/L (ref 5–34)
Albumin: 3.6 g/dL (ref 3.5–5.0)
Alkaline Phosphatase: 60 U/L (ref 40–150)
Anion Gap: 13 mEq/L — ABNORMAL HIGH (ref 3–11)
BUN: 13.3 mg/dL (ref 7.0–26.0)
CO2: 21 mEq/L — ABNORMAL LOW (ref 22–29)
CREATININE: 1.2 mg/dL (ref 0.7–1.3)
Calcium: 8.9 mg/dL (ref 8.4–10.4)
Chloride: 106 mEq/L (ref 98–109)
Glucose: 79 mg/dl (ref 70–140)
Potassium: 4 mEq/L (ref 3.5–5.1)
Sodium: 141 mEq/L (ref 136–145)
Total Bilirubin: 0.79 mg/dL (ref 0.20–1.20)
Total Protein: 6.6 g/dL (ref 6.4–8.3)

## 2013-08-09 LAB — URIC ACID (CC13): URIC ACID, SERUM: 9.5 mg/dL — AB (ref 2.6–7.4)

## 2013-08-09 LAB — LACTATE DEHYDROGENASE (CC13): LDH: 672 U/L — ABNORMAL HIGH (ref 125–245)

## 2013-08-09 MED ORDER — DASATINIB 100 MG PO TABS: 100.0000 mg | ORAL_TABLET | Freq: Every day | ORAL | Status: DC

## 2013-08-09 MED ORDER — HYDROXYUREA 500 MG PO CAPS: 1000.0000 mg | ORAL_CAPSULE | Freq: Every day | ORAL | Status: DC

## 2013-08-09 MED ORDER — ALLOPURINOL 300 MG PO TABS: 300.0000 mg | ORAL_TABLET | Freq: Every day | ORAL | Status: DC

## 2013-08-09 NOTE — Progress Notes (Signed)
Faxed sprycel prescription to [facility]

## 2013-08-09 NOTE — Assessment & Plan Note (Signed)
I will defer to urologists for further management.

## 2013-08-09 NOTE — Progress Notes (Signed)
Larry Vasquez progress notes  Patient Care Team: [name], MD as PCP - General (Family Medicine)  CHIEF COMPLAINTS/PURPOSE OF VISIT:  CML with progressive leukocytosis  HISTORY OF PRESENTING ILLNESS:  Larry Vasquez 63 y.o. male was transferred to my care after his prior physician has left.  I reviewed the patient's records extensive and collaborated the history with the patient. Summary of his history is as follows: Patient was seen by Dr. Kathryne Eriksson for routine health maintenance on 08/21/2012. As part of his workup patient had blood work performed including a CBC. He was incidentally found to have an elevated white count and platelet count. Specimen was noted to have metamyelocytes, myelocytes, promyelocytes, nucleated RBCs in neutrophilia. 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 He was started on Gleevec 400 mg PO daily on 11/07/2012. He was increased to Gleevec 426m BID on 08/02/13.  Complaint of fatigue. He had mild skin rash and loose bowel movements since recent dose increment. He denies any recent fever, chills, night sweats or abnormal weight loss. Last weekend, he developed urinary retention and he ended up going to the ER for this.  MEDICAL HISTORY:  Past Medical History  Diagnosis Date  . Leukocytosis, unspecified 10/17/2012  . Thrombocytosis 10/17/2012  . Cataract     Left eye  . CML (chronic myelocytic leukemia)     SURGICAL HISTORY: Past Surgical History  Procedure Laterality Date  . Joint replacement  2006    Total Knee Left  . Hernia repair    . Tonsillectomy    . Eye surgery  2012    Detached Retina  . Cataract extraction Left 01/01/2013    SOCIAL HISTORY: History   Social History  . Marital Status: Married    Spouse Name: [name]    Number of Children: [name]  . Years of Education: [name]   Occupational History  . Not on file.   Social History Main Topics  . Smoking status: Never  Smoker   . Smokeless tobacco: Never Used  . Alcohol Use: 4.2 oz/week    7 Glasses of wine per week  . Drug Use: No  . Sexual Activity: Yes    Birth Control/ Protection: None   Other Topics Concern  . Not on file   Social History Narrative  . No narrative on file    FAMILY HISTORY: Family History  Problem Relation Age of Onset  . Arthritis Mother   . Diabetes Father   . Hypertension Father   . Cancer Father     bladder ca  . Healthy Brother   . Healthy Brother     ALLERGIES:  is allergic to corticosteroids.  MEDICATIONS:  Current Outpatient Prescriptions  Medication Sig Dispense Refill  . ALOE VERA JUICE PO Take 30 mLs by mouth every morning.      . cholecalciferol (VITAMIN D) 1000 UNITS tablet Take 2,000 Units by mouth daily.       . Coenzyme Q10 (CO Q-10) 100 MG CAPS Take 100 mg by mouth daily.      . fish oil-omega-3 fatty acids 1000 MG capsule Take 1 g by mouth daily.      .Marland Kitchenimatinib (GLEEVEC) 400 MG tablet Take 400 mg by mouth 2 (two) times daily. Take with meals and large glass of water.Caution:Chemotherapy.      .Marland KitchenLECITHIN PO Take by mouth.      . magnesium oxide (MAG-OX) 400 MG tablet Take 400 mg by mouth every  morning.      . saw palmetto 160 MG capsule Take 160 mg by mouth daily.      . Taurine (MEGA TAURINE) 1000 MG CAPS Take 1,000 mg by mouth every morning.      . TURMERIC PO Take 2 capsules by mouth every morning.       Marland Kitchen UNABLE TO FIND Nattozines.  Pt takes 65 mg daily.      Marland Kitchen allopurinol (ZYLOPRIM) 300 MG tablet Take 1 tablet (300 mg total) by mouth daily.  30 tablet  0  . dasatinib (SPRYCEL) 100 MG tablet Take 1 tablet (100 mg total) by mouth daily.  30 tablet  6  . hydroxyurea (HYDREA) 500 MG capsule Take 2 capsules (1,000 mg total) by mouth daily. May take with food to minimize GI side effects.  30 capsule  0   No current facility-administered medications for this visit.    REVIEW OF SYSTEMS:   Constitutional: Denies fevers, chills or abnormal  night sweats Eyes: Denies blurriness of vision, double vision or watery eyes Ears, nose, mouth, throat, and face: Denies mucositis or sore throat Respiratory: Denies cough, dyspnea or wheezes Cardiovascular: Denies palpitation, chest discomfort or lower extremity swelling Lymphatics: Denies new lymphadenopathy or easy bruising Neurological:Denies numbness, tingling or new weaknesses Behavioral/Psych: Mood is stable, no new changes  All other systems were reviewed with the patient and are negative.  PHYSICAL EXAMINATION: ECOG PERFORMANCE STATUS: 1 - Symptomatic but completely ambulatory  Filed Vitals:   08/09/13 0938  BP: 172/71  Pulse: 90  Temp: 97.9 F (36.6 C)  Resp: 18   Filed Weights   08/09/13 0938  Weight: 203 lb 6.4 oz (92.262 kg)    GENERAL:alert, no distress and comfortable SKIN: skin color, texture, turgor are normal, no rashes or significant lesions EYES: normal, conjunctiva are pink and non-injected, sclera clear OROPHARYNX:no exudate, normal lips, buccal mucosa, and tongue  NECK: supple, thyroid normal size, non-tender, without nodularity LYMPH:  no palpable lymphadenopathy in the cervical, axillary or inguinal LUNGS: clear to auscultation and percussion with normal breathing effort HEART: regular rate & rhythm and no murmurs without lower extremity edema ABDOMEN:abdomen soft, non-tender and normal bowel sounds Musculoskeletal:no cyanosis of digits and no clubbing. No splenomegaly PSYCH: alert & oriented x 3 with fluent speech NEURO: no focal motor/sensory deficits  LABORATORY DATA:  I have reviewed the data as listed Lab Results  Component Value Date   WBC 109.8* 08/09/2013   HGB 12.5* 08/09/2013   HCT 38.4 08/09/2013   MCV 90.3 08/09/2013   PLT 345 08/09/2013    Recent Labs  10/17/12 1246 08/02/13 0849 08/09/13 0923  NA 140 140 141  K 4.2 4.1 4.0  CO2 24 24 21*  GLUCOSE 95 121 79  BUN 14.3 15.6 13.3  CREATININE 1.1 1.1 1.2  CALCIUM 9.4 8.7 8.9  PROT  7.3 6.4 6.6  ALBUMIN 3.9 3.7 3.6  AST _0 ALT _1 ALKPHOS 59 57 60  BILITOT 0.78 0.59 0.79    ASSESSMENT & PLAN:  Chronic myelogenous leukemia I had a long discussion with the patient the reason of switching his treatment. Pending approval for dasatinib, I will start him on hydroxyurea 1000 mg daily. The patient will come in on a weekly basis for blood work. I discussed with him the risks, benefits and side effects of dasatinib including risk of fluid retention, shortness of breath, pancytopenia and risk of infection, and he agreed to proceed. In the meantime,  the patient is advised to discontinue all his herbal supplements. He is allowed to take vitamin D supplements for now.  Urinary retention I will defer to urologists for further management.  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 He is not symptomatic. Continue observation.  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 The patient is at risk of tumor lysis syndrome. I was start him on allopurinol 300 mg daily.    Orders Placed This Encounter  Procedures  . CBC with Differential    Standing Status: Standing     Number of Occurrences: 33     Standing Expiration Date: 08/10/2014  . Comprehensive metabolic panel    Standing Status: Future     Number of Occurrences:      Standing Expiration Date: 08/09/2014  . Lactate dehydrogenase    Standing Status: Future     Number of Occurrences:      Standing Expiration Date: 08/09/2014    All questions were answered. The patient knows to call the clinic with any problems, questions or concerns.    Heath Lark, MD 08/09/2013 8:37 PM

## 2013-08-09 NOTE — Assessment & Plan Note (Addendum)
I had a long discussion with the patient the reason of switching his treatment. Pending approval for dasatinib, I will start him on hydroxyurea 1000 mg daily. The patient will come in on a weekly basis for blood work. I discussed with him the risks, benefits and side effects of dasatinib including risk of fluid retention, shortness of breath, pancytopenia and risk of infection, and he agreed to proceed. In the meantime, the patient is advised to discontinue all his herbal supplements. He is allowed to take vitamin D supplements for now.

## 2013-08-09 NOTE — Progress Notes (Signed)
Rx for Sprycel placed on [facility] desk in managed care for pre auth.

## 2013-08-09 NOTE — Assessment & Plan Note (Signed)
This is related to his underlying disease. He is not symptomatic. Continue observation.

## 2013-08-09 NOTE — Patient Instructions (Signed)
Dasatinib oral tablet  What is this medicine?  DASATINIB (da SA ti nib) is a chemotherapy drug. It targets specific proteins within cancer cells and stops the cancer cells from growing. It is used to treat some kinds of leukemia.  This medicine may be used for other purposes; ask your health care provider or pharmacist if you have questions.  COMMON BRAND NAME(S): Sprycel  What should I tell my health care provider before I take this medicine?  They need to know if you have any of these conditions:  -bleeding problems  -heart disease  -heart failure  -infection (especially a virus infection such as chickenpox, cold sores, or herpes)  -irregular heartbeat  -liver disease  -low blood counts, like low white cell, platelet, or red cell counts  -take medicines that treat or prevent blood clots  -an unusual or allergic reaction to dasatinib, lactose, other medicines, foods, dyes, or preservatives  -pregnant or trying to get pregnant  -breast-feeding  How should I use this medicine?  Take this medicine by mouth with a glass of water. Follow the directions on the prescription label. You can take it with or without food. Do not cut, crush or chew this medicine. Take your medicine at regular intervals. Do not take your medicine more often than directed. Do not stop taking except on your doctor's advice.  Avoid taking antacids containing aluminum, calcium, or magnesium within 2 hours of taking this medicine. You can take such antacids up to 2 hours before or 2 hours after this medicine. Avoid taking all other medicines that reduce stomach acid.  Talk to your pediatrician regarding the use of this medicine in children. Special care may be needed.  Overdosage: If you think you have taken too much of this medicine contact a poison control center or emergency room at once.  NOTE: This medicine is only for you. Do not share this medicine with others.  What if I miss a dose?  If you miss a dose, take your next scheduled dose at its  regular time. Do not take double or extra doses. Talk to your doctor if you are not sure what to do.  What may interact with this medicine?  Do not take this medicine with any of the following medications:  -certain medicines for fungal infections like fluconazole, itraconazole, ketoconazole, posaconazole, voriconazole  -certain medicines for stomach problems like cimetidine, famotidine, ranitidine, omeprazole  -cisapride  -dofetilide  -dronedarone  -ergot alkaloids like dihydroergotamine, ergonovine, ergotamine, methylergonovine  -pimozide  -St. John's Wort  -thioridazine  -ziprasidone   This medicine may also interact with the following medications:  -acetaminophen  -alfentanil  -antiviral medicines for HIV or AIDS  -aspirin  -carbamazepine  -certain antibiotics like clarithromycin, erythromycin, rifampin, rifabutin, rifapentine, telithromycin, troleandomycin  -certain medicines for cholesterol like atorvastatin  -certain medicines that treat or prevent blood clots like warfarin  -cyclosporine  -dexamethasone  -fentanyl  -NSAIDS, medicines for pain and inflammation, like ibuprofen, ketoprofen, naproxen  -other medicines that prolong the QT interval (cause an abnormal heart rhythm)  -phenobarbital  -phenytoin  -sirolimus  -tacrolimus  This list may not describe all possible interactions. Give your health care provider a list of all the medicines, herbs, non-prescription drugs, or dietary supplements you use. Also tell them if you smoke, drink alcohol, or use illegal drugs. Some items may interact with your medicine.  What should I watch for while using this medicine?  Visit your doctor for regular check ups. Report any side effects. Continue your   course of treatment unless your doctor tells you to stop. You will need blood work done while you are taking this medicine.  Call your doctor or health care professional for advice if you get a fever, chills or sore throat, or other symptoms of a cold or flu. Do not treat  yourself. This drug decreases your body's ability to fight infections. Try to avoid being around people who are sick.  This medicine may increase your risk to bruise or bleed. Call your doctor or health care professional if you notice any unusual bleeding.  Be careful brushing and flossing your teeth or using a toothpick because you may get an infection or bleed more easily. If you have any dental work done, tell your dentist you are receiving this medicine.  Avoid taking products that contain aspirin, acetaminophen, ibuprofen, naproxen, or ketoprofen unless instructed by your doctor. These medicines may hide a fever.  Do not become pregnant while taking this medicine. Women should inform their doctor if they wish to become pregnant or think they might be pregnant. There is a potential for serious side effects to an unborn child. Talk to your health care professional or pharmacist for more information. Do not breast-feed an infant while taking this medicine. Males who take this medicine should use a condom to avoid pregnancy in their partner.  What side effects may I notice from receiving this medicine?  Side effects that you should report to your doctor or health care professional as soon as possible:  -allergic reactions like skin rash, itching or hives, swelling of the face, lips, or tongue  -low blood counts - this medicine may decrease the number of white blood cells, red blood cells, and platelets.  You may be at increased risk for infections and bleeding.  -signs of infection - fever or chills, cough, sore throat, pain or difficulty passing urine  -signs of decreased platelets or bleeding - bruising, pinpoint red spots on the skin, black, tarry stools, blood in the urine, nosebleeds  -signs of decreased red blood cells - unusual weakness or tiredness, fainting spells, lightheadedness  -breathing problems  -dry cough  -fast, irregular heartbeat  -swelling of the legs or ankles, or other parts of the  body  -sores or white patches in your mouth or throat  -sudden weight gain  Side effects that usually do not require medical attention (report to your prescriber or health care professional if they continue or are bothersome):  -diarrhea  -headache  -nausea, vomiting  -weak or tired  This list may not describe all possible side effects. Call your doctor for medical advice about side effects. You may report side effects to FDA at 1-800-FDA-1088.  Where should I keep my medicine?  Keep out of the reach of children.  Store at room temperature between 15 and 30 degrees C (59 and 86 degrees F). Keep tightly closed. Throw away any unused medicine after the expiration date.  NOTE: This sheet is a summary. It may not cover all possible information. If you have questions about this medicine, talk to your doctor, pharmacist, or health care provider.   2014, [employer]vier/Gold Standard. (2012-09-15 14:18:42)

## 2013-08-09 NOTE — Assessment & Plan Note (Signed)
This is related to hypertension of disease. The patient is at risk of tumor lysis syndrome. I was start him on allopurinol 300 mg daily.

## 2013-08-09 NOTE — Telephone Encounter (Signed)
gv adn printed appt sched and avs for pt for June and July.... °

## 2013-08-10 NOTE — Progress Notes (Signed)
RECEIVED A FAX FROM [facility]ERNING A NOTICE OF RX TRANSFER FOR SPRYCEL TO [facility]. PHONE #[phone]/FAX [phone].

## 2013-08-16 ENCOUNTER — Other Ambulatory Visit: Payer: Self-pay | Admitting: Hematology and Oncology

## 2013-08-16 ENCOUNTER — Other Ambulatory Visit (HOSPITAL_BASED_OUTPATIENT_CLINIC_OR_DEPARTMENT_OTHER): Payer: BC Managed Care – PPO

## 2013-08-16 DIAGNOSIS — C9210 Chronic myeloid leukemia, BCR/ABL-positive, not having achieved remission: Secondary | ICD-10-CM

## 2013-08-16 LAB — CBC WITH DIFFERENTIAL/PLATELET
BASO%: 0.3 % (ref 0.0–2.0)
BASOS ABS: 0.2 10*3/uL — AB (ref 0.0–0.1)
EOS%: 2.2 % (ref 0.0–7.0)
Eosinophils Absolute: 1.4 10*3/uL — ABNORMAL HIGH (ref 0.0–0.5)
HEMATOCRIT: 38.4 % (ref 38.4–49.9)
HGB: 12.6 g/dL — ABNORMAL LOW (ref 13.0–17.1)
LYMPH%: 4.6 % — AB (ref 14.0–49.0)
MCH: 29.4 pg (ref 27.2–33.4)
MCHC: 32.7 g/dL (ref 32.0–36.0)
MCV: 89.9 fL (ref 79.3–98.0)
MONO#: 1.6 10*3/uL — ABNORMAL HIGH (ref 0.1–0.9)
MONO%: 2.4 % (ref 0.0–14.0)
NEUT#: 59.7 10*3/uL — ABNORMAL HIGH (ref 1.5–6.5)
NEUT%: 90.5 % — AB (ref 39.0–75.0)
PLATELETS: 379 10*3/uL (ref 140–400)
RBC: 4.27 10*6/uL (ref 4.20–5.82)
RDW: 18.3 % — ABNORMAL HIGH (ref 11.0–14.6)
WBC: 65.9 10*3/uL (ref 4.0–10.3)
lymph#: 3 10*3/uL (ref 0.9–3.3)

## 2013-08-16 LAB — COMPREHENSIVE METABOLIC PANEL (CC13)
ALT: 16 U/L (ref 0–55)
AST: 23 U/L (ref 5–34)
Albumin: 3.5 g/dL (ref 3.5–5.0)
Alkaline Phosphatase: 57 U/L (ref 40–150)
Anion Gap: 10 mEq/L (ref 3–11)
BILIRUBIN TOTAL: 0.54 mg/dL (ref 0.20–1.20)
BUN: 14.1 mg/dL (ref 7.0–26.0)
CALCIUM: 8.9 mg/dL (ref 8.4–10.4)
CO2: 24 mEq/L (ref 22–29)
CREATININE: 1 mg/dL (ref 0.7–1.3)
Chloride: 107 mEq/L (ref 98–109)
Glucose: 107 mg/dl (ref 70–140)
Potassium: 3.9 mEq/L (ref 3.5–5.1)
Sodium: 142 mEq/L (ref 136–145)
Total Protein: 6.5 g/dL (ref 6.4–8.3)

## 2013-08-16 LAB — LACTATE DEHYDROGENASE (CC13): LDH: 766 U/L — ABNORMAL HIGH (ref 125–245)

## 2013-08-16 LAB — URIC ACID (CC13): URIC ACID, SERUM: 6.1 mg/dL (ref 2.6–7.4)

## 2013-08-16 LAB — TECHNOLOGIST REVIEW

## 2013-08-20 ENCOUNTER — Telehealth: Payer: Self-pay | Admitting: *Deleted

## 2013-08-20 NOTE — Telephone Encounter (Signed)
Yes, until prescription runs out

## 2013-08-20 NOTE — Telephone Encounter (Signed)
Pt reports started on Sprycel yesterday.  He wants to know if he is to continue taking Allopurinol?

## 2013-08-20 NOTE — Telephone Encounter (Signed)
Left VM for pt informing he can stop Allopurinol per Dr. Alvy Bimler once his current rx runs out.  Please call back if any questions.

## 2013-08-22 ENCOUNTER — Encounter: Payer: Self-pay | Admitting: Medical Oncology

## 2013-08-22 ENCOUNTER — Telehealth: Payer: Self-pay | Admitting: Medical Oncology

## 2013-08-22 ENCOUNTER — Encounter: Payer: Self-pay | Admitting: Hematology and Oncology

## 2013-08-22 NOTE — Telephone Encounter (Signed)
Responded to pt's email. See email encounter.

## 2013-08-22 NOTE — Telephone Encounter (Signed)
This note was copied & placed on Dr. Calton Dach desk for review.

## 2013-08-22 NOTE — Telephone Encounter (Signed)
Call from patient, he was asking if he should stay on the allopurinol because he thought he was supposed to stop it, states missed only one day dose. Informed patient per MD note that he is to continue taking his allopurinol till his prescription runs out. Patient gave verbal understanding, states will continue with allopurinol and confirmed future appts with him.  No further questions at this time, knows to call office for concerns or any further questions.

## 2013-08-23 ENCOUNTER — Other Ambulatory Visit (HOSPITAL_BASED_OUTPATIENT_CLINIC_OR_DEPARTMENT_OTHER): Payer: BC Managed Care – PPO

## 2013-08-23 DIAGNOSIS — C9210 Chronic myeloid leukemia, BCR/ABL-positive, not having achieved remission: Secondary | ICD-10-CM

## 2013-08-23 LAB — CBC WITH DIFFERENTIAL/PLATELET
BASO%: 0.3 % (ref 0.0–2.0)
Basophils Absolute: 0.1 10*3/uL (ref 0.0–0.1)
EOS%: 2.3 % (ref 0.0–7.0)
Eosinophils Absolute: 1.1 10*3/uL — ABNORMAL HIGH (ref 0.0–0.5)
HEMATOCRIT: 35.9 % — AB (ref 38.4–49.9)
HGB: 11.8 g/dL — ABNORMAL LOW (ref 13.0–17.1)
LYMPH#: 3.1 10*3/uL (ref 0.9–3.3)
LYMPH%: 6.5 % — ABNORMAL LOW (ref 14.0–49.0)
MCH: 30.1 pg (ref 27.2–33.4)
MCHC: 32.9 g/dL (ref 32.0–36.0)
MCV: 91.5 fL (ref 79.3–98.0)
MONO#: 1.4 10*3/uL — AB (ref 0.1–0.9)
MONO%: 2.9 % (ref 0.0–14.0)
NEUT#: 42.2 10*3/uL — ABNORMAL HIGH (ref 1.5–6.5)
NEUT%: 88 % — AB (ref 39.0–75.0)
Platelets: 363 10*3/uL (ref 140–400)
RBC: 3.92 10*6/uL — ABNORMAL LOW (ref 4.20–5.82)
RDW: 18.3 % — AB (ref 11.0–14.6)
WBC: 48 10*3/uL — AB (ref 4.0–10.3)

## 2013-08-23 LAB — TECHNOLOGIST REVIEW

## 2013-08-30 ENCOUNTER — Other Ambulatory Visit (HOSPITAL_BASED_OUTPATIENT_CLINIC_OR_DEPARTMENT_OTHER): Payer: BC Managed Care – PPO

## 2013-08-30 ENCOUNTER — Telehealth: Payer: Self-pay | Admitting: *Deleted

## 2013-08-30 DIAGNOSIS — C9210 Chronic myeloid leukemia, BCR/ABL-positive, not having achieved remission: Secondary | ICD-10-CM

## 2013-08-30 LAB — CBC WITH DIFFERENTIAL/PLATELET
BASO%: 8.2 % — ABNORMAL HIGH (ref 0.0–2.0)
Basophils Absolute: 0.5 10*3/uL — ABNORMAL HIGH (ref 0.0–0.1)
EOS ABS: 0.2 10*3/uL (ref 0.0–0.5)
EOS%: 3 % (ref 0.0–7.0)
HCT: 36 % — ABNORMAL LOW (ref 38.4–49.9)
HEMOGLOBIN: 12 g/dL — AB (ref 13.0–17.1)
LYMPH%: 18.6 % (ref 14.0–49.0)
MCH: 30.3 pg (ref 27.2–33.4)
MCHC: 33.3 g/dL (ref 32.0–36.0)
MCV: 90.9 fL (ref 79.3–98.0)
MONO#: 0.9 10*3/uL (ref 0.1–0.9)
MONO%: 13.6 % (ref 0.0–14.0)
NEUT%: 56.6 % (ref 39.0–75.0)
NEUTROS ABS: 3.7 10*3/uL (ref 1.5–6.5)
NRBC: 0 % (ref 0–0)
Platelets: 302 10*3/uL (ref 140–400)
RBC: 3.96 10*6/uL — ABNORMAL LOW (ref 4.20–5.82)
RDW: 18.8 % — AB (ref 11.0–14.6)
WBC: 6.6 10*3/uL (ref 4.0–10.3)
lymph#: 1.2 10*3/uL (ref 0.9–3.3)

## 2013-08-30 NOTE — Telephone Encounter (Signed)
Spoke with pt and informed pt re:  CBC is normal as per Dr. Alvy Bimler.  Pt voiced understanding and aware of next returned appts.

## 2013-09-06 ENCOUNTER — Other Ambulatory Visit (HOSPITAL_BASED_OUTPATIENT_CLINIC_OR_DEPARTMENT_OTHER): Payer: BC Managed Care – PPO

## 2013-09-06 DIAGNOSIS — C9210 Chronic myeloid leukemia, BCR/ABL-positive, not having achieved remission: Secondary | ICD-10-CM

## 2013-09-06 LAB — CBC WITH DIFFERENTIAL/PLATELET
BASO%: 4.4 % — AB (ref 0.0–2.0)
Basophils Absolute: 0.2 10*3/uL — ABNORMAL HIGH (ref 0.0–0.1)
EOS%: 4.9 % (ref 0.0–7.0)
Eosinophils Absolute: 0.2 10*3/uL (ref 0.0–0.5)
HEMATOCRIT: 35.7 % — AB (ref 38.4–49.9)
HEMOGLOBIN: 11.8 g/dL — AB (ref 13.0–17.1)
LYMPH#: 1 10*3/uL (ref 0.9–3.3)
LYMPH%: 21.3 % (ref 14.0–49.0)
MCH: 30.8 pg (ref 27.2–33.4)
MCHC: 33 g/dL (ref 32.0–36.0)
MCV: 93.3 fL (ref 79.3–98.0)
MONO#: 0.9 10*3/uL (ref 0.1–0.9)
MONO%: 18.7 % — ABNORMAL HIGH (ref 0.0–14.0)
NEUT%: 50.7 % (ref 39.0–75.0)
NEUTROS ABS: 2.4 10*3/uL (ref 1.5–6.5)
Platelets: 212 10*3/uL (ref 140–400)
RBC: 3.83 10*6/uL — ABNORMAL LOW (ref 4.20–5.82)
RDW: 17.9 % — ABNORMAL HIGH (ref 11.0–14.6)
WBC: 4.8 10*3/uL (ref 4.0–10.3)

## 2013-09-13 ENCOUNTER — Telehealth: Payer: Self-pay | Admitting: *Deleted

## 2013-09-13 ENCOUNTER — Encounter: Payer: Self-pay | Admitting: Hematology and Oncology

## 2013-09-13 ENCOUNTER — Ambulatory Visit (HOSPITAL_BASED_OUTPATIENT_CLINIC_OR_DEPARTMENT_OTHER): Payer: BC Managed Care – PPO | Admitting: Hematology and Oncology

## 2013-09-13 ENCOUNTER — Telehealth: Payer: Self-pay | Admitting: Hematology and Oncology

## 2013-09-13 ENCOUNTER — Other Ambulatory Visit (HOSPITAL_BASED_OUTPATIENT_CLINIC_OR_DEPARTMENT_OTHER): Payer: BC Managed Care – PPO

## 2013-09-13 VITALS — BP 159/79 | HR 75 | Temp 97.6°F | Resp 18 | Ht 76.0 in | Wt 201.8 lb

## 2013-09-13 DIAGNOSIS — D630 Anemia in neoplastic disease: Secondary | ICD-10-CM

## 2013-09-13 DIAGNOSIS — C9210 Chronic myeloid leukemia, BCR/ABL-positive, not having achieved remission: Secondary | ICD-10-CM

## 2013-09-13 LAB — COMPREHENSIVE METABOLIC PANEL (CC13)
ALT: 14 U/L (ref 0–55)
AST: 16 U/L (ref 5–34)
Albumin: 3.5 g/dL (ref 3.5–5.0)
Alkaline Phosphatase: 72 U/L (ref 40–150)
Anion Gap: 8 mEq/L (ref 3–11)
BUN: 15 mg/dL (ref 7.0–26.0)
CALCIUM: 8.8 mg/dL (ref 8.4–10.4)
CHLORIDE: 108 meq/L (ref 98–109)
CO2: 26 meq/L (ref 22–29)
CREATININE: 0.8 mg/dL (ref 0.7–1.3)
Glucose: 95 mg/dl (ref 70–140)
Potassium: 4.3 mEq/L (ref 3.5–5.1)
Sodium: 142 mEq/L (ref 136–145)
Total Bilirubin: 0.48 mg/dL (ref 0.20–1.20)
Total Protein: 6.5 g/dL (ref 6.4–8.3)

## 2013-09-13 LAB — CBC WITH DIFFERENTIAL/PLATELET
BASO%: 3.1 % — ABNORMAL HIGH (ref 0.0–2.0)
BASOS ABS: 0.1 10*3/uL (ref 0.0–0.1)
EOS%: 2.7 % (ref 0.0–7.0)
Eosinophils Absolute: 0.1 10*3/uL (ref 0.0–0.5)
HEMATOCRIT: 34.9 % — AB (ref 38.4–49.9)
HEMOGLOBIN: 11.4 g/dL — AB (ref 13.0–17.1)
LYMPH%: 18.1 % (ref 14.0–49.0)
MCH: 30.4 pg (ref 27.2–33.4)
MCHC: 32.6 g/dL (ref 32.0–36.0)
MCV: 93.1 fL (ref 79.3–98.0)
MONO#: 0.4 10*3/uL (ref 0.1–0.9)
MONO%: 9.4 % (ref 0.0–14.0)
NEUT#: 3.1 10*3/uL (ref 1.5–6.5)
NEUT%: 66.7 % (ref 39.0–75.0)
Platelets: 175 10*3/uL (ref 140–400)
RBC: 3.74 10*6/uL — ABNORMAL LOW (ref 4.20–5.82)
RDW: 16.8 % — ABNORMAL HIGH (ref 11.0–14.6)
WBC: 4.6 10*3/uL (ref 4.0–10.3)
lymph#: 0.8 10*3/uL — ABNORMAL LOW (ref 0.9–3.3)

## 2013-09-13 LAB — URIC ACID (CC13): URIC ACID, SERUM: 5.1 mg/dL (ref 2.6–7.4)

## 2013-09-13 LAB — LACTATE DEHYDROGENASE (CC13): LDH: 209 U/L (ref 125–245)

## 2013-09-13 NOTE — Telephone Encounter (Signed)
gv pt appt schedule for aug/sept °

## 2013-09-13 NOTE — Telephone Encounter (Signed)
Message copied by Cathlean Cower on Thu Sep 13, 2013  3:02 PM ------      Message from: [facility], [place]      Created: Thu Sep 13, 2013 11:09 AM      Regarding: LDH       pls let him know the rest of labs look good      ----- Message -----         From: [facility]         Sent: 09/13/2013   9:03 AM           To: Heath Lark, MD                   ------

## 2013-09-13 NOTE — Telephone Encounter (Signed)
Informed pt of rest of labs today wnl.  He verbalized understanding.

## 2013-09-15 NOTE — Assessment & Plan Note (Signed)
Clinically, he has responded to Dasatinib with hematological remission. I recommend we continue on the same dose without dosage adjustment. 

## 2013-09-15 NOTE — Progress Notes (Signed)
[facility] OFFICE PROGRESS NOTE  Patient Care Team: [name], MD as PCP - General (Family Medicine)  SUMMARY OF ONCOLOGIC HISTORY:   Chronic myelogenous leukemia   11/01/2012 Bone Marrow Biopsy Bone marrow aspirate and biopsy was performed for leukocytosis and thrombocytosis. 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   11/07/2012 - 08/09/2013 Chemotherapy He was started on Gleevec 400 mg daily.   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    INTERVAL HISTORY: Please see below for problem oriented charting. He tolerated chemotherapy very well. Denies any side effects such as shortness of breath, diarrhea or fluid retention. He had an area of mild skin rash but does not bother him.  REVIEW OF SYSTEMS:   Constitutional: Denies fevers, chills or abnormal weight loss Eyes: Denies blurriness of vision Ears, nose, mouth, throat, and face: Denies mucositis or sore throat Respiratory: Denies cough, dyspnea or wheezes Cardiovascular: Denies palpitation, chest discomfort or lower extremity swelling Gastrointestinal:  Denies nausea, heartburn or change in bowel habits Lymphatics: Denies new lymphadenopathy or easy bruising Neurological:Denies numbness, tingling or new weaknesses Behavioral/Psych: Mood is stable, no new changes  All other systems were reviewed with the patient and are negative.  I have reviewed the past medical history, past surgical history, social history and family history with the patient and they are unchanged from previous note.  ALLERGIES:  is allergic to corticosteroids.  MEDICATIONS:  Current Outpatient Prescriptions  Medication Sig Dispense Refill  . ALOE VERA JUICE PO Take 30 mLs by mouth every morning.      . cholecalciferol  (VITAMIN D) 1000 UNITS tablet Take 2,000 Units by mouth daily.       . Coenzyme Q10 (CO Q-10) 100 MG CAPS Take 100 mg by mouth daily.      . dasatinib (SPRYCEL) 100 MG tablet Take 1 tablet (100 mg total) by mouth daily.  30 tablet  6  . fish oil-omega-3 fatty acids 1000 MG capsule Take 1 g by mouth daily.      . magnesium oxide (MAG-OX) 400 MG tablet Take 400 mg by mouth every morning.      . saw palmetto 160 MG capsule Take 160 mg by mouth daily.      . Taurine (MEGA TAURINE) 1000 MG CAPS Take 1,000 mg by mouth every morning.       No current facility-administered medications for this visit.    PHYSICAL EXAMINATION: ECOG PERFORMANCE STATUS: 0 - Asymptomatic  Filed Vitals:   09/13/13 0909  BP: 159/79  Pulse: 75  Temp: 97.6 F (36.4 C)  Resp: 18   Filed Weights   09/13/13 0909  Weight: 201 lb 12.8 oz (91.536 kg)    GENERAL:alert, no distress and comfortable SKIN: Very fine skin rash at sun exposed area. There is no ulceration the weeping. EYES: normal, Conjunctiva are pink and non-injected, sclera clear OROPHARYNX:no exudate, no erythema and lips, buccal mucosa, and tongue normal  NECK: supple, thyroid normal size, non-tender, without nodularity LYMPH:  no palpable lymphadenopathy in the cervical, axillary or inguinal LUNGS: clear to auscultation and percussion with normal breathing effort HEART: regular rate & rhythm and no murmurs and no lower extremity edema ABDOMEN:abdomen soft, non-tender and normal bowel sounds Musculoskeletal:no cyanosis of digits and no clubbing  NEURO:  alert & oriented x 3 with fluent speech, no focal motor/sensory deficits  LABORATORY DATA:  I have reviewed the data as listed    Component Value Date/Time   NA 142 09/13/2013 0842   K 4.3 09/13/2013 0842   CO2 26 09/13/2013 0842   GLUCOSE 95 09/13/2013 0842   BUN 15.0 09/13/2013 0842   CREATININE 0.8 09/13/2013 0842   CALCIUM 8.8 09/13/2013 0842   PROT 6.5 09/13/2013 0842   ALBUMIN 3.5 09/13/2013 0842   AST  16 09/13/2013 0842   ALT 14 09/13/2013 0842   ALKPHOS 72 09/13/2013 0842   BILITOT 0.48 09/13/2013 0842    No results found for this basename: SPEP, UPEP,  kappa and lambda light chains    Lab Results  Component Value Date   WBC 4.6 09/13/2013   NEUTROABS 3.1 09/13/2013   HGB 11.4* 09/13/2013   HCT 34.9* 09/13/2013   MCV 93.1 09/13/2013   PLT 175 09/13/2013      Chemistry      Component Value Date/Time   NA 142 09/13/2013 0842   K 4.3 09/13/2013 0842   CO2 26 09/13/2013 0842   BUN 15.0 09/13/2013 0842   CREATININE 0.8 09/13/2013 0842      Component Value Date/Time   CALCIUM 8.8 09/13/2013 0842   ALKPHOS 72 09/13/2013 0842   AST 16 09/13/2013 0842   ALT 14 09/13/2013 0842   BILITOT 0.48 09/13/2013 0842      ASSESSMENT & PLAN:  Chronic myelogenous leukemia Clinically, he has responded to Dasatinib with hematological remission. I recommend we continue on the same dose without dosage adjustment.  Anemia in neoplastic disease This is likely anemia of chronic disease. The patient denies recent history of bleeding such as epistaxis, hematuria or hematochezia. He is asymptomatic from the anemia. We will observe for now.    Orders Placed This Encounter  Procedures  . Comprehensive metabolic panel    Standing Status: Standing     Number of Occurrences: 3     Standing Expiration Date: 09/14/2014  . Lactate dehydrogenase    Standing Status: Standing     Number of Occurrences: 3     Standing Expiration Date: 09/14/2014  . BCR-ABL    With RT-PCR technique    Standing Status: Future     Number of Occurrences:      Standing Expiration Date: 09/13/2014   All questions were answered. The patient knows to call the clinic with any problems, questions or concerns. No barriers to learning was detected. I spent 15 minutes counseling the patient face to face. The total time spent in the appointment was 20 minutes and more than 50% was on counseling and review of test results     [facility], [place], MD 09/15/2013 1:21  PM

## 2013-09-15 NOTE — Assessment & Plan Note (Signed)
This is likely anemia of chronic disease. The patient denies recent history of bleeding such as epistaxis, hematuria or hematochezia. He is asymptomatic from the anemia. We will observe for now.  

## 2013-10-01 ENCOUNTER — Telehealth: Payer: Self-pay | Admitting: Hematology and Oncology

## 2013-10-01 NOTE — Telephone Encounter (Signed)
pt called to r/s lab...done...pt aware of new d.t °

## 2013-10-15 ENCOUNTER — Other Ambulatory Visit: Payer: BC Managed Care – PPO

## 2013-10-16 ENCOUNTER — Other Ambulatory Visit (HOSPITAL_BASED_OUTPATIENT_CLINIC_OR_DEPARTMENT_OTHER): Payer: BC Managed Care – PPO

## 2013-10-16 ENCOUNTER — Telehealth: Payer: Self-pay | Admitting: *Deleted

## 2013-10-16 DIAGNOSIS — C9210 Chronic myeloid leukemia, BCR/ABL-positive, not having achieved remission: Secondary | ICD-10-CM

## 2013-10-16 LAB — COMPREHENSIVE METABOLIC PANEL
ALT: 22 U/L (ref 0–53)
AST: 21 U/L (ref 0–37)
Albumin: 3.8 g/dL (ref 3.5–5.2)
Alkaline Phosphatase: 69 U/L (ref 39–117)
BILIRUBIN TOTAL: 0.5 mg/dL (ref 0.2–1.2)
BUN: 13 mg/dL (ref 6–23)
CO2: 26 meq/L (ref 19–32)
Calcium: 8.9 mg/dL (ref 8.4–10.5)
Chloride: 105 mEq/L (ref 96–112)
Creatinine, Ser: 0.95 mg/dL (ref 0.50–1.35)
Glucose, Bld: 119 mg/dL — ABNORMAL HIGH (ref 70–99)
Potassium: 4.4 mEq/L (ref 3.5–5.3)
SODIUM: 142 meq/L (ref 135–145)
TOTAL PROTEIN: 6.9 g/dL (ref 6.0–8.3)

## 2013-10-16 LAB — CBC WITH DIFFERENTIAL/PLATELET
BASO%: 4.4 % — AB (ref 0.0–2.0)
Basophils Absolute: 0.2 10*3/uL — ABNORMAL HIGH (ref 0.0–0.1)
EOS%: 3.2 % (ref 0.0–7.0)
Eosinophils Absolute: 0.1 10*3/uL (ref 0.0–0.5)
HCT: 39.8 % (ref 38.4–49.9)
HGB: 12.9 g/dL — ABNORMAL LOW (ref 13.0–17.1)
LYMPH#: 1.2 10*3/uL (ref 0.9–3.3)
LYMPH%: 34.5 % (ref 14.0–49.0)
MCH: 29.1 pg (ref 27.2–33.4)
MCHC: 32.4 g/dL (ref 32.0–36.0)
MCV: 89.6 fL (ref 79.3–98.0)
MONO#: 0.3 10*3/uL (ref 0.1–0.9)
MONO%: 8 % (ref 0.0–14.0)
NEUT#: 1.7 10*3/uL (ref 1.5–6.5)
NEUT%: 49.9 % (ref 39.0–75.0)
Platelets: 154 10*3/uL (ref 140–400)
RBC: 4.44 10*6/uL (ref 4.20–5.82)
RDW: 15.3 % — AB (ref 11.0–14.6)
WBC: 3.4 10*3/uL — ABNORMAL LOW (ref 4.0–10.3)

## 2013-10-16 LAB — LACTATE DEHYDROGENASE: LDH: 215 U/L (ref 94–250)

## 2013-10-16 NOTE — Telephone Encounter (Signed)
Message copied by Patton Salles on Tue Oct 16, 2013  9:16 AM ------      Message from: [facility], [place]      Created: Tue Oct 16, 2013  8:55 AM      Regarding: cbc       pls let him know labs are ok      ----- Message -----         From: [facility]         Sent: 10/16/2013   8:39 AM           To: Heath Lark, MD                   ------

## 2013-10-16 NOTE — Telephone Encounter (Signed)
Pt notified of results OK

## 2013-10-22 ENCOUNTER — Telehealth: Payer: Self-pay | Admitting: Hematology and Oncology

## 2013-10-22 NOTE — Telephone Encounter (Signed)
s.w. pt and advised on Sept appt d.t change dut to MD out of the office...pt ok and aware

## 2013-10-25 ENCOUNTER — Ambulatory Visit: Payer: BC Managed Care – PPO | Admitting: Oncology

## 2013-11-19 ENCOUNTER — Other Ambulatory Visit (HOSPITAL_BASED_OUTPATIENT_CLINIC_OR_DEPARTMENT_OTHER): Payer: BC Managed Care – PPO

## 2013-11-19 ENCOUNTER — Other Ambulatory Visit: Payer: Self-pay | Admitting: Hematology and Oncology

## 2013-11-19 ENCOUNTER — Telehealth: Payer: Self-pay | Admitting: *Deleted

## 2013-11-19 DIAGNOSIS — C9210 Chronic myeloid leukemia, BCR/ABL-positive, not having achieved remission: Secondary | ICD-10-CM

## 2013-11-19 LAB — CBC WITH DIFFERENTIAL/PLATELET
BASO%: 3.3 % — AB (ref 0.0–2.0)
Basophils Absolute: 0.1 10*3/uL (ref 0.0–0.1)
EOS%: 4.6 % (ref 0.0–7.0)
Eosinophils Absolute: 0.1 10*3/uL (ref 0.0–0.5)
HEMATOCRIT: 40 % (ref 38.4–49.9)
HGB: 13.2 g/dL (ref 13.0–17.1)
LYMPH#: 0.9 10*3/uL (ref 0.9–3.3)
LYMPH%: 37.3 % (ref 14.0–49.0)
MCH: 28.8 pg (ref 27.2–33.4)
MCHC: 33 g/dL (ref 32.0–36.0)
MCV: 87.1 fL (ref 79.3–98.0)
MONO#: 0.3 10*3/uL (ref 0.1–0.9)
MONO%: 10.4 % (ref 0.0–14.0)
NEUT#: 1.1 10*3/uL — ABNORMAL LOW (ref 1.5–6.5)
NEUT%: 44.4 % (ref 39.0–75.0)
Platelets: 142 10*3/uL (ref 140–400)
RBC: 4.59 10*6/uL (ref 4.20–5.82)
RDW: 14.8 % — ABNORMAL HIGH (ref 11.0–14.6)
WBC: 2.4 10*3/uL — ABNORMAL LOW (ref 4.0–10.3)

## 2013-11-19 LAB — COMPREHENSIVE METABOLIC PANEL (CC13)
ALK PHOS: 63 U/L (ref 40–150)
ALT: 18 U/L (ref 0–55)
AST: 22 U/L (ref 5–34)
Albumin: 3.9 g/dL (ref 3.5–5.0)
Anion Gap: 9 mEq/L (ref 3–11)
BUN: 12.6 mg/dL (ref 7.0–26.0)
CHLORIDE: 107 meq/L (ref 98–109)
CO2: 24 mEq/L (ref 22–29)
Calcium: 9 mg/dL (ref 8.4–10.4)
Creatinine: 0.9 mg/dL (ref 0.7–1.3)
Glucose: 124 mg/dl (ref 70–140)
Potassium: 3.9 mEq/L (ref 3.5–5.1)
Sodium: 141 mEq/L (ref 136–145)
Total Bilirubin: 0.71 mg/dL (ref 0.20–1.20)
Total Protein: 6.9 g/dL (ref 6.4–8.3)

## 2013-11-19 LAB — LACTATE DEHYDROGENASE (CC13): LDH: 219 U/L (ref 125–245)

## 2013-11-19 NOTE — Telephone Encounter (Signed)
Does pt need lab again same day as office visit on Wed 9/23 or before that?

## 2013-11-19 NOTE — Telephone Encounter (Signed)
Same day is fine

## 2013-11-19 NOTE — Telephone Encounter (Signed)
Message copied by Cathlean Cower on Mon Nov 19, 2013 10:49 AM ------      Message from: [facility], [place]      Created: Mon Nov 19, 2013  9:24 AM      Regarding: cbc       He is a bit neutropenic. Stay on the pill but add another lab appt before he sees me next week      ----- Message -----         From: [facility]         Sent: 11/19/2013   8:49 AM           To: Heath Lark, MD                   ------

## 2013-11-19 NOTE — Telephone Encounter (Signed)
Informed pt lab will be added next week same day as appt.   Come at 9:30 am instead of 10 am as scheduled on 9/23.  Continue to take Sprycel as ordered.  He verbalized understanding.

## 2013-11-28 ENCOUNTER — Ambulatory Visit (HOSPITAL_BASED_OUTPATIENT_CLINIC_OR_DEPARTMENT_OTHER): Payer: BC Managed Care – PPO

## 2013-11-28 ENCOUNTER — Telehealth: Payer: Self-pay | Admitting: Hematology and Oncology

## 2013-11-28 ENCOUNTER — Encounter: Payer: Self-pay | Admitting: Hematology and Oncology

## 2013-11-28 ENCOUNTER — Ambulatory Visit (HOSPITAL_BASED_OUTPATIENT_CLINIC_OR_DEPARTMENT_OTHER): Payer: BC Managed Care – PPO | Admitting: Hematology and Oncology

## 2013-11-28 VITALS — BP 164/69 | HR 64 | Temp 98.1°F | Resp 18 | Ht 76.0 in | Wt 202.7 lb

## 2013-11-28 DIAGNOSIS — C9210 Chronic myeloid leukemia, BCR/ABL-positive, not having achieved remission: Secondary | ICD-10-CM

## 2013-11-28 DIAGNOSIS — IMO0001 Reserved for inherently not codable concepts without codable children: Secondary | ICD-10-CM | POA: Insufficient documentation

## 2013-11-28 DIAGNOSIS — T451X5A Adverse effect of antineoplastic and immunosuppressive drugs, initial encounter: Secondary | ICD-10-CM

## 2013-11-28 DIAGNOSIS — D630 Anemia in neoplastic disease: Secondary | ICD-10-CM

## 2013-11-28 DIAGNOSIS — D701 Agranulocytosis secondary to cancer chemotherapy: Secondary | ICD-10-CM | POA: Insufficient documentation

## 2013-11-28 DIAGNOSIS — R569 Unspecified convulsions: Secondary | ICD-10-CM

## 2013-11-28 DIAGNOSIS — D702 Other drug-induced agranulocytosis: Secondary | ICD-10-CM

## 2013-11-28 DIAGNOSIS — D72819 Decreased white blood cell count, unspecified: Secondary | ICD-10-CM

## 2013-11-28 DIAGNOSIS — R6889 Other general symptoms and signs: Secondary | ICD-10-CM

## 2013-11-28 LAB — COMPREHENSIVE METABOLIC PANEL (CC13)
ALT: 14 U/L (ref 0–55)
ANION GAP: 7 meq/L (ref 3–11)
AST: 18 U/L (ref 5–34)
Albumin: 3.9 g/dL (ref 3.5–5.0)
Alkaline Phosphatase: 60 U/L (ref 40–150)
BILIRUBIN TOTAL: 0.62 mg/dL (ref 0.20–1.20)
BUN: 19 mg/dL (ref 7.0–26.0)
CALCIUM: 9.2 mg/dL (ref 8.4–10.4)
CO2: 27 meq/L (ref 22–29)
Chloride: 107 mEq/L (ref 98–109)
Creatinine: 1 mg/dL (ref 0.7–1.3)
GLUCOSE: 85 mg/dL (ref 70–140)
Potassium: 4.3 mEq/L (ref 3.5–5.1)
Sodium: 141 mEq/L (ref 136–145)
Total Protein: 7.1 g/dL (ref 6.4–8.3)

## 2013-11-28 LAB — CBC WITH DIFFERENTIAL/PLATELET
BASO%: 3.2 % — AB (ref 0.0–2.0)
BASOS ABS: 0.1 10*3/uL (ref 0.0–0.1)
EOS ABS: 0.1 10*3/uL (ref 0.0–0.5)
EOS%: 2.9 % (ref 0.0–7.0)
HEMATOCRIT: 41.6 % (ref 38.4–49.9)
HGB: 13.3 g/dL (ref 13.0–17.1)
LYMPH#: 1.2 10*3/uL (ref 0.9–3.3)
LYMPH%: 35.1 % (ref 14.0–49.0)
MCH: 28 pg (ref 27.2–33.4)
MCHC: 32.1 g/dL (ref 32.0–36.0)
MCV: 87.4 fL (ref 79.3–98.0)
MONO#: 0.4 10*3/uL (ref 0.1–0.9)
MONO%: 11.1 % (ref 0.0–14.0)
NEUT#: 1.7 10*3/uL (ref 1.5–6.5)
NEUT%: 47.7 % (ref 39.0–75.0)
PLATELETS: 143 10*3/uL (ref 140–400)
RBC: 4.76 10*6/uL (ref 4.20–5.82)
RDW: 15 % — ABNORMAL HIGH (ref 11.0–14.6)
WBC: 3.5 10*3/uL — ABNORMAL LOW (ref 4.0–10.3)

## 2013-11-28 NOTE — Assessment & Plan Note (Signed)
He has very mild leukopenia and not symptomatic. I recommend observation only.     

## 2013-11-28 NOTE — Assessment & Plan Note (Signed)
He had unspecified spells which I do not think represent TIA. Nevertheless, I recommend a trial of aspirin therapy.

## 2013-11-28 NOTE — Progress Notes (Signed)
[place] OFFICE PROGRESS NOTE  Patient Care Team: [name], MD as PCP - General (Family Medicine)  SUMMARY OF ONCOLOGIC HISTORY:   Chronic myelogenous leukemia   11/01/2012 Bone Marrow Biopsy Bone marrow aspirate and biopsy was performed for leukocytosis and thrombocytosis. 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   11/07/2012 - 08/09/2013 Chemotherapy He was started on Gleevec 400 mg daily.   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    INTERVAL HISTORY: Please see below for problem oriented charting. The patient had recent upper stricture tract infection, resolved. He also has unspecified spells but he feels slightly dizzy with mildly weak sensation in his legs. REVIEW OF SYSTEMS:   Constitutional: Denies fevers, chills or abnormal weight loss Eyes: Denies blurriness of vision Ears, nose, mouth, throat, and face: Denies mucositis or sore throat Respiratory: Denies cough, dyspnea or wheezes Cardiovascular: Denies palpitation, chest discomfort or lower extremity swelling Gastrointestinal:  Denies nausea, heartburn or change in bowel habits Skin: Denies abnormal skin rashes Lymphatics: Denies new lymphadenopathy or easy bruising Neurological:Denies numbness, tingling or new weaknesses Behavioral/Psych: Mood is stable, no new changes  All other systems were reviewed with the patient and are negative.  I have reviewed the past medical history, past surgical history, social history and family history with the patient and they are unchanged from previous note.  ALLERGIES:  is allergic to corticosteroids.  MEDICATIONS:  Current Outpatient Prescriptions  Medication Sig Dispense Refill  . ALOE VERA JUICE PO Take 30 mLs by mouth every morning.       . cholecalciferol (VITAMIN D) 1000 UNITS tablet Take 2,000 Units by mouth daily.       . Coenzyme Q10 (CO Q-10) 100 MG CAPS Take 100 mg by mouth daily.      . dasatinib (SPRYCEL) 100 MG tablet Take 1 tablet (100 mg total) by mouth daily.  30 tablet  6  . fish oil-omega-3 fatty acids 1000 MG capsule Take 1 g by mouth daily.      . magnesium oxide (MAG-OX) 400 MG tablet Take 400 mg by mouth every morning.      Marland Kitchen OVER THE COUNTER MEDICATION ASEA      . saw palmetto 160 MG capsule Take 160 mg by mouth daily.      . Taurine (MEGA TAURINE) 1000 MG CAPS Take 1,000 mg by mouth every morning.      . vitamin B-12 (CYANOCOBALAMIN) 1000 MCG tablet Take 1,000 mcg by mouth daily.       No current facility-administered medications for this visit.    PHYSICAL EXAMINATION: ECOG PERFORMANCE STATUS: 1 - Symptomatic but completely ambulatory  Filed Vitals:   11/28/13 0955  BP: 164/69  Pulse: 64  Temp: 98.1 F (36.7 C)  Resp: 18   Filed Weights   11/28/13 0955  Weight: 202 lb 11.2 oz (91.944 kg)    GENERAL:alert, no distress and comfortable SKIN: skin color, texture, turgor are normal, no rashes or significant lesions EYES: normal, Conjunctiva are pink and non-injected, sclera clear OROPHARYNX:no exudate, no erythema and lips, buccal mucosa, and tongue normal  NECK: supple, thyroid normal size, non-tender, without nodularity LYMPH:  no palpable lymphadenopathy in the cervical, axillary or inguinal LUNGS: clear to auscultation and percussion with normal breathing effort HEART:  regular rate & rhythm and no murmurs and no lower extremity edema ABDOMEN:abdomen soft, non-tender and normal bowel sounds Musculoskeletal:no cyanosis of digits and no clubbing  NEURO: alert & oriented x 3 with fluent speech, no focal motor/sensory deficits  LABORATORY DATA:  I have reviewed the data as listed    Component Value Date/Time   NA 141 11/28/2013 0933   NA 142 10/16/2013 0826   K 4.3 11/28/2013 0933   K 4.4  10/16/2013 0826   CL 105 10/16/2013 0826   CO2 27 11/28/2013 0933   CO2 26 10/16/2013 0826   GLUCOSE 85 11/28/2013 0933   GLUCOSE 119* 10/16/2013 0826   BUN 19.0 11/28/2013 0933   BUN 13 10/16/2013 0826   CREATININE 1.0 11/28/2013 0933   CREATININE 0.95 10/16/2013 0826   CALCIUM 9.2 11/28/2013 0933   CALCIUM 8.9 10/16/2013 0826   PROT 7.1 11/28/2013 0933   PROT 6.9 10/16/2013 0826   ALBUMIN 3.9 11/28/2013 0933   ALBUMIN 3.8 10/16/2013 0826   AST 18 11/28/2013 0933   AST 21 10/16/2013 0826   ALT 14 11/28/2013 0933   ALT 22 10/16/2013 0826   ALKPHOS 60 11/28/2013 0933   ALKPHOS 69 10/16/2013 0826   BILITOT 0.62 11/28/2013 0933   BILITOT 0.5 10/16/2013 0826    No results found for this basename: SPEP, UPEP,  kappa and lambda light chains    Lab Results  Component Value Date   WBC 3.5* 11/28/2013   NEUTROABS 1.7 11/28/2013   HGB 13.3 11/28/2013   HCT 41.6 11/28/2013   MCV 87.4 11/28/2013   PLT 143 11/28/2013      Chemistry      Component Value Date/Time   NA 141 11/28/2013 0933   NA 142 10/16/2013 0826   K 4.3 11/28/2013 0933   K 4.4 10/16/2013 0826   CL 105 10/16/2013 0826   CO2 27 11/28/2013 0933   CO2 26 10/16/2013 0826   BUN 19.0 11/28/2013 0933   BUN 13 10/16/2013 0826   CREATININE 1.0 11/28/2013 0933   CREATININE 0.95 10/16/2013 0826      Component Value Date/Time   CALCIUM 9.2 11/28/2013 0933   CALCIUM 8.9 10/16/2013 0826   ALKPHOS 60 11/28/2013 0933   ALKPHOS 69 10/16/2013 0826   AST 18 11/28/2013 0933   AST 21 10/16/2013 0826   ALT 14 11/28/2013 0933   ALT 22 10/16/2013 0826   BILITOT 0.62 11/28/2013 0933   BILITOT 0.5 10/16/2013 0826     ASSESSMENT & PLAN:  Chronic myelogenous leukemia He tolerated treatment very well without any side effects. Repeat molecular studies suggested he is improving. I will see him back in 3 months with repeat history, physical examination and blood work.  Anemia in neoplastic disease This is likely anemia of chronic disease. The patient denies recent history  of bleeding such as epistaxis, hematuria or hematochezia. He is asymptomatic from the anemia. We will observe for now.  He does not require transfusion now.    Spells He had unspecified spells which I do not think represent TIA. Nevertheless, I recommend a trial of aspirin therapy.  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 I recommend observation only.   Orders Placed This Encounter  Procedures  . Comprehensive metabolic panel (Cmet) - [facility]    Standing Status: Future     Number of Occurrences: 1     Standing Expiration Date: 11/28/2014  . Comprehensive metabolic panel    Standing Status: Future  Number of Occurrences:      Standing Expiration Date: 01/02/2015  . Lactate dehydrogenase    Standing Status: Future     Number of Occurrences:      Standing Expiration Date: 01/02/2015  . BCR-ABL    With RT-PCR technique    Standing Status: Future     Number of Occurrences:      Standing Expiration Date: 01/02/2015   All questions were answered. The patient knows to call the clinic with any problems, questions or concerns. No barriers to learning was detected. I spent 25 minutes counseling the patient face to face. The total time spent in the appointment was 30 minutes and more than 50% was on counseling and review of test results     [facility], Victor, MD 11/28/2013 3:43 PM

## 2013-11-28 NOTE — Assessment & Plan Note (Signed)
He tolerated treatment very well without any side effects. Repeat molecular studies suggested he is improving. I will see him back in 3 months with repeat history, physical examination and blood work.

## 2013-11-28 NOTE — Telephone Encounter (Signed)
gv adn printed appt sched and avs for pt for Dec °

## 2013-11-28 NOTE — Assessment & Plan Note (Signed)
This is likely anemia of chronic disease. The patient denies recent history of bleeding such as epistaxis, hematuria or hematochezia. He is asymptomatic from the anemia. We will observe for now.  He does not require transfusion now.   

## 2013-11-29 ENCOUNTER — Ambulatory Visit: Payer: BC Managed Care – PPO | Admitting: Hematology and Oncology

## 2014-02-05 ENCOUNTER — Other Ambulatory Visit: Payer: Self-pay | Admitting: Hematology and Oncology

## 2014-02-18 ENCOUNTER — Encounter: Payer: Self-pay | Admitting: Hematology and Oncology

## 2014-02-19 ENCOUNTER — Encounter: Payer: Self-pay | Admitting: *Deleted

## 2014-02-19 NOTE — Telephone Encounter (Signed)
Message left with lab manager for clarification.  Also sent request for what labs are in network.  Awaiting information about the BCR-ABL genetic test which is perhaps what is to be sent out to [facility].  The other lab tests for 02-22-2014  are cbc, cmet and lactate dehydronase.

## 2014-02-22 ENCOUNTER — Other Ambulatory Visit (HOSPITAL_BASED_OUTPATIENT_CLINIC_OR_DEPARTMENT_OTHER): Payer: BC Managed Care – PPO

## 2014-02-22 DIAGNOSIS — C9290 Myeloid leukemia, unspecified, not having achieved remission: Secondary | ICD-10-CM

## 2014-02-22 DIAGNOSIS — C9210 Chronic myeloid leukemia, BCR/ABL-positive, not having achieved remission: Secondary | ICD-10-CM

## 2014-02-22 LAB — COMPREHENSIVE METABOLIC PANEL (CC13)
ALT: 22 U/L (ref 0–55)
AST: 24 U/L (ref 5–34)
Albumin: 4 g/dL (ref 3.5–5.0)
Alkaline Phosphatase: 59 U/L (ref 40–150)
Anion Gap: 11 mEq/L (ref 3–11)
BUN: 18.7 mg/dL (ref 7.0–26.0)
CALCIUM: 9 mg/dL (ref 8.4–10.4)
CHLORIDE: 105 meq/L (ref 98–109)
CO2: 25 meq/L (ref 22–29)
Creatinine: 1 mg/dL (ref 0.7–1.3)
EGFR: 79 mL/min/{1.73_m2} — ABNORMAL LOW (ref >90)
Glucose: 96 mg/dl (ref 70–140)
POTASSIUM: 4.5 meq/L (ref 3.5–5.1)
Sodium: 141 mEq/L (ref 136–145)
Total Bilirubin: 0.87 mg/dL (ref 0.20–1.20)
Total Protein: 7 g/dL (ref 6.4–8.3)

## 2014-02-22 LAB — LACTATE DEHYDROGENASE (CC13): LDH: 228 U/L (ref 125–245)

## 2014-02-22 LAB — CBC WITH DIFFERENTIAL/PLATELET
BASO%: 1.1 % (ref 0.0–2.0)
Basophils Absolute: 0 10*3/uL (ref 0.0–0.1)
EOS%: 2.8 % (ref 0.0–7.0)
Eosinophils Absolute: 0.1 10*3/uL (ref 0.0–0.5)
HCT: 39 % (ref 38.4–49.9)
HEMOGLOBIN: 12.6 g/dL — AB (ref 13.0–17.1)
LYMPH%: 34.5 % (ref 14.0–49.0)
MCH: 29.1 pg (ref 27.2–33.4)
MCHC: 32.3 g/dL (ref 32.0–36.0)
MCV: 90.2 fL (ref 79.3–98.0)
MONO#: 0.4 10*3/uL (ref 0.1–0.9)
MONO%: 11.3 % (ref 0.0–14.0)
NEUT#: 1.8 10*3/uL (ref 1.5–6.5)
NEUT%: 50.3 % (ref 39.0–75.0)
Platelets: 145 10*3/uL (ref 140–400)
RBC: 4.33 10*6/uL (ref 4.20–5.82)
RDW: 17.6 % — AB (ref 11.0–14.6)
WBC: 3.6 10*3/uL — AB (ref 4.0–10.3)
lymph#: 1.2 10*3/uL (ref 0.9–3.3)

## 2014-03-07 ENCOUNTER — Encounter: Payer: Self-pay | Admitting: Hematology and Oncology

## 2014-03-07 ENCOUNTER — Telehealth: Payer: Self-pay | Admitting: Hematology and Oncology

## 2014-03-07 ENCOUNTER — Ambulatory Visit (HOSPITAL_BASED_OUTPATIENT_CLINIC_OR_DEPARTMENT_OTHER): Payer: BC Managed Care – PPO | Admitting: Hematology and Oncology

## 2014-03-07 VITALS — BP 153/77 | HR 75 | Temp 97.9°F | Resp 18 | Ht 76.0 in | Wt 210.0 lb

## 2014-03-07 DIAGNOSIS — T451X5A Adverse effect of antineoplastic and immunosuppressive drugs, initial encounter: Secondary | ICD-10-CM

## 2014-03-07 DIAGNOSIS — D701 Agranulocytosis secondary to cancer chemotherapy: Secondary | ICD-10-CM

## 2014-03-07 DIAGNOSIS — C9210 Chronic myeloid leukemia, BCR/ABL-positive, not having achieved remission: Secondary | ICD-10-CM

## 2014-03-07 DIAGNOSIS — D72818 Other decreased white blood cell count: Secondary | ICD-10-CM

## 2014-03-07 DIAGNOSIS — D630 Anemia in neoplastic disease: Secondary | ICD-10-CM

## 2014-03-07 NOTE — Telephone Encounter (Signed)
GV AND PRINTED APPT SCHED AND AV SFO RPT FOR mARCH 2016

## 2014-03-07 NOTE — Assessment & Plan Note (Signed)
This is likely anemia of chronic disease. The patient denies recent history of bleeding such as epistaxis, hematuria or hematochezia. He is asymptomatic from the anemia. We will observe for now.  

## 2014-03-07 NOTE — Assessment & Plan Note (Signed)
Clinically, he has responded to Dasatinib with hematological remission. I recommend we continue on the same dose without dosage adjustment.

## 2014-03-07 NOTE — Progress Notes (Signed)
[place] OFFICE PROGRESS NOTE  Patient Care Team: [name], MD as PCP - General (Family Medicine)  SUMMARY OF ONCOLOGIC HISTORY: Oncology History   Physical     Chronic myelogenous leukemia   11/01/2012 Bone Marrow Biopsy Bone marrow aspirate and biopsy was performed for leukocytosis and thrombocytosis. 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   11/07/2012 - 08/09/2013 Chemotherapy He was started on Gleevec 400 mg daily.   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   08/22/2013 -  Chemotherapy He is started on dasatinib.   11/19/2013 Tumor Marker BCR/ABL is improving   02/22/2014 Pathology Results BCR/ABL continues to improve and the patient is moving towards molecular remission    INTERVAL HISTORY: Please see below for problem oriented charting. He is doing well. Denies side effects of treatment.  REVIEW OF SYSTEMS:   Constitutional: Denies fevers, chills or abnormal weight loss Eyes: Denies blurriness of vision Ears, nose, mouth, throat, and face: Denies mucositis or sore throat Respiratory: Denies cough, dyspnea or wheezes Cardiovascular: Denies palpitation, chest discomfort or lower extremity swelling Gastrointestinal:  Denies nausea, heartburn or change in bowel habits Skin: Denies abnormal skin rashes Lymphatics: Denies new lymphadenopathy or easy bruising Neurological:Denies numbness, tingling or new weaknesses Behavioral/Psych: Mood is stable, no new changes  All other systems were reviewed with the patient and are negative.  I have reviewed the past medical history, past surgical history, social history and family history with the patient and they are unchanged from previous note.  ALLERGIES:  is allergic to  corticosteroids.  MEDICATIONS:  Current Outpatient Prescriptions  Medication Sig Dispense Refill  . ALOE VERA JUICE PO Take 30 mLs by mouth every morning.    . cholecalciferol (VITAMIN D) 1000 UNITS tablet Take 2,000 Units by mouth daily.     . Coenzyme Q10 (CO Q-10) 100 MG CAPS Take 100 mg by mouth daily.    . fish oil-omega-3 fatty acids 1000 MG capsule Take 1 g by mouth daily.    Marland Kitchen GARLIC PO Take 1 capsule by mouth daily.    . magnesium oxide (MAG-OX) 400 MG tablet Take 400 mg by mouth every morning.    Marland Kitchen OVER THE COUNTER MEDICATION ASEA    . saw palmetto 160 MG capsule Take 160 mg by mouth daily.    . SPRYCEL 100 MG tablet TAKE 1 TABLET (100 MG) DAILY 30 tablet 5  . Taurine (MEGA TAURINE) 1000 MG CAPS Take 1,000 mg by mouth every morning.    . TURMERIC CURCUMIN PO Take 1 capsule by mouth daily.    . vitamin B-12 (CYANOCOBALAMIN) 1000 MCG tablet Take 1,000 mcg by mouth daily.     No current facility-administered medications for this visit.    PHYSICAL EXAMINATION: ECOG PERFORMANCE STATUS: 0 - Asymptomatic  Filed Vitals:   03/07/14 0958  BP: 153/77  Pulse: 75  Temp: 97.9 F (36.6 C)  Resp: 18   Filed Weights   03/07/14 0958  Weight: 210 lb (95.255 kg)    GENERAL:alert, no distress and comfortable SKIN: skin color, texture, turgor are normal, no rashes or significant lesions EYES: normal, Conjunctiva are pink and non-injected, sclera clear Musculoskeletal:no cyanosis of digits and no clubbing  NEURO: alert & oriented x 3 with fluent speech, no focal  motor/sensory deficits  LABORATORY DATA:  I have reviewed the data as listed    Component Value Date/Time   NA 141 02/22/2014 0911   NA 142 10/16/2013 0826   K 4.5 02/22/2014 0911   K 4.4 10/16/2013 0826   CL 105 10/16/2013 0826   CO2 25 02/22/2014 0911   CO2 26 10/16/2013 0826   GLUCOSE 96 02/22/2014 0911   GLUCOSE 119* 10/16/2013 0826   BUN 18.7 02/22/2014 0911   BUN 13 10/16/2013 0826   CREATININE 1.0  02/22/2014 0911   CREATININE 0.95 10/16/2013 0826   CALCIUM 9.0 02/22/2014 0911   CALCIUM 8.9 10/16/2013 0826   PROT 7.0 02/22/2014 0911   PROT 6.9 10/16/2013 0826   ALBUMIN 4.0 02/22/2014 0911   ALBUMIN 3.8 10/16/2013 0826   AST 24 02/22/2014 0911   AST 21 10/16/2013 0826   ALT 22 02/22/2014 0911   ALT 22 10/16/2013 0826   ALKPHOS 59 02/22/2014 0911   ALKPHOS 69 10/16/2013 0826   BILITOT 0.87 02/22/2014 0911   BILITOT 0.5 10/16/2013 0826    No results found for: SPEP, UPEP  Lab Results  Component Value Date   WBC 3.6* 02/22/2014   NEUTROABS 1.8 02/22/2014   HGB 12.6* 02/22/2014   HCT 39.0 02/22/2014   MCV 90.2 02/22/2014   PLT 145 02/22/2014      Chemistry      Component Value Date/Time   NA 141 02/22/2014 0911   NA 142 10/16/2013 0826   K 4.5 02/22/2014 0911   K 4.4 10/16/2013 0826   CL 105 10/16/2013 0826   CO2 25 02/22/2014 0911   CO2 26 10/16/2013 0826   BUN 18.7 02/22/2014 0911   BUN 13 10/16/2013 0826   CREATININE 1.0 02/22/2014 0911   CREATININE 0.95 10/16/2013 0826      Component Value Date/Time   CALCIUM 9.0 02/22/2014 0911   CALCIUM 8.9 10/16/2013 0826   ALKPHOS 59 02/22/2014 0911   ALKPHOS 69 10/16/2013 0826   AST 24 02/22/2014 0911   AST 21 10/16/2013 0826   ALT 22 02/22/2014 0911   ALT 22 10/16/2013 0826   BILITOT 0.87 02/22/2014 0911   BILITOT 0.5 10/16/2013 0826      ASSESSMENT & PLAN:  Chronic myelogenous leukemia Clinically, he has responded to Dasatinib with hematological remission. I recommend we continue on the same dose without dosage adjustment.  Anemia in neoplastic disease This is likely anemia of chronic disease. The patient denies recent history of bleeding such as epistaxis, hematuria or hematochezia. He is asymptomatic from the anemia. We will observe for now.   Leukopenia due to antineoplastic chemotherapy He has very mild leukopenia and not symptomatic. I recommend observation only.   Orders Placed This Encounter   Procedures  . Comprehensive metabolic panel    Standing Status: Future     Number of Occurrences:      Standing Expiration Date: 04/11/2015  . Lactate dehydrogenase    Standing Status: Future     Number of Occurrences:      Standing Expiration Date: 04/11/2015  . BCR-ABL    With RT-PCR technique    Standing Status: Future     Number of Occurrences:      Standing Expiration Date: 04/11/2015   All questions were answered. The patient knows to call the clinic with any problems, questions or concerns. No barriers to learning was detected. I spent 15 minutes counseling the patient face to face. The total time spent in the appointment was 20  minutes and more than 50% was on counseling and review of test results     [facility], Boyd [place], MD 03/07/2014 12:00 PM

## 2014-03-07 NOTE — Assessment & Plan Note (Signed)
He has very mild leukopenia and not symptomatic. I recommend observation only.

## 2014-05-27 ENCOUNTER — Other Ambulatory Visit (HOSPITAL_BASED_OUTPATIENT_CLINIC_OR_DEPARTMENT_OTHER): Payer: BLUE CROSS/BLUE SHIELD

## 2014-05-27 DIAGNOSIS — C9210 Chronic myeloid leukemia, BCR/ABL-positive, not having achieved remission: Secondary | ICD-10-CM

## 2014-05-27 DIAGNOSIS — C9211 Chronic myeloid leukemia, BCR/ABL-positive, in remission: Secondary | ICD-10-CM

## 2014-05-27 LAB — CBC WITH DIFFERENTIAL/PLATELET
BASO%: 0.8 % (ref 0.0–2.0)
Basophils Absolute: 0 10*3/uL (ref 0.0–0.1)
EOS%: 4.1 % (ref 0.0–7.0)
Eosinophils Absolute: 0.1 10*3/uL (ref 0.0–0.5)
HEMATOCRIT: 41 % (ref 38.4–49.9)
HEMOGLOBIN: 13.4 g/dL (ref 13.0–17.1)
LYMPH%: 40.3 % (ref 14.0–49.0)
MCH: 30.8 pg (ref 27.2–33.4)
MCHC: 32.7 g/dL (ref 32.0–36.0)
MCV: 94.1 fL (ref 79.3–98.0)
MONO#: 0.3 10*3/uL (ref 0.1–0.9)
MONO%: 10.7 % (ref 0.0–14.0)
NEUT#: 1.3 10*3/uL — ABNORMAL LOW (ref 1.5–6.5)
NEUT%: 44.1 % (ref 39.0–75.0)
PLATELETS: 143 10*3/uL (ref 140–400)
RBC: 4.35 10*6/uL (ref 4.20–5.82)
RDW: 13.8 % (ref 11.0–14.6)
WBC: 2.9 10*3/uL — ABNORMAL LOW (ref 4.0–10.3)
lymph#: 1.2 10*3/uL (ref 0.9–3.3)

## 2014-05-27 LAB — COMPREHENSIVE METABOLIC PANEL (CC13)
ALK PHOS: 49 U/L (ref 40–150)
ALT: 24 U/L (ref 0–55)
ANION GAP: 10 meq/L (ref 3–11)
AST: 23 U/L (ref 5–34)
Albumin: 4 g/dL (ref 3.5–5.0)
BILIRUBIN TOTAL: 0.83 mg/dL (ref 0.20–1.20)
BUN: 16 mg/dL (ref 7.0–26.0)
CALCIUM: 8.9 mg/dL (ref 8.4–10.4)
CO2: 25 mEq/L (ref 22–29)
Chloride: 105 mEq/L (ref 98–109)
Creatinine: 1 mg/dL (ref 0.7–1.3)
EGFR: 81 mL/min/{1.73_m2} — AB (ref >90)
Glucose: 101 mg/dl (ref 70–140)
Potassium: 4.2 mEq/L (ref 3.5–5.1)
SODIUM: 139 meq/L (ref 136–145)
Total Protein: 7 g/dL (ref 6.4–8.3)

## 2014-05-27 LAB — LACTATE DEHYDROGENASE (CC13): LDH: 227 U/L (ref 125–245)

## 2014-06-06 ENCOUNTER — Other Ambulatory Visit: Payer: BC Managed Care – PPO

## 2014-06-06 ENCOUNTER — Encounter: Payer: Self-pay | Admitting: Hematology and Oncology

## 2014-06-06 ENCOUNTER — Telehealth: Payer: Self-pay | Admitting: Hematology and Oncology

## 2014-06-06 ENCOUNTER — Ambulatory Visit (HOSPITAL_BASED_OUTPATIENT_CLINIC_OR_DEPARTMENT_OTHER): Payer: BLUE CROSS/BLUE SHIELD | Admitting: Hematology and Oncology

## 2014-06-06 VITALS — BP 160/71 | HR 76 | Temp 98.0°F | Resp 18 | Ht 76.0 in | Wt 211.0 lb

## 2014-06-06 DIAGNOSIS — C9210 Chronic myeloid leukemia, BCR/ABL-positive, not having achieved remission: Secondary | ICD-10-CM

## 2014-06-06 DIAGNOSIS — R030 Elevated blood-pressure reading, without diagnosis of hypertension: Secondary | ICD-10-CM

## 2014-06-06 DIAGNOSIS — D701 Agranulocytosis secondary to cancer chemotherapy: Secondary | ICD-10-CM | POA: Diagnosis not present

## 2014-06-06 DIAGNOSIS — T451X5A Adverse effect of antineoplastic and immunosuppressive drugs, initial encounter: Secondary | ICD-10-CM

## 2014-06-06 DIAGNOSIS — IMO0001 Reserved for inherently not codable concepts without codable children: Secondary | ICD-10-CM

## 2014-06-06 DIAGNOSIS — I10 Essential (primary) hypertension: Secondary | ICD-10-CM | POA: Insufficient documentation

## 2014-06-06 HISTORY — DX: Reserved for inherently not codable concepts without codable children: IMO0001

## 2014-06-06 NOTE — Assessment & Plan Note (Signed)
His blood pressure is elevated but the patient is not symptomatic. 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 We will observe only.

## 2014-06-06 NOTE — Progress Notes (Signed)
[place] OFFICE PROGRESS NOTE  Patient Care Team: [name], MD as PCP - General (Family Medicine)  SUMMARY OF ONCOLOGIC HISTORY: Oncology History   Physical     Chronic myelogenous leukemia   11/01/2012 Bone Marrow Biopsy Bone marrow aspirate and biopsy was performed for leukocytosis and thrombocytosis. 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   11/07/2012 - 08/09/2013 Chemotherapy He was started on Gleevec 400 mg daily.   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   08/22/2013 -  Chemotherapy He is started on dasatinib.   11/19/2013 Tumor Marker BCR/ABL is improving   02/22/2014 Pathology Results BCR/ABL continues to improve and the patient is moving towards molecular remission   05/27/2014 Pathology Results BCR/ABL b2a2 & b3a2 both are 0.05%, b2a2 & b3a2 IS both are 0.028%. Patient in MMR    INTERVAL HISTORY: Please see below for problem oriented charting. He feels well. Completely asymptomatic. Denies side effects of treatment. Denies fatigue, diarrhea or skin rashes. No shortness of breath.  REVIEW OF SYSTEMS:   Constitutional: Denies fevers, chills or abnormal weight loss Eyes: Denies blurriness of vision Ears, nose, mouth, throat, and face: Denies mucositis or sore throat Respiratory: Denies cough, dyspnea or wheezes Cardiovascular: Denies palpitation, chest discomfort or lower extremity swelling Gastrointestinal:  Denies nausea, heartburn or change in bowel habits Skin: Denies abnormal skin rashes Lymphatics: Denies new lymphadenopathy or easy bruising Neurological:Denies numbness, tingling or new weaknesses Behavioral/Psych: Mood is stable, no new changes  All other systems were reviewed with the patient and are negative.  I  have reviewed the past medical history, past surgical history, social history and family history with the patient and they are unchanged from previous note.  ALLERGIES:  is allergic to corticosteroids.  MEDICATIONS:  Current Outpatient Prescriptions  Medication Sig Dispense Refill  . ALFALFA PO Take by mouth daily.    . ALOE VERA JUICE PO Take 30 mLs by mouth every morning.    . cholecalciferol (VITAMIN D) 1000 UNITS tablet Take 2,000 Units by mouth daily.     . Coenzyme Q10 (CO Q-10) 100 MG CAPS Take 100 mg by mouth daily.    Marland Kitchen DANDELION PO Take by mouth 2 (two) times daily. supplement    . fish oil-omega-3 fatty acids 1000 MG capsule Take 1 g by mouth daily.    Marland Kitchen GARLIC PO Take 1 capsule by mouth daily.    . magnesium oxide (MAG-OX) 400 MG tablet Take 400 mg by mouth every morning.    Marland Kitchen OVER THE COUNTER MEDICATION ASEA    . saw palmetto 160 MG capsule Take 160 mg by mouth daily.    . SPRYCEL 100 MG tablet TAKE 1 TABLET (100 MG) DAILY 30 tablet 5  . Taurine (MEGA TAURINE) 1000 MG CAPS Take 1,000 mg by mouth every morning.    . TURMERIC CURCUMIN PO Take 1 capsule by mouth daily.    . vitamin B-12 (CYANOCOBALAMIN) 1000 MCG tablet Take 1,000 mcg by mouth daily.     No current facility-administered medications for this visit.    PHYSICAL EXAMINATION: ECOG PERFORMANCE STATUS: 0 - Asymptomatic  Filed Vitals:   06/06/14 0917  BP: 160/71  Pulse: 76  Temp: 98 F (36.7 C)  Resp: 18   Filed Weights  06/06/14 0917  Weight: 211 lb (95.709 kg)    GENERAL:alert, no distress and comfortable SKIN: skin color, texture, turgor are normal, no rashes or significant lesions EYES: normal, Conjunctiva are pink and non-injected, sclera clear OROPHARYNX:no exudate, no erythema and lips, buccal mucosa, and tongue normal  NECK: supple, thyroid normal size, non-tender, without nodularity LYMPH:  no palpable lymphadenopathy in the cervical, axillary or inguinal LUNGS: clear to auscultation and  percussion with normal breathing effort HEART: regular rate & rhythm and no murmurs and no lower extremity edema ABDOMEN:abdomen soft, non-tender and normal bowel sounds Musculoskeletal:no cyanosis of digits and no clubbing  NEURO: alert & oriented x 3 with fluent speech, no focal motor/sensory deficits  LABORATORY DATA:  I have reviewed the data as listed    Component Value Date/Time   NA 139 05/27/2014 0848   NA 142 10/16/2013 0826   K 4.2 05/27/2014 0848   K 4.4 10/16/2013 0826   CL 105 10/16/2013 0826   CO2 25 05/27/2014 0848   CO2 26 10/16/2013 0826   GLUCOSE 101 05/27/2014 0848   GLUCOSE 119* 10/16/2013 0826   BUN 16.0 05/27/2014 0848   BUN 13 10/16/2013 0826   CREATININE 1.0 05/27/2014 0848   CREATININE 0.95 10/16/2013 0826   CALCIUM 8.9 05/27/2014 0848   CALCIUM 8.9 10/16/2013 0826   PROT 7.0 05/27/2014 0848   PROT 6.9 10/16/2013 0826   ALBUMIN 4.0 05/27/2014 0848   ALBUMIN 3.8 10/16/2013 0826   AST 23 05/27/2014 0848   AST 21 10/16/2013 0826   ALT 24 05/27/2014 0848   ALT 22 10/16/2013 0826   ALKPHOS 49 05/27/2014 0848   ALKPHOS 69 10/16/2013 0826   BILITOT 0.83 05/27/2014 0848   BILITOT 0.5 10/16/2013 0826    No results found for: SPEP, UPEP  Lab Results  Component Value Date   WBC 2.9* 05/27/2014   NEUTROABS 1.3* 05/27/2014   HGB 13.4 05/27/2014   HCT 41.0 05/27/2014   MCV 94.1 05/27/2014   PLT 143 05/27/2014      Chemistry      Component Value Date/Time   NA 139 05/27/2014 0848   NA 142 10/16/2013 0826   K 4.2 05/27/2014 0848   K 4.4 10/16/2013 0826   CL 105 10/16/2013 0826   CO2 25 05/27/2014 0848   CO2 26 10/16/2013 0826   BUN 16.0 05/27/2014 0848   BUN 13 10/16/2013 0826   CREATININE 1.0 05/27/2014 0848   CREATININE 0.95 10/16/2013 0826      Component Value Date/Time   CALCIUM 8.9 05/27/2014 0848   CALCIUM 8.9 10/16/2013 0826   ALKPHOS 49 05/27/2014 0848   ALKPHOS 69 10/16/2013 0826   AST 23 05/27/2014 0848   AST 21 10/16/2013  0826   ALT 24 05/27/2014 0848   ALT 22 10/16/2013 0826   BILITOT 0.83 05/27/2014 0848   BILITOT 0.5 10/16/2013 0826      ASSESSMENT & PLAN:  Chronic myelogenous leukemia Clinically, he has responded to Dasatinib with major molecular remission. I recommend we continue on the same dose without dosage adjustment.     Leukopenia due to antineoplastic chemotherapy He has very mild leukopenia and not symptomatic. I recommend observation only.     White coat hypertension His blood pressure is elevated but the patient is not symptomatic. 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 We will observe only.     Orders Placed This Encounter  Procedures  . Comprehensive metabolic  panel    Standing Status: Future     Number of Occurrences:      Standing Expiration Date: 07/11/2015  . Lactate dehydrogenase    Standing Status: Future     Number of Occurrences:      Standing Expiration Date: 07/11/2015  . BCR-ABL    With RT-PCR technique    Standing Status: Future     Number of Occurrences:      Standing Expiration Date: 07/11/2015   All questions were answered. The patient knows to call the clinic with any problems, questions or concerns. No barriers to learning was detected. I spent 15 minutes counseling the patient face to face. The total time spent in the appointment was 20 minutes and more than 50% was on counseling and review of test results     [facility], Joana Nolton, MD 06/06/2014 10:37 AM

## 2014-06-06 NOTE — Assessment & Plan Note (Signed)
Clinically, he has responded to Dasatinib with major molecular remission. I recommend we continue on the same dose without dosage adjustment.

## 2014-06-06 NOTE — Telephone Encounter (Signed)
Gave avs & calendar for July °

## 2014-06-06 NOTE — Assessment & Plan Note (Signed)
He has very mild leukopenia and not symptomatic. I recommend observation only.

## 2014-08-02 ENCOUNTER — Other Ambulatory Visit: Payer: Self-pay | Admitting: Hematology and Oncology

## 2014-09-02 ENCOUNTER — Other Ambulatory Visit: Payer: Self-pay

## 2014-09-26 ENCOUNTER — Other Ambulatory Visit (HOSPITAL_BASED_OUTPATIENT_CLINIC_OR_DEPARTMENT_OTHER): Payer: BLUE CROSS/BLUE SHIELD

## 2014-09-26 DIAGNOSIS — C9210 Chronic myeloid leukemia, BCR/ABL-positive, not having achieved remission: Secondary | ICD-10-CM

## 2014-09-26 LAB — CBC WITH DIFFERENTIAL/PLATELET
BASO%: 0.6 % (ref 0.0–2.0)
BASOS ABS: 0 10*3/uL (ref 0.0–0.1)
EOS%: 2.7 % (ref 0.0–7.0)
Eosinophils Absolute: 0.1 10*3/uL (ref 0.0–0.5)
HCT: 40 % (ref 38.4–49.9)
HGB: 13.6 g/dL (ref 13.0–17.1)
LYMPH%: 42.3 % (ref 14.0–49.0)
MCH: 31.7 pg (ref 27.2–33.4)
MCHC: 34 g/dL (ref 32.0–36.0)
MCV: 93.2 fL (ref 79.3–98.0)
MONO#: 0.3 10*3/uL (ref 0.1–0.9)
MONO%: 9.8 % (ref 0.0–14.0)
NEUT#: 1.5 10*3/uL (ref 1.5–6.5)
NEUT%: 44.6 % (ref 39.0–75.0)
PLATELETS: 143 10*3/uL (ref 140–400)
RBC: 4.29 10*6/uL (ref 4.20–5.82)
RDW: 14.5 % (ref 11.0–14.6)
WBC: 3.4 10*3/uL — ABNORMAL LOW (ref 4.0–10.3)
lymph#: 1.4 10*3/uL (ref 0.9–3.3)

## 2014-09-26 LAB — COMPREHENSIVE METABOLIC PANEL (CC13)
ALBUMIN: 4.1 g/dL (ref 3.5–5.0)
ALK PHOS: 52 U/L (ref 40–150)
ALT: 19 U/L (ref 0–55)
AST: 22 U/L (ref 5–34)
Anion Gap: 7 mEq/L (ref 3–11)
BUN: 15.6 mg/dL (ref 7.0–26.0)
CO2: 25 mEq/L (ref 22–29)
Calcium: 9.3 mg/dL (ref 8.4–10.4)
Chloride: 109 mEq/L (ref 98–109)
Creatinine: 1 mg/dL (ref 0.7–1.3)
EGFR: 78 mL/min/{1.73_m2} — ABNORMAL LOW (ref >90)
Glucose: 107 mg/dl (ref 70–140)
Potassium: 4.1 mEq/L (ref 3.5–5.1)
Sodium: 140 mEq/L (ref 136–145)
TOTAL PROTEIN: 7.1 g/dL (ref 6.4–8.3)
Total Bilirubin: 0.74 mg/dL (ref 0.20–1.20)

## 2014-09-26 LAB — LACTATE DEHYDROGENASE (CC13): LDH: 203 U/L (ref 125–245)

## 2014-10-03 ENCOUNTER — Telehealth: Payer: Self-pay | Admitting: Hematology and Oncology

## 2014-10-03 ENCOUNTER — Ambulatory Visit (HOSPITAL_BASED_OUTPATIENT_CLINIC_OR_DEPARTMENT_OTHER): Payer: BLUE CROSS/BLUE SHIELD | Admitting: Hematology and Oncology

## 2014-10-03 ENCOUNTER — Encounter: Payer: Self-pay | Admitting: Hematology and Oncology

## 2014-10-03 VITALS — BP 165/68 | HR 78 | Temp 98.5°F | Resp 20 | Ht 76.0 in | Wt 209.0 lb

## 2014-10-03 DIAGNOSIS — T451X5A Adverse effect of antineoplastic and immunosuppressive drugs, initial encounter: Secondary | ICD-10-CM

## 2014-10-03 DIAGNOSIS — C9210 Chronic myeloid leukemia, BCR/ABL-positive, not having achieved remission: Secondary | ICD-10-CM

## 2014-10-03 DIAGNOSIS — D701 Agranulocytosis secondary to cancer chemotherapy: Secondary | ICD-10-CM | POA: Diagnosis not present

## 2014-10-03 DIAGNOSIS — C9290 Myeloid leukemia, unspecified, not having achieved remission: Secondary | ICD-10-CM | POA: Diagnosis not present

## 2014-10-03 DIAGNOSIS — IMO0001 Reserved for inherently not codable concepts without codable children: Secondary | ICD-10-CM

## 2014-10-03 DIAGNOSIS — R030 Elevated blood-pressure reading, without diagnosis of hypertension: Secondary | ICD-10-CM | POA: Diagnosis not present

## 2014-10-03 NOTE — Progress Notes (Signed)
[place] OFFICE PROGRESS NOTE  Patient Care Team: [name], MD as PCP - General (Family Medicine)  SUMMARY OF ONCOLOGIC HISTORY: Oncology History   Physical     Chronic myelogenous leukemia   11/01/2012 Bone Marrow Biopsy Bone marrow aspirate and biopsy was performed for leukocytosis and thrombocytosis. 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   11/07/2012 - 08/09/2013 Chemotherapy He was started on Gleevec 400 mg daily.   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   08/22/2013 -  Chemotherapy He is started on dasatinib.   11/19/2013 Tumor Marker BCR/ABL is improving   02/22/2014 Pathology Results BCR/ABL continues to improve and the patient is moving towards molecular remission   05/27/2014 Pathology Results BCR/ABL b2a2 & b3a2 both are 0.05%, b2a2 & b3a2 IS both are 0.028%. Patient in MMR   09/26/2014 Pathology Results Repeat BCR/ABL was not detectable     INTERVAL HISTORY: Please see below for problem oriented charting. He feels well. Completely asymptomatic. Denies side effects of treatment. Denies fatigue, diarrhea or skin rashes. No shortness of breath.   REVIEW OF SYSTEMS:   Constitutional: Denies fevers, chills or abnormal weight loss Eyes: Denies blurriness of vision Ears, nose, mouth, throat, and face: Denies mucositis or sore throat Respiratory: Denies cough, dyspnea or wheezes Cardiovascular: Denies palpitation, chest discomfort or lower extremity swelling Gastrointestinal:  Denies nausea, heartburn or change in bowel habits Skin: Denies abnormal skin rashes Lymphatics: Denies new lymphadenopathy or easy bruising Neurological:Denies numbness, tingling or new weaknesses Behavioral/Psych: Mood is stable, no new changes  All  other systems were reviewed with the patient and are negative.  I have reviewed the past medical history, past surgical history, social history and family history with the patient and they are unchanged from previous note.  ALLERGIES:  is allergic to corticosteroids.  MEDICATIONS:  Current Outpatient Prescriptions  Medication Sig Dispense Refill  . ALFALFA PO Take by mouth daily.    . ALOE VERA JUICE PO Take 30 mLs by mouth every morning.    . cholecalciferol (VITAMIN D) 1000 UNITS tablet Take 2,000 Units by mouth daily.     . Coenzyme Q10 (CO Q-10) 100 MG CAPS Take 100 mg by mouth daily.    Marland Kitchen DANDELION PO Take by mouth 2 (two) times daily. supplement    . fish oil-omega-3 fatty acids 1000 MG capsule Take 1 g by mouth daily.    Marland Kitchen GARLIC PO Take 1 capsule by mouth daily.    . magnesium oxide (MAG-OX) 400 MG tablet Take 400 mg by mouth every morning.    Marland Kitchen OVER THE COUNTER MEDICATION ASEA    . saw palmetto 160 MG capsule Take 160 mg by mouth daily.    . SPRYCEL 100 MG tablet TAKE 1 TABLET (100 MG) DAILY 30 tablet 4  . Taurine (MEGA TAURINE) 1000 MG CAPS Take 1,000 mg by mouth every morning.    . TURMERIC CURCUMIN PO Take 1 capsule by mouth daily.    . vitamin B-12 (CYANOCOBALAMIN) 1000 MCG tablet Take 1,000 mcg by mouth daily.     No current facility-administered medications for this visit.    PHYSICAL EXAMINATION: ECOG PERFORMANCE STATUS: 0 - Asymptomatic  Filed Vitals:   10/03/14 0817  BP: 165/68  Pulse: 78  Temp:  98.5 F (36.9 C)  Resp: 20   Filed Weights   10/03/14 0817  Weight: 209 lb (94.802 kg)    GENERAL:alert, no distress and comfortable SKIN: skin color, texture, turgor are normal, no rashes or significant lesions EYES: normal, Conjunctiva are pink and non-injected, sclera clear OROPHARYNX:no exudate, no erythema and lips, buccal mucosa, and tongue normal  NECK: supple, thyroid normal size, non-tender, without nodularity LYMPH:  no palpable lymphadenopathy in  the cervical, axillary or inguinal LUNGS: clear to auscultation and percussion with normal breathing effort HEART: regular rate & rhythm and no murmurs and no lower extremity edema ABDOMEN:abdomen soft, non-tender and normal bowel sounds Musculoskeletal:no cyanosis of digits and no clubbing  NEURO: alert & oriented x 3 with fluent speech, no focal motor/sensory deficits  LABORATORY DATA:  I have reviewed the data as listed    Component Value Date/Time   NA 140 09/26/2014 0829   NA 142 10/16/2013 0826   K 4.1 09/26/2014 0829   K 4.4 10/16/2013 0826   CL 105 10/16/2013 0826   CO2 25 09/26/2014 0829   CO2 26 10/16/2013 0826   GLUCOSE 107 09/26/2014 0829   GLUCOSE 119* 10/16/2013 0826   BUN 15.6 09/26/2014 0829   BUN 13 10/16/2013 0826   CREATININE 1.0 09/26/2014 0829   CREATININE 0.95 10/16/2013 0826   CALCIUM 9.3 09/26/2014 0829   CALCIUM 8.9 10/16/2013 0826   PROT 7.1 09/26/2014 0829   PROT 6.9 10/16/2013 0826   ALBUMIN 4.1 09/26/2014 0829   ALBUMIN 3.8 10/16/2013 0826   AST 22 09/26/2014 0829   AST 21 10/16/2013 0826   ALT 19 09/26/2014 0829   ALT 22 10/16/2013 0826   ALKPHOS 52 09/26/2014 0829   ALKPHOS 69 10/16/2013 0826   BILITOT 0.74 09/26/2014 0829   BILITOT 0.5 10/16/2013 0826    No results found for: SPEP, UPEP  Lab Results  Component Value Date   WBC 3.4* 09/26/2014   NEUTROABS 1.5 09/26/2014   HGB 13.6 09/26/2014   HCT 40.0 09/26/2014   MCV 93.2 09/26/2014   PLT 143 09/26/2014      Chemistry      Component Value Date/Time   NA 140 09/26/2014 0829   NA 142 10/16/2013 0826   K 4.1 09/26/2014 0829   K 4.4 10/16/2013 0826   CL 105 10/16/2013 0826   CO2 25 09/26/2014 0829   CO2 26 10/16/2013 0826   BUN 15.6 09/26/2014 0829   BUN 13 10/16/2013 0826   CREATININE 1.0 09/26/2014 0829   CREATININE 0.95 10/16/2013 0826      Component Value Date/Time   CALCIUM 9.3 09/26/2014 0829   CALCIUM 8.9 10/16/2013 0826   ALKPHOS 52 09/26/2014 0829   ALKPHOS  69 10/16/2013 0826   AST 22 09/26/2014 0829   AST 21 10/16/2013 0826   ALT 19 09/26/2014 0829   ALT 22 10/16/2013 0826   BILITOT 0.74 09/26/2014 0829   BILITOT 0.5 10/16/2013 0826      ASSESSMENT & PLAN:  Chronic myelogenous leukemia Clinically, he has responded to Dasatinib with major molecular remission. I recommend we continue on the same dose without dosage adjustment.     Leukopenia due to antineoplastic chemotherapy He has very mild leukopenia and not symptomatic. I recommend observation only.      White coat hypertension His blood pressure is elevated but the patient is not symptomatic. 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 We will observe only.  Orders Placed This Encounter  Procedures  . Comprehensive metabolic panel    Standing Status: Future     Number of Occurrences:      Standing Expiration Date: 11/07/2015  . BCR-ABL    With RT-PCR technique    Standing Status: Future     Number of Occurrences:      Standing Expiration Date: 11/07/2015   All questions were answered. The patient knows to call the clinic with any problems, questions or concerns. No barriers to learning was detected. I spent 15 minutes counseling the patient face to face. The total time spent in the appointment was 20 minutes and more than 50% was on counseling and review of test results     [facility], [place], MD 10/03/2014 10:41 AM

## 2014-10-03 NOTE — Assessment & Plan Note (Signed)
Clinically, he has responded to Dasatinib with major molecular remission. I recommend we continue on the same dose without dosage adjustment.

## 2014-10-03 NOTE — Assessment & Plan Note (Signed)
His blood pressure is elevated but the patient is not symptomatic. 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 We will observe only.

## 2014-10-03 NOTE — Assessment & Plan Note (Signed)
He has very mild leukopenia and not symptomatic. I recommend observation only.

## 2014-10-03 NOTE — Telephone Encounter (Signed)
Gave and printed appts ched and avs for pt for DEC  °

## 2015-01-07 ENCOUNTER — Other Ambulatory Visit: Payer: Self-pay | Admitting: Hematology and Oncology

## 2015-02-25 ENCOUNTER — Other Ambulatory Visit (HOSPITAL_BASED_OUTPATIENT_CLINIC_OR_DEPARTMENT_OTHER): Payer: BLUE CROSS/BLUE SHIELD

## 2015-02-25 DIAGNOSIS — C9210 Chronic myeloid leukemia, BCR/ABL-positive, not having achieved remission: Secondary | ICD-10-CM

## 2015-02-25 LAB — CBC WITH DIFFERENTIAL/PLATELET
BASO%: 0.8 % (ref 0.0–2.0)
Basophils Absolute: 0 10*3/uL (ref 0.0–0.1)
EOS ABS: 0.1 10*3/uL (ref 0.0–0.5)
EOS%: 2.7 % (ref 0.0–7.0)
HCT: 40 % (ref 38.4–49.9)
HGB: 13.2 g/dL (ref 13.0–17.1)
LYMPH%: 38.6 % (ref 14.0–49.0)
MCH: 31 pg (ref 27.2–33.4)
MCHC: 33.1 g/dL (ref 32.0–36.0)
MCV: 93.8 fL (ref 79.3–98.0)
MONO#: 0.5 10*3/uL (ref 0.1–0.9)
MONO%: 11.9 % (ref 0.0–14.0)
NEUT%: 46 % (ref 39.0–75.0)
NEUTROS ABS: 1.8 10*3/uL (ref 1.5–6.5)
Platelets: 138 10*3/uL — ABNORMAL LOW (ref 140–400)
RBC: 4.27 10*6/uL (ref 4.20–5.82)
RDW: 13.8 % (ref 11.0–14.6)
WBC: 3.8 10*3/uL — AB (ref 4.0–10.3)
lymph#: 1.5 10*3/uL (ref 0.9–3.3)

## 2015-02-25 LAB — COMPREHENSIVE METABOLIC PANEL
ALT: 21 U/L (ref 0–55)
AST: 21 U/L (ref 5–34)
Albumin: 3.9 g/dL (ref 3.5–5.0)
Alkaline Phosphatase: 45 U/L (ref 40–150)
Anion Gap: 7 mEq/L (ref 3–11)
BUN: 21.5 mg/dL (ref 7.0–26.0)
CHLORIDE: 107 meq/L (ref 98–109)
CO2: 25 meq/L (ref 22–29)
Calcium: 9.1 mg/dL (ref 8.4–10.4)
Creatinine: 1.1 mg/dL (ref 0.7–1.3)
EGFR: 74 mL/min/{1.73_m2} — AB (ref >90)
GLUCOSE: 77 mg/dL (ref 70–140)
POTASSIUM: 4.3 meq/L (ref 3.5–5.1)
SODIUM: 140 meq/L (ref 136–145)
TOTAL PROTEIN: 6.9 g/dL (ref 6.4–8.3)
Total Bilirubin: 0.72 mg/dL (ref 0.20–1.20)

## 2015-03-07 ENCOUNTER — Ambulatory Visit (HOSPITAL_BASED_OUTPATIENT_CLINIC_OR_DEPARTMENT_OTHER): Payer: BLUE CROSS/BLUE SHIELD | Admitting: Hematology and Oncology

## 2015-03-07 ENCOUNTER — Telehealth: Payer: Self-pay | Admitting: Hematology and Oncology

## 2015-03-07 ENCOUNTER — Encounter: Payer: Self-pay | Admitting: Hematology and Oncology

## 2015-03-07 VITALS — BP 171/95 | HR 67 | Temp 98.0°F | Resp 18 | Ht 76.0 in | Wt 213.1 lb

## 2015-03-07 DIAGNOSIS — R030 Elevated blood-pressure reading, without diagnosis of hypertension: Secondary | ICD-10-CM

## 2015-03-07 DIAGNOSIS — D649 Anemia, unspecified: Secondary | ICD-10-CM

## 2015-03-07 DIAGNOSIS — D61810 Antineoplastic chemotherapy induced pancytopenia: Secondary | ICD-10-CM

## 2015-03-07 DIAGNOSIS — C9210 Chronic myeloid leukemia, BCR/ABL-positive, not having achieved remission: Secondary | ICD-10-CM | POA: Diagnosis not present

## 2015-03-07 DIAGNOSIS — IMO0001 Reserved for inherently not codable concepts without codable children: Secondary | ICD-10-CM

## 2015-03-07 DIAGNOSIS — T451X5A Adverse effect of antineoplastic and immunosuppressive drugs, initial encounter: Secondary | ICD-10-CM

## 2015-03-07 NOTE — Assessment & Plan Note (Signed)
His blood pressure is elevated but the patient is not symptomatic. 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 We will observe only.    

## 2015-03-07 NOTE — Telephone Encounter (Signed)
Gave patient avs report and appointments for June.  °

## 2015-03-07 NOTE — Assessment & Plan Note (Signed)
Clinically, he has responded to Dasatinib with major molecular remission. I recommend we continue on the same dose without dosage adjustment.   

## 2015-03-07 NOTE — Progress Notes (Signed)
[place] OFFICE PROGRESS NOTE  Patient Care Team: [name], MD as PCP - General (Family Medicine)  SUMMARY OF ONCOLOGIC HISTORY: Oncology History   Physical     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[place])   11/01/2012 Bone Marrow Biopsy Bone marrow aspirate and biopsy was performed for leukocytosis and thrombocytosis. 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   11/07/2012 - 08/09/2013 Chemotherapy He was started on Gleevec 400 mg daily.   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   08/22/2013 -  Chemotherapy He is started on dasatinib.   11/19/2013 Tumor Marker BCR/ABL is improving   02/22/2014 Pathology Results BCR/ABL continues to improve and the patient is moving towards molecular remission   05/27/2014 Pathology Results BCR/ABL b2a2 & b3a2 both are 0.05%, b2a2 & b3a2 IS both are 0.028%. Patient in MMR   09/26/2014 Pathology Results Repeat BCR/ABL was not detectable   02/28/2015 Tumor Marker Repeat BCR/ABL was not detectable    INTERVAL HISTORY: Please see below for problem oriented charting. He returns for further follow-up. He tolerated treatment well. Denies recent infection. He has gained a lot of weight and have shortness of breath on moderate exertion. Denies recent diarrhea. No recent cough.  REVIEW OF SYSTEMS:   Constitutional: Denies fevers, chills or abnormal weight loss Eyes: Denies blurriness of vision Ears, nose, mouth, throat, and face: Denies mucositis or sore throat Cardiovascular: Denies palpitation, chest discomfort or lower extremity swelling Gastrointestinal:  Denies nausea, heartburn or change in bowel habits Skin: Denies abnormal skin rashes Lymphatics: Denies new lymphadenopathy or easy bruising Neurological:Denies  numbness, tingling or new weaknesses Behavioral/Psych: Mood is stable, no new changes  All other systems were reviewed with the patient and are negative.  I have reviewed the past medical history, past surgical history, social history and family history with the patient and they are unchanged from previous note.  ALLERGIES:  is allergic to corticosteroids.  MEDICATIONS:  Current Outpatient Prescriptions  Medication Sig Dispense Refill  . ALFALFA PO Take by mouth daily.    . ALOE VERA JUICE PO Take 30 mLs by mouth every morning.    . cholecalciferol (VITAMIN D) 1000 UNITS tablet Take 2,000 Units by mouth daily.     . Coenzyme Q10 (CO Q-10) 100 MG CAPS Take 100 mg by mouth daily.    Marland Kitchen DANDELION PO Take by mouth 2 (two) times daily. supplement    . fish oil-omega-3 fatty acids 1000 MG capsule Take 1 g by mouth daily.    Marland Kitchen GARLIC PO Take 1 capsule by mouth daily.    . magnesium oxide (MAG-OX) 400 MG tablet Take 400 mg by mouth every morning.    Marland Kitchen OVER THE COUNTER MEDICATION ASEA    . saw palmetto 160 MG capsule Take 160 mg by mouth daily.    . SPRYCEL 100 MG tablet TAKE 1 TABLET DAILY 30 tablet 3  . Taurine (MEGA TAURINE) 1000 MG CAPS Take 1,000 mg by mouth every morning.    . TURMERIC CURCUMIN PO Take 1 capsule by mouth daily.    . vitamin B-12 (CYANOCOBALAMIN) 1000 MCG tablet Take 1,000 mcg by mouth daily.     No current facility-administered medications for this visit.    PHYSICAL EXAMINATION: ECOG PERFORMANCE STATUS: 1 - Symptomatic but  completely ambulatory  Filed Vitals:   03/07/15 0836  BP: 171/95  Pulse: 67  Temp: 98 F (36.7 C)  Resp: 18   Filed Weights   03/07/15 0836  Weight: 213 lb 1.6 oz (96.662 kg)    GENERAL:alert, no distress and comfortable. He is mildly obese SKIN: skin color, texture, turgor are normal, no rashes or significant lesions EYES: normal, Conjunctiva are pink and non-injected, sclera clear OROPHARYNX:no exudate, no erythema and lips, buccal  mucosa, and tongue normal  NECK: supple, thyroid normal size, non-tender, without nodularity LYMPH:  no palpable lymphadenopathy in the cervical, axillary or inguinal LUNGS: clear to auscultation and percussion with normal breathing effort HEART: regular rate & rhythm and no murmurs and no lower extremity edema ABDOMEN:abdomen soft, non-tender and normal bowel sounds Musculoskeletal:no cyanosis of digits and no clubbing  NEURO: alert & oriented x 3 with fluent speech, no focal motor/sensory deficits  LABORATORY DATA:  I have reviewed the data as listed    Component Value Date/Time   NA 140 02/25/2015 0836   NA 142 10/16/2013 0826   K 4.3 02/25/2015 0836   K 4.4 10/16/2013 0826   CL 105 10/16/2013 0826   CO2 25 02/25/2015 0836   CO2 26 10/16/2013 0826   GLUCOSE 77 02/25/2015 0836   GLUCOSE 119* 10/16/2013 0826   BUN 21.5 02/25/2015 0836   BUN 13 10/16/2013 0826   CREATININE 1.1 02/25/2015 0836   CREATININE 0.95 10/16/2013 0826   CALCIUM 9.1 02/25/2015 0836   CALCIUM 8.9 10/16/2013 0826   PROT 6.9 02/25/2015 0836   PROT 6.9 10/16/2013 0826   ALBUMIN 3.9 02/25/2015 0836   ALBUMIN 3.8 10/16/2013 0826   AST 21 02/25/2015 0836   AST 21 10/16/2013 0826   ALT 21 02/25/2015 0836   ALT 22 10/16/2013 0826   ALKPHOS 45 02/25/2015 0836   ALKPHOS 69 10/16/2013 0826   BILITOT 0.72 02/25/2015 0836   BILITOT 0.5 10/16/2013 0826    No results found for: SPEP, UPEP  Lab Results  Component Value Date   WBC 3.8* 02/25/2015   NEUTROABS 1.8 02/25/2015   HGB 13.2 02/25/2015   HCT 40.0 02/25/2015   MCV 93.8 02/25/2015   PLT 138* 02/25/2015      Chemistry      Component Value Date/Time   NA 140 02/25/2015 0836   NA 142 10/16/2013 0826   K 4.3 02/25/2015 0836   K 4.4 10/16/2013 0826   CL 105 10/16/2013 0826   CO2 25 02/25/2015 0836   CO2 26 10/16/2013 0826   BUN 21.5 02/25/2015 0836   BUN 13 10/16/2013 0826   CREATININE 1.1 02/25/2015 0836   CREATININE 0.95 10/16/2013 0826       Component Value Date/Time   CALCIUM 9.1 02/25/2015 0836   CALCIUM 8.9 10/16/2013 0826   ALKPHOS 45 02/25/2015 0836   ALKPHOS 69 10/16/2013 0826   AST 21 02/25/2015 0836   AST 21 10/16/2013 0826   ALT 21 02/25/2015 0836   ALT 22 10/16/2013 0826   BILITOT 0.72 02/25/2015 0836   BILITOT 0.5 10/16/2013 0826      ASSESSMENT & PLAN:  Chronic myelogenous leukemia Clinically, he has responded to Dasatinib with major molecular remission. I recommend we continue on the same dose without dosage adjustment.    White coat hypertension His blood pressure is elevated but the patient is not symptomatic. 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 We will observe only.  Pancytopenia due to antineoplastic chemotherapy [facility]) He has very mild leukopenia and anemia but not symptomatic. I recommend observation only.       Orders Placed This Encounter  Procedures  . Comprehensive metabolic panel    Standing Status: Future     Number of Occurrences:      Standing Expiration Date: 04/10/2016  . BCR-ABL    With RT-PCR technique    Standing Status: Future     Number of Occurrences:      Standing Expiration Date: 04/10/2016   All questions were answered. The patient knows to call the clinic with any problems, questions or concerns. No barriers to learning was detected. I spent 15 minutes counseling the patient face to face. The total time spent in the appointment was 20 minutes and more than 50% was on counseling and review of test results     [facility] [employer], MD 03/07/2015 9:31 AM

## 2015-03-07 NOTE — Assessment & Plan Note (Signed)
He has very mild leukopenia and anemia but not symptomatic. I recommend observation only.

## 2015-04-18 ENCOUNTER — Encounter: Payer: Self-pay | Admitting: Hematology and Oncology

## 2015-04-18 NOTE — Progress Notes (Signed)
Per patient his labs should be going to [facility] for processing based on his insurance- they are in network with his plan. He will be back in june

## 2015-04-18 NOTE — Progress Notes (Signed)
I advised patient I would not and on appt desk for his June appt.See prev notes

## 2015-04-23 ENCOUNTER — Other Ambulatory Visit: Payer: Self-pay | Admitting: Hematology and Oncology

## 2015-07-15 ENCOUNTER — Other Ambulatory Visit: Payer: Self-pay | Admitting: Hematology and Oncology

## 2015-08-28 ENCOUNTER — Ambulatory Visit (HOSPITAL_BASED_OUTPATIENT_CLINIC_OR_DEPARTMENT_OTHER): Payer: BLUE CROSS/BLUE SHIELD

## 2015-08-28 DIAGNOSIS — C9210 Chronic myeloid leukemia, BCR/ABL-positive, not having achieved remission: Secondary | ICD-10-CM | POA: Diagnosis not present

## 2015-08-28 LAB — CBC WITH DIFFERENTIAL/PLATELET
BASO%: 0.9 % (ref 0.0–2.0)
BASOS ABS: 0 10*3/uL (ref 0.0–0.1)
EOS%: 2.7 % (ref 0.0–7.0)
Eosinophils Absolute: 0.1 10*3/uL (ref 0.0–0.5)
HCT: 40.2 % (ref 38.4–49.9)
HEMOGLOBIN: 13.4 g/dL (ref 13.0–17.1)
LYMPH%: 39.2 % (ref 14.0–49.0)
MCH: 30.5 pg (ref 27.2–33.4)
MCHC: 33.3 g/dL (ref 32.0–36.0)
MCV: 91.5 fL (ref 79.3–98.0)
MONO#: 0.4 10*3/uL (ref 0.1–0.9)
MONO%: 12.3 % (ref 0.0–14.0)
NEUT#: 1.5 10*3/uL (ref 1.5–6.5)
NEUT%: 44.9 % (ref 39.0–75.0)
Platelets: 143 10*3/uL (ref 140–400)
RBC: 4.39 10*6/uL (ref 4.20–5.82)
RDW: 14.1 % (ref 11.0–14.6)
WBC: 3.3 10*3/uL — ABNORMAL LOW (ref 4.0–10.3)
lymph#: 1.3 10*3/uL (ref 0.9–3.3)

## 2015-08-28 LAB — COMPREHENSIVE METABOLIC PANEL
ALBUMIN: 3.9 g/dL (ref 3.5–5.0)
ALT: 19 U/L (ref 0–55)
AST: 21 U/L (ref 5–34)
Alkaline Phosphatase: 55 U/L (ref 40–150)
Anion Gap: 9 mEq/L (ref 3–11)
BUN: 20.4 mg/dL (ref 7.0–26.0)
CHLORIDE: 106 meq/L (ref 98–109)
CO2: 25 mEq/L (ref 22–29)
Calcium: 9.1 mg/dL (ref 8.4–10.4)
Creatinine: 1.1 mg/dL (ref 0.7–1.3)
EGFR: 68 mL/min/{1.73_m2} — ABNORMAL LOW (ref >90)
GLUCOSE: 92 mg/dL (ref 70–140)
POTASSIUM: 4.3 meq/L (ref 3.5–5.1)
SODIUM: 139 meq/L (ref 136–145)
Total Bilirubin: 0.72 mg/dL (ref 0.20–1.20)
Total Protein: 7.3 g/dL (ref 6.4–8.3)

## 2015-09-02 LAB — BCR-ABL1, CML/ALL, PCR, QUANT

## 2015-09-03 ENCOUNTER — Other Ambulatory Visit: Payer: Self-pay | Admitting: Hematology and Oncology

## 2015-09-05 ENCOUNTER — Encounter: Payer: Self-pay | Admitting: Hematology and Oncology

## 2015-09-05 ENCOUNTER — Telehealth: Payer: Self-pay | Admitting: Hematology and Oncology

## 2015-09-05 ENCOUNTER — Ambulatory Visit (HOSPITAL_BASED_OUTPATIENT_CLINIC_OR_DEPARTMENT_OTHER): Payer: BLUE CROSS/BLUE SHIELD | Admitting: Hematology and Oncology

## 2015-09-05 VITALS — BP 164/68 | HR 80 | Temp 98.3°F | Resp 18 | Ht 76.0 in | Wt 214.3 lb

## 2015-09-05 DIAGNOSIS — D701 Agranulocytosis secondary to cancer chemotherapy: Secondary | ICD-10-CM

## 2015-09-05 DIAGNOSIS — C9210 Chronic myeloid leukemia, BCR/ABL-positive, not having achieved remission: Secondary | ICD-10-CM | POA: Diagnosis not present

## 2015-09-05 DIAGNOSIS — R030 Elevated blood-pressure reading, without diagnosis of hypertension: Secondary | ICD-10-CM

## 2015-09-05 DIAGNOSIS — D72819 Decreased white blood cell count, unspecified: Secondary | ICD-10-CM | POA: Diagnosis not present

## 2015-09-05 DIAGNOSIS — T451X5A Adverse effect of antineoplastic and immunosuppressive drugs, initial encounter: Secondary | ICD-10-CM

## 2015-09-05 DIAGNOSIS — IMO0001 Reserved for inherently not codable concepts without codable children: Secondary | ICD-10-CM

## 2015-09-05 MED ORDER — DASATINIB 70 MG PO TABS: 70.0000 mg | ORAL_TABLET | Freq: Every day | ORAL | Status: DC

## 2015-09-05 NOTE — Assessment & Plan Note (Signed)
Clinically, he has responded to Dasatinib with major molecular remission. Due to persistent leukopenia, I plan to reduce the dose of 70 mg daily in the future and see him back in October to assess response to Rx

## 2015-09-05 NOTE — Telephone Encounter (Signed)
Gave and printed appt sched and avs fo rpt for OCT and NOV  °

## 2015-09-05 NOTE — Assessment & Plan Note (Signed)
His blood pressure is elevated but the patient is not symptomatic. 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 We will observe only.    

## 2015-09-05 NOTE — Progress Notes (Signed)
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   11/07/2012 - 08/09/2013 Chemotherapy He was started on Gleevec 400 mg daily.   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   08/22/2013 -  Chemotherapy He is started on dasatinib.   11/19/2013 Tumor Marker BCR/ABL is improving   02/22/2014 Pathology Results BCR/ABL continues to improve and the patient is moving towards molecular remission   05/27/2014 Pathology Results BCR/ABL b2a2 & b3a2 both are 0.05%, b2a2 & b3a2 IS both are 0.028%. Patient in MMR   09/26/2014 Pathology Results Repeat BCR/ABL was not detectable   02/28/2015 Tumor Marker Repeat BCR/ABL was not detectable   08/28/2015 Tumor Marker Repeat BCR/ABL was not detectable    INTERVAL HISTORY: Please see below for problem oriented charting. He returns for further follow-up. He denies side effects such as shortness of breath, cough, fluid retention or diarrhea. Denies recent infection.  REVIEW OF SYSTEMS:   Constitutional: Denies fevers, chills or abnormal weight loss Eyes: Denies blurriness of vision Ears, nose, mouth, throat, and face: Denies mucositis or sore throat Respiratory: Denies cough, dyspnea or wheezes Cardiovascular: Denies palpitation, chest discomfort or lower extremity swelling Gastrointestinal:  Denies nausea, heartburn or change in bowel habits Skin: Denies abnormal skin rashes Lymphatics: Denies new  lymphadenopathy or easy bruising Neurological:Denies numbness, tingling or new weaknesses Behavioral/Psych: Mood is stable, no new changes  All other systems were reviewed with the patient and are negative.  I have reviewed the past medical history, past surgical history, social history and family history with the patient and they are unchanged from previous note.  ALLERGIES:  is allergic to corticosteroids.  MEDICATIONS:  Current Outpatient Prescriptions  Medication Sig Dispense Refill  . ALFALFA PO Take by mouth daily.    . ALOE VERA JUICE PO Take 30 mLs by mouth every morning.    . cholecalciferol (VITAMIN D) 1000 UNITS tablet Take 2,000 Units by mouth daily.     . Coenzyme Q10 (CO Q-10) 100 MG CAPS Take 100 mg by mouth daily.    Marland Kitchen DANDELION PO Take by mouth 2 (two) times daily. supplement    . dasatinib (SPRYCEL) 70 MG tablet Take 1 tablet (70 mg total) by mouth daily. 90 tablet 11  . fish oil-omega-3 fatty acids 1000 MG capsule Take 1 g by mouth daily.    Marland Kitchen GARLIC PO Take 1 capsule by mouth daily.    . magnesium oxide (MAG-OX) 400 MG tablet Take 400 mg by mouth every morning.    Marland Kitchen OVER THE COUNTER MEDICATION ASEA    . saw palmetto 160 MG capsule Take 160 mg by mouth daily.    . Taurine (MEGA TAURINE) 1000 MG CAPS Take 1,000 mg by mouth every morning.    . TURMERIC CURCUMIN PO Take 1 capsule by mouth daily.    . vitamin B-12 (CYANOCOBALAMIN) 1000 MCG tablet Take 1,000 mcg by mouth daily.     No current facility-administered medications for this visit.  PHYSICAL EXAMINATION: ECOG PERFORMANCE STATUS: 0 - Asymptomatic  Filed Vitals:   09/05/15 0836  BP: 164/68  Pulse: 80  Temp: 98.3 F (36.8 C)  Resp: 18   Filed Weights   09/05/15 0836  Weight: 214 lb 4.8 oz (97.206 kg)    GENERAL:alert, no distress and comfortable SKIN: skin color, texture, turgor are normal, no rashes or significant lesions EYES: normal, Conjunctiva are pink and non-injected, sclera  clear OROPHARYNX:no exudate, no erythema and lips, buccal mucosa, and tongue normal  NECK: supple, thyroid normal size, non-tender, without nodularity LYMPH:  no palpable lymphadenopathy in the cervical, axillary or inguinal LUNGS: clear to auscultation and percussion with normal breathing effort HEART: regular rate & rhythm and no murmurs and no lower extremity edema ABDOMEN:abdomen soft, non-tender and normal bowel sounds Musculoskeletal:no cyanosis of digits and no clubbing  NEURO: alert & oriented x 3 with fluent speech, no focal motor/sensory deficits  LABORATORY DATA:  I have reviewed the data as listed    Component Value Date/Time   NA 139 08/28/2015 1108   NA 142 10/16/2013 0826   K 4.3 08/28/2015 1108   K 4.4 10/16/2013 0826   CL 105 10/16/2013 0826   CO2 25 08/28/2015 1108   CO2 26 10/16/2013 0826   GLUCOSE 92 08/28/2015 1108   GLUCOSE 119* 10/16/2013 0826   BUN 20.4 08/28/2015 1108   BUN 13 10/16/2013 0826   CREATININE 1.1 08/28/2015 1108   CREATININE 0.95 10/16/2013 0826   CALCIUM 9.1 08/28/2015 1108   CALCIUM 8.9 10/16/2013 0826   PROT 7.3 08/28/2015 1108   PROT 6.9 10/16/2013 0826   ALBUMIN 3.9 08/28/2015 1108   ALBUMIN 3.8 10/16/2013 0826   AST 21 08/28/2015 1108   AST 21 10/16/2013 0826   ALT 19 08/28/2015 1108   ALT 22 10/16/2013 0826   ALKPHOS 55 08/28/2015 1108   ALKPHOS 69 10/16/2013 0826   BILITOT 0.72 08/28/2015 1108   BILITOT 0.5 10/16/2013 0826    No results found for: SPEP, UPEP  Lab Results  Component Value Date   WBC 3.3* 08/28/2015   NEUTROABS 1.5 08/28/2015   HGB 13.4 08/28/2015   HCT 40.2 08/28/2015   MCV 91.5 08/28/2015   PLT 143 08/28/2015      Chemistry      Component Value Date/Time   NA 139 08/28/2015 1108   NA 142 10/16/2013 0826   K 4.3 08/28/2015 1108   K 4.4 10/16/2013 0826   CL 105 10/16/2013 0826   CO2 25 08/28/2015 1108   CO2 26 10/16/2013 0826   BUN 20.4 08/28/2015 1108   BUN 13 10/16/2013 0826   CREATININE  1.1 08/28/2015 1108   CREATININE 0.95 10/16/2013 0826      Component Value Date/Time   CALCIUM 9.1 08/28/2015 1108   CALCIUM 8.9 10/16/2013 0826   ALKPHOS 55 08/28/2015 1108   ALKPHOS 69 10/16/2013 0826   AST 21 08/28/2015 1108   AST 21 10/16/2013 0826   ALT 19 08/28/2015 1108   ALT 22 10/16/2013 0826   BILITOT 0.72 08/28/2015 1108   BILITOT 0.5 10/16/2013 0826      ASSESSMENT & PLAN:  Chronic myelogenous leukemia Clinically, he has responded to Dasatinib with major molecular remission. Due to persistent leukopenia, I plan to reduce the dose of 70 mg daily in the future and see him back in October to assess response to Rx   Leukopenia due to antineoplastic chemotherapy This is likely due to recent treatment. The patient denies recent  history of fevers, cough, chills, diarrhea or dysuria. He is asymptomatic from the leukopenia. I will observe for now.   I plan to reduce the dose of treatment as above.  White coat hypertension His blood pressure is elevated but the patient is not symptomatic. 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 We will observe only.      Orders Placed This Encounter  Procedures  . CBC with Differential/Platelet    Standing Status: Future     Number of Occurrences:      Standing Expiration Date: 10/09/2016  . Comprehensive metabolic panel    Standing Status: Future     Number of Occurrences:      Standing Expiration Date: 10/09/2016  . BCR-ABL    With RT-PCR technique    Standing Status: Future     Number of Occurrences:      Standing Expiration Date: 10/09/2016   All questions were answered. The patient knows to call the clinic with any problems, questions or concerns. No barriers to learning was detected. I spent 15 minutes counseling the patient face to face. The total time spent in the appointment was 20 minutes and more than 50% was on counseling and review of test results     [facility],  Sinan Tuch, MD 09/05/2015 10:05 AM

## 2015-09-05 NOTE — Assessment & Plan Note (Signed)
This is likely due to recent treatment. The patient denies recent history of fevers, cough, chills, diarrhea or dysuria. He is asymptomatic from the leukopenia. I will observe for now.   I plan to reduce the dose of treatment as above.

## 2015-09-15 ENCOUNTER — Telehealth: Payer: Self-pay | Admitting: *Deleted

## 2015-09-15 MED ORDER — DASATINIB 70 MG PO TABS: 70.0000 mg | ORAL_TABLET | Freq: Every day | ORAL | Status: DC

## 2015-09-15 NOTE — Telephone Encounter (Signed)
Pt left VM states needs refill on Sprycel at new reduced dose 70 mg sent to [facility].  E-scribed refill 70 mg to [facility] and informed pt of refill sent.  He verbalized understanding.

## 2015-10-08 ENCOUNTER — Encounter: Payer: Self-pay | Admitting: Hematology and Oncology

## 2015-10-16 DIAGNOSIS — R972 Elevated prostate specific antigen [PSA]: Secondary | ICD-10-CM

## 2015-10-16 DIAGNOSIS — N400 Enlarged prostate without lower urinary tract symptoms: Secondary | ICD-10-CM | POA: Insufficient documentation

## 2015-10-22 DIAGNOSIS — Z125 Encounter for screening for malignant neoplasm of prostate: Secondary | ICD-10-CM | POA: Insufficient documentation

## 2015-10-22 DIAGNOSIS — Z0000 Encounter for general adult medical examination without abnormal findings: Secondary | ICD-10-CM | POA: Insufficient documentation

## 2016-01-05 ENCOUNTER — Other Ambulatory Visit (HOSPITAL_BASED_OUTPATIENT_CLINIC_OR_DEPARTMENT_OTHER): Payer: BLUE CROSS/BLUE SHIELD

## 2016-01-05 DIAGNOSIS — C9210 Chronic myeloid leukemia, BCR/ABL-positive, not having achieved remission: Secondary | ICD-10-CM

## 2016-01-05 LAB — CBC WITH DIFFERENTIAL/PLATELET
BASO%: 0.9 % (ref 0.0–2.0)
Basophils Absolute: 0 10*3/uL (ref 0.0–0.1)
EOS%: 2 % (ref 0.0–7.0)
Eosinophils Absolute: 0.1 10*3/uL (ref 0.0–0.5)
HCT: 42.1 % (ref 38.4–49.9)
HEMOGLOBIN: 13.9 g/dL (ref 13.0–17.1)
LYMPH#: 0.9 10*3/uL (ref 0.9–3.3)
LYMPH%: 27.9 % (ref 14.0–49.0)
MCH: 30.4 pg (ref 27.2–33.4)
MCHC: 33.1 g/dL (ref 32.0–36.0)
MCV: 92 fL (ref 79.3–98.0)
MONO#: 0.4 10*3/uL (ref 0.1–0.9)
MONO%: 11.4 % (ref 0.0–14.0)
NEUT%: 57.8 % (ref 39.0–75.0)
NEUTROS ABS: 1.9 10*3/uL (ref 1.5–6.5)
PLATELETS: 155 10*3/uL (ref 140–400)
RBC: 4.58 10*6/uL (ref 4.20–5.82)
RDW: 13.5 % (ref 11.0–14.6)
WBC: 3.3 10*3/uL — AB (ref 4.0–10.3)

## 2016-01-05 LAB — COMPREHENSIVE METABOLIC PANEL
ALT: 20 U/L (ref 0–55)
ANION GAP: 9 meq/L (ref 3–11)
AST: 22 U/L (ref 5–34)
Albumin: 4 g/dL (ref 3.5–5.0)
Alkaline Phosphatase: 66 U/L (ref 40–150)
BILIRUBIN TOTAL: 0.59 mg/dL (ref 0.20–1.20)
BUN: 17.4 mg/dL (ref 7.0–26.0)
CO2: 25 meq/L (ref 22–29)
CREATININE: 1 mg/dL (ref 0.7–1.3)
Calcium: 9.3 mg/dL (ref 8.4–10.4)
Chloride: 106 mEq/L (ref 98–109)
EGFR: 76 mL/min/{1.73_m2} — ABNORMAL LOW (ref >90)
Glucose: 76 mg/dl (ref 70–140)
Potassium: 4.2 mEq/L (ref 3.5–5.1)
Sodium: 140 mEq/L (ref 136–145)
TOTAL PROTEIN: 7.5 g/dL (ref 6.4–8.3)

## 2016-01-08 LAB — BCR-ABL1, CML/ALL, PCR, QUANT

## 2016-01-13 ENCOUNTER — Encounter: Payer: Self-pay | Admitting: Hematology and Oncology

## 2016-01-13 ENCOUNTER — Ambulatory Visit (HOSPITAL_BASED_OUTPATIENT_CLINIC_OR_DEPARTMENT_OTHER): Payer: BLUE CROSS/BLUE SHIELD | Admitting: Hematology and Oncology

## 2016-01-13 ENCOUNTER — Telehealth: Payer: Self-pay | Admitting: Hematology and Oncology

## 2016-01-13 VITALS — BP 172/70 | HR 69 | Temp 97.6°F | Resp 18 | Ht 76.0 in | Wt 217.9 lb

## 2016-01-13 DIAGNOSIS — C9210 Chronic myeloid leukemia, BCR/ABL-positive, not having achieved remission: Secondary | ICD-10-CM | POA: Diagnosis not present

## 2016-01-13 DIAGNOSIS — I10 Essential (primary) hypertension: Secondary | ICD-10-CM

## 2016-01-13 DIAGNOSIS — R030 Elevated blood-pressure reading, without diagnosis of hypertension: Secondary | ICD-10-CM | POA: Diagnosis not present

## 2016-01-13 DIAGNOSIS — T451X5A Adverse effect of antineoplastic and immunosuppressive drugs, initial encounter: Secondary | ICD-10-CM

## 2016-01-13 DIAGNOSIS — D701 Agranulocytosis secondary to cancer chemotherapy: Secondary | ICD-10-CM | POA: Diagnosis not present

## 2016-01-13 MED ORDER — DASATINIB 50 MG PO TABS: 50.0000 mg | ORAL_TABLET | Freq: Every day | ORAL | 11 refills | Status: DC

## 2016-01-13 NOTE — Assessment & Plan Note (Signed)
His blood pressure is elevated but the patient is not symptomatic. 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 We will observe only.    

## 2016-01-13 NOTE — Progress Notes (Signed)
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      11/07/2012 - 08/09/2013 Chemotherapy    He was started on Gleevec 400 mg daily.      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      08/22/2013 -  Chemotherapy    He is started on dasatinib.      11/19/2013 Tumor Marker    BCR/ABL is improving      02/22/2014 Pathology Results    BCR/ABL continues to improve and the patient is moving towards molecular remission      05/27/2014 Pathology Results    BCR/ABL b2a2 & b3a2 both are 0.05%, b2a2 & b3a2 IS both are 0.028%. Patient in MMR      09/26/2014 Pathology Results    Repeat BCR/ABL was not detectable      02/28/2015 Tumor Marker    Repeat BCR/ABL was not detectable      08/28/2015 Tumor Marker    Repeat BCR/ABL was not detectable      01/06/2016 Pathology Results    Repeat BCR/ABL was not detectable       INTERVAL HISTORY: Please see below for problem oriented charting. He returns for follow-up. He denies recent infection. He has minor weight gain His blood pressure is elevated but he is not symptomatic. His blood pressure at home were within normal limits He tolerated treatment well without shortness of breath, diarrhea or skin rashes  REVIEW OF SYSTEMS:   Constitutional: Denies fevers, chills or abnormal weight loss Eyes: Denies blurriness of vision Ears, nose, mouth, throat, and face:  Denies mucositis or sore throat Respiratory: Denies cough, dyspnea or wheezes Cardiovascular: Denies palpitation, chest discomfort or lower extremity swelling Gastrointestinal:  Denies nausea, heartburn or change in bowel habits Skin: Denies abnormal skin rashes Lymphatics: Denies new lymphadenopathy or easy bruising Neurological:Denies numbness, tingling or new weaknesses Behavioral/Psych: Mood is stable, no new changes  All other systems were reviewed with the patient and are negative.  I have reviewed the past medical history, past surgical history, social history and family history with the patient and they are unchanged from previous note.  ALLERGIES:  is allergic to corticosteroids.  MEDICATIONS:  Current Outpatient Prescriptions  Medication Sig Dispense Refill  . ALOE VERA JUICE PO Take 30 mLs by mouth every morning.    . cholecalciferol (VITAMIN D) 1000 UNITS tablet Take 2,000 Units by mouth daily.     . Coenzyme Q10 (CO Q-10) 100 MG CAPS Take 100 mg by mouth daily.    Marland Kitchen DANDELION PO Take by mouth 2 (two) times daily. supplement    . dasatinib (SPRYCEL) 50 MG tablet Take 1 tablet (50 mg total) by mouth daily. 90 tablet 11  . fish oil-omega-3 fatty acids 1000 MG capsule Take 1 g by mouth daily.    Marland Kitchen GARLIC PO Take 1 capsule by mouth daily.    . magnesium  oxide (MAG-OX) 400 MG tablet Take 400 mg by mouth every morning.    Marland Kitchen OVER THE COUNTER MEDICATION ASEA    . saw palmetto 160 MG capsule Take 160 mg by mouth daily.    . Taurine (MEGA TAURINE) 1000 MG CAPS Take 1,000 mg by mouth every morning.    . TURMERIC CURCUMIN PO Take 1 capsule by mouth daily.    . vitamin B-12 (CYANOCOBALAMIN) 1000 MCG tablet Take 1,000 mcg by mouth daily.    Marland Kitchen ALFALFA PO Take by mouth daily.     No current facility-administered medications for this visit.     PHYSICAL EXAMINATION: ECOG PERFORMANCE STATUS: 0 - Asymptomatic  Vitals:   01/13/16 0842  BP: (!) 172/70  Pulse: 69  Resp: 18  Temp:  97.6 F (36.4 C)   Filed Weights   01/13/16 0842  Weight: 217 lb 14.4 oz (98.8 kg)    GENERAL:alert, no distress and comfortable SKIN: skin color, texture, turgor are normal, no rashes or significant lesions EYES: normal, Conjunctiva are pink and non-injected, sclera clear OROPHARYNX:no exudate, no erythema and lips, buccal mucosa, and tongue normal  NECK: supple, thyroid normal size, non-tender, without nodularity LYMPH:  no palpable lymphadenopathy in the cervical, axillary or inguinal LUNGS: clear to auscultation and percussion with normal breathing effort HEART: regular rate & rhythm and no murmurs and no lower extremity edema ABDOMEN:abdomen soft, non-tender and normal bowel sounds Musculoskeletal:no cyanosis of digits and no clubbing  NEURO: alert & oriented x 3 with fluent speech, no focal motor/sensory deficits  LABORATORY DATA:  I have reviewed the data as listed    Component Value Date/Time   NA 140 01/05/2016 1128   K 4.2 01/05/2016 1128   CL 105 10/16/2013 0826   CO2 25 01/05/2016 1128   GLUCOSE 76 01/05/2016 1128   BUN 17.4 01/05/2016 1128   CREATININE 1.0 01/05/2016 1128   CALCIUM 9.3 01/05/2016 1128   PROT 7.5 01/05/2016 1128   ALBUMIN 4.0 01/05/2016 1128   AST 22 01/05/2016 1128   ALT 20 01/05/2016 1128   ALKPHOS 66 01/05/2016 1128   BILITOT 0.59 01/05/2016 1128    No results found for: SPEP, UPEP  Lab Results  Component Value Date   WBC 3.3 (L) 01/05/2016   NEUTROABS 1.9 01/05/2016   HGB 13.9 01/05/2016   HCT 42.1 01/05/2016   MCV 92.0 01/05/2016   PLT 155 01/05/2016      Chemistry      Component Value Date/Time   NA 140 01/05/2016 1128   K 4.2 01/05/2016 1128   CL 105 10/16/2013 0826   CO2 25 01/05/2016 1128   BUN 17.4 01/05/2016 1128   CREATININE 1.0 01/05/2016 1128      Component Value Date/Time   CALCIUM 9.3 01/05/2016 1128   ALKPHOS 66 01/05/2016 1128   AST 22 01/05/2016 1128   ALT 20 01/05/2016 1128   BILITOT 0.59 01/05/2016  1128       ASSESSMENT & PLAN:  Chronic myelogenous leukemia His recent blood work suggests that he is still in major molecular response with undetectable BCR/ABL Due to mild leukopenia, we agreed to lower his dose to 50 mg starting next cycle We discussed current guidelines and recommendation regarding discontinuation of treatment. At this point in time, he does not meet the criteria for discontinuation I will see him back in 3 months with repeat blood work history and examination  Leukopenia due to antineoplastic chemotherapy This is likely due to recent treatment. The patient  denies recent history of fevers, cough, chills, diarrhea or dysuria. He is asymptomatic from the leukopenia. I will observe for now.   I plan to reduce the dose of treatment as above.  White coat syndrome with hypertension His blood pressure is elevated but the patient is not symptomatic. 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 We will observe only.      Orders Placed This Encounter  Procedures  . CBC with Differential/Platelet    Standing Status:   Future    Standing Expiration Date:   02/16/2017  . Comprehensive metabolic panel    Standing Status:   Future    Standing Expiration Date:   02/16/2017  . BCR-ABL    With RT-PCR technique    Standing Status:   Future    Standing Expiration Date:   02/16/2017   All questions were answered. The patient knows to call the clinic with any problems, questions or concerns. No barriers to learning was detected. I spent 15 minutes counseling the patient face to face. The total time spent in the appointment was 20 minutes and more than 50% was on counseling and review of test results     Heath Lark, MD 01/13/2016 9:20 AM

## 2016-01-13 NOTE — Assessment & Plan Note (Signed)
His recent blood work suggests that he is still in major molecular response with undetectable BCR/ABL Due to mild leukopenia, we agreed to lower his dose to 50 mg starting next cycle We discussed current guidelines and recommendation regarding discontinuation of treatment. At this point in time, he does not meet the criteria for discontinuation I will see him back in 3 months with repeat blood work history and examination

## 2016-01-13 NOTE — Telephone Encounter (Signed)
Appointments scheduled per 01/13/16 los. AVS report and appointment schedule given to patient, per 01/13/16 los. °

## 2016-01-13 NOTE — Assessment & Plan Note (Signed)
This is likely due to recent treatment. The patient denies recent history of fevers, cough, chills, diarrhea or dysuria. He is asymptomatic from the leukopenia. I will observe for now.   I plan to reduce the dose of treatment as above.

## 2016-01-15 ENCOUNTER — Encounter: Payer: Self-pay | Admitting: Pharmacist

## 2016-01-15 ENCOUNTER — Other Ambulatory Visit: Payer: Self-pay | Admitting: *Deleted

## 2016-01-15 MED ORDER — DASATINIB 50 MG PO TABS: 50.0000 mg | ORAL_TABLET | Freq: Every day | ORAL | 11 refills | Status: DC

## 2016-01-15 NOTE — Progress Notes (Signed)
Oral Chemotherapy Pharmacist Encounter  Received printed prescription for dose change of dasatinib for patient. Upon further investigation, patient has been receiving his dasatinib from [facility]. New Rx will be e-scribed by Cameo, RN to [facility] in [place], [place] for further benefits analysis.  Oral Chemo Clinic will continue to follow.  Johny Drilling, PharmD, BCPS 01/15/2016  2:03 PM Oral Chemotherapy Clinic [phone]

## 2016-01-23 ENCOUNTER — Ambulatory Visit (HOSPITAL_COMMUNITY)
Admission: EM | Admit: 2016-01-23 | Discharge: 2016-01-23 | Disposition: A | Discharge status: Home / Self Care | Payer: BLUE CROSS/BLUE SHIELD | Attending: Family Medicine | Admitting: Family Medicine

## 2016-01-23 ENCOUNTER — Encounter (HOSPITAL_COMMUNITY): Payer: Self-pay | Admitting: *Deleted

## 2016-01-23 DIAGNOSIS — J069 Acute upper respiratory infection, unspecified: Secondary | ICD-10-CM | POA: Diagnosis not present

## 2016-01-23 MED ORDER — IPRATROPIUM BROMIDE 0.06 % NA SOLN: 2.0000 | Freq: Four times a day (QID) | NASAL | 1 refills | Status: DC

## 2016-01-23 MED ORDER — HYDROCOD POLST-CPM POLST ER 10-8 MG/5ML PO SUER: 5.0000 mL | Freq: Two times a day (BID) | ORAL | 1 refills | Status: DC | PRN

## 2016-01-23 NOTE — Discharge Instructions (Signed)
Drink plenty of fluids as discussed, use medicine as prescribed, and mucinex or delsym for cough. Return or see your doctor if further problems °

## 2016-01-23 NOTE — ED Provider Notes (Signed)
[facility]    CSN: [id number] Arrival date & time: 01/23/16  1041     History   Chief Complaint No chief complaint on file.   HPI Larry Vasquez is a 65 y.o. male.   The history is provided by the patient.  URI  Presenting symptoms: congestion, cough and rhinorrhea   Presenting symptoms: no fever and no sore throat   Severity:  Moderate Onset quality:  Gradual Duration:  1 week Progression:  Unchanged Chronicity:  New Relieved by:  None tried Worsened by:  Nothing Ineffective treatments:  None tried Associated symptoms: no swollen glands and no wheezing     Past Medical History:  Diagnosis Date  . Cataract    Left eye  . CML (chronic myelocytic leukemia) ([place])   . Leukocytosis, unspecified 10/17/2012  . Thrombocytosis ([place]) 10/17/2012  . White coat hypertension 06/06/2014    Patient Active Problem List   Diagnosis Date Noted  . White coat syndrome with hypertension 06/06/2014  . Spells 11/28/2013  . Leukopenia due to antineoplastic chemotherapy ([place]) 11/28/2013  . Pancytopenia due to antineoplastic chemotherapy ([place]) 08/09/2013  . 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 CATARACT EXTRACTION Left 01/01/2013  . EYE SURGERY  2012   Detached Retina  . HERNIA REPAIR    . JOINT REPLACEMENT  2006   Total Knee Left  . TONSILLECTOMY         Home Medications    Prior to Admission medications   Medication Sig Start Date End Date Taking? Authorizing Provider  ALFALFA PO Take by mouth daily.    Historical Provider, MD  ALOE VERA JUICE PO Take 30 mLs by mouth every morning.    Historical Provider, MD  cholecalciferol (VITAMIN D) 1000 UNITS tablet Take 2,000 Units by mouth daily.     Historical Provider, MD  Coenzyme Q10 (CO Q-10) 100 MG CAPS Take 100 mg by mouth daily.    Historical Provider, MD  DANDELION PO Take by mouth 2 (two) times daily. supplement    Historical Provider, MD    dasatinib (SPRYCEL) 50 MG tablet Take 1 tablet (50 mg total) by mouth daily. 01/15/16   Heath Lark, MD  fish oil-omega-3 fatty acids 1000 MG capsule Take 1 g by mouth daily.    Historical Provider, MD  GARLIC PO Take 1 capsule by mouth daily.    Historical Provider, MD  magnesium oxide (MAG-OX) 400 MG tablet Take 400 mg by mouth every morning.    Historical Provider, MD  [place]    Historical Provider, MD  saw palmetto 160 MG capsule Take 160 mg by mouth daily.    Historical Provider, MD  Taurine (MEGA TAURINE) 1000 MG CAPS Take 1,000 mg by mouth every morning.    Historical Provider, MD  TURMERIC CURCUMIN PO Take 1 capsule by mouth daily.    Historical Provider, MD  vitamin B-12 (CYANOCOBALAMIN) 1000 MCG tablet Take 1,000 mcg by mouth daily.    Historical Provider, MD    Family History Family History  Problem Relation Age of Onset  . Arthritis Mother   . Diabetes Father   . Hypertension Father   . Cancer Father     bladder ca  . Healthy Brother   . Healthy Brother     Social History Social History  Substance Use Topics  . Smoking status: Never Smoker  . Smokeless tobacco: Never Used  . Alcohol use 4.2  oz/week    7 Glasses of wine per week     Allergies   Corticosteroids   Review of Systems Review of Systems  Constitutional: Negative for fever.  HENT: Positive for congestion, postnasal drip and rhinorrhea. Negative for sore throat.   Respiratory: Positive for cough. Negative for wheezing.   Cardiovascular: Negative.   Gastrointestinal: Negative.   All other systems reviewed and are negative.    Physical Exam Triage Vital Signs ED Triage Vitals  Enc Vitals Group     BP      Pulse      Resp      Temp      Temp src      SpO2      Weight      Height      Head Circumference      Peak Flow      Pain Score      Pain Loc      Pain Edu?      Excl. in [place]?    No data found.   Updated Vital Signs There were no vitals taken for this  visit.  Visual Acuity Right Eye Distance:   Left Eye Distance:   Bilateral Distance:    Right Eye Near:   Left Eye Near:    Bilateral Near:     Physical Exam  Constitutional: He is oriented to person, place, and time. He appears well-developed and well-nourished. No distress.  HENT:  Right Ear: External ear normal.  Left Ear: External ear normal.  Nose: Mucosal edema and rhinorrhea present.  Mouth/Throat: Oropharynx is clear and moist.  Neck: Normal range of motion. Neck supple.  Cardiovascular: Normal rate, regular rhythm, normal heart sounds and intact distal pulses.   Pulmonary/Chest: Effort normal and breath sounds normal.  Lymphadenopathy:    He has no cervical adenopathy.  Neurological: He is alert and oriented to person, place, and time.  Skin: Skin is warm and dry.  Nursing note and vitals reviewed.    UC Treatments / Results  Labs (all labs ordered are listed, but only abnormal results are displayed) Labs Reviewed - No data to display  EKG  EKG Interpretation None       Radiology No results found.  Procedures Procedures (including critical care time)  Medications Ordered in UC Medications - No data to display   Initial Impression / Assessment and Plan / UC Course  I have reviewed the triage vital signs and the nursing notes.  Pertinent labs & imaging results that were available during my care of the patient were reviewed by me and considered in my medical decision making (see chart for details).  Clinical Course       Final Clinical Impressions(s) / UC Diagnoses   Final diagnoses:  None    New Prescriptions New Prescriptions   No medications on file     Billy Fischer, MD 01/23/16 1113

## 2016-01-23 NOTE — ED Triage Notes (Signed)
Pt  Reports     Symptoms  Of   Sinus  Drainage  Cough   X  1  Week         Nasal  stuffyness

## 2016-02-04 ENCOUNTER — Telehealth: Payer: Self-pay

## 2016-02-04 NOTE — Telephone Encounter (Signed)
Follow-up on Sprycel Prescription to [facility].  Sprycel 50 mg tablets   Per [facility] pharmacy medication was shipped on 01/26/16 and should be in patients possession.  Will follow-up as needed.  Thank you   Henreitta Leber, PharmD [facility]

## 2016-04-15 ENCOUNTER — Other Ambulatory Visit: Payer: BLUE CROSS/BLUE SHIELD

## 2016-04-15 ENCOUNTER — Telehealth: Payer: Self-pay | Admitting: *Deleted

## 2016-04-15 DIAGNOSIS — C9210 Chronic myeloid leukemia, BCR/ABL-positive, not having achieved remission: Secondary | ICD-10-CM

## 2016-04-15 LAB — CBC WITH DIFFERENTIAL/PLATELET
BASO%: 1.2 % (ref 0.0–2.0)
Basophils Absolute: 0 10*3/uL (ref 0.0–0.1)
EOS ABS: 0.1 10*3/uL (ref 0.0–0.5)
EOS%: 4.1 % (ref 0.0–7.0)
HCT: 41.5 % (ref 38.4–49.9)
HGB: 13.9 g/dL (ref 13.0–17.1)
LYMPH%: 36.4 % (ref 14.0–49.0)
MCH: 30.6 pg (ref 27.2–33.4)
MCHC: 33.4 g/dL (ref 32.0–36.0)
MCV: 91.8 fL (ref 79.3–98.0)
MONO#: 0.3 10*3/uL (ref 0.1–0.9)
MONO%: 11.7 % (ref 0.0–14.0)
NEUT%: 46.6 % (ref 39.0–75.0)
NEUTROS ABS: 1.1 10*3/uL — AB (ref 1.5–6.5)
PLATELETS: 151 10*3/uL (ref 140–400)
RBC: 4.52 10*6/uL (ref 4.20–5.82)
RDW: 13.6 % (ref 11.0–14.6)
WBC: 2.3 10*3/uL — ABNORMAL LOW (ref 4.0–10.3)
lymph#: 0.8 10*3/uL — ABNORMAL LOW (ref 0.9–3.3)

## 2016-04-15 LAB — COMPREHENSIVE METABOLIC PANEL
ALT: 18 U/L (ref 0–55)
ANION GAP: 7 meq/L (ref 3–11)
AST: 19 U/L (ref 5–34)
Albumin: 3.9 g/dL (ref 3.5–5.0)
Alkaline Phosphatase: 60 U/L (ref 40–150)
BILIRUBIN TOTAL: 0.91 mg/dL (ref 0.20–1.20)
BUN: 20.1 mg/dL (ref 7.0–26.0)
CO2: 25 meq/L (ref 22–29)
Calcium: 9.2 mg/dL (ref 8.4–10.4)
Chloride: 107 mEq/L (ref 98–109)
Creatinine: 1 mg/dL (ref 0.7–1.3)
EGFR: 74 mL/min/{1.73_m2} — AB (ref >90)
Glucose: 92 mg/dl (ref 70–140)
Potassium: 4.2 mEq/L (ref 3.5–5.1)
Sodium: 139 mEq/L (ref 136–145)
Total Protein: 7 g/dL (ref 6.4–8.3)

## 2016-04-15 NOTE — Telephone Encounter (Signed)
LM with message below. To come at 0830 for labs prior to seeing Dr Alvy Bimler on 2/15  To call if has questions.  Msg to scheduler

## 2016-04-15 NOTE — Telephone Encounter (Signed)
-----   Message from Heath Lark, MD sent at 04/15/2016 10:01 AM EST ----- Regarding: low ANC Please call patient to hold his Sprycel. ANC is low, add labs next week for recheck before he sees me

## 2016-04-22 ENCOUNTER — Other Ambulatory Visit (HOSPITAL_BASED_OUTPATIENT_CLINIC_OR_DEPARTMENT_OTHER): Payer: BLUE CROSS/BLUE SHIELD

## 2016-04-22 ENCOUNTER — Ambulatory Visit (HOSPITAL_BASED_OUTPATIENT_CLINIC_OR_DEPARTMENT_OTHER): Payer: BLUE CROSS/BLUE SHIELD | Admitting: Hematology and Oncology

## 2016-04-22 ENCOUNTER — Telehealth: Payer: Self-pay | Admitting: Hematology and Oncology

## 2016-04-22 ENCOUNTER — Other Ambulatory Visit: Payer: Self-pay | Admitting: Hematology and Oncology

## 2016-04-22 VITALS — BP 181/79 | HR 60 | Temp 97.6°F | Resp 18 | Ht 76.0 in | Wt 221.4 lb

## 2016-04-22 DIAGNOSIS — D701 Agranulocytosis secondary to cancer chemotherapy: Secondary | ICD-10-CM

## 2016-04-22 DIAGNOSIS — I10 Essential (primary) hypertension: Secondary | ICD-10-CM | POA: Diagnosis not present

## 2016-04-22 DIAGNOSIS — C9210 Chronic myeloid leukemia, BCR/ABL-positive, not having achieved remission: Secondary | ICD-10-CM

## 2016-04-22 DIAGNOSIS — T451X5A Adverse effect of antineoplastic and immunosuppressive drugs, initial encounter: Secondary | ICD-10-CM

## 2016-04-22 LAB — CBC WITH DIFFERENTIAL/PLATELET
BASO%: 1.4 % (ref 0.0–2.0)
Basophils Absolute: 0 10*3/uL (ref 0.0–0.1)
EOS ABS: 0.1 10*3/uL (ref 0.0–0.5)
EOS%: 3.6 % (ref 0.0–7.0)
HEMATOCRIT: 41.3 % (ref 38.4–49.9)
HEMOGLOBIN: 14.1 g/dL (ref 13.0–17.1)
LYMPH#: 1.1 10*3/uL (ref 0.9–3.3)
LYMPH%: 35.6 % (ref 14.0–49.0)
MCH: 31.1 pg (ref 27.2–33.4)
MCHC: 34.1 g/dL (ref 32.0–36.0)
MCV: 91.2 fL (ref 79.3–98.0)
MONO#: 0.4 10*3/uL (ref 0.1–0.9)
MONO%: 14.4 % — ABNORMAL HIGH (ref 0.0–14.0)
NEUT%: 45 % (ref 39.0–75.0)
NEUTROS ABS: 1.4 10*3/uL — AB (ref 1.5–6.5)
PLATELETS: 141 10*3/uL (ref 140–400)
RBC: 4.53 10*6/uL (ref 4.20–5.82)
RDW: 13.6 % (ref 11.0–14.6)
WBC: 3.1 10*3/uL — AB (ref 4.0–10.3)

## 2016-04-22 NOTE — Telephone Encounter (Signed)
Appointments scheduled per 04/22/16 los. Patient was given a copy of the AVS report and appointment schedule per 04/22/16 los.

## 2016-04-23 ENCOUNTER — Encounter: Payer: Self-pay | Admitting: Hematology and Oncology

## 2016-04-23 NOTE — Assessment & Plan Note (Signed)
This is likely due to recent treatment. The patient denies recent history of fevers, cough, chills, diarrhea or dysuria. He is asymptomatic from the leukopenia. I will observe for now.  

## 2016-04-23 NOTE — Assessment & Plan Note (Signed)
His blood pressure is elevated but the patient is not symptomatic. 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 We will observe only.    

## 2016-04-23 NOTE — Progress Notes (Signed)
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      11/07/2012 - 08/09/2013 Chemotherapy    He was started on Gleevec 400 mg daily.      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      08/22/2013 -  Chemotherapy    He is started on dasatinib.      11/19/2013 Tumor Marker    BCR/ABL is improving      02/22/2014 Pathology Results    BCR/ABL continues to improve and the patient is moving towards molecular remission      05/27/2014 Pathology Results    BCR/ABL b2a2 & b3a2 both are 0.05%, b2a2 & b3a2 IS both are 0.028%. Patient in MMR      09/26/2014 Pathology Results    Repeat BCR/ABL was not detectable      02/28/2015 Tumor Marker    Repeat BCR/ABL was not detectable      08/28/2015 Tumor Marker    Repeat BCR/ABL was not detectable      01/06/2016 Pathology Results    Repeat BCR/ABL was not detectable      04/15/2016 Pathology Results    Repeat BCR/ABL was not detectable       INTERVAL HISTORY: Please see below for problem oriented charting. He returns for follow-up We held his treatment recently due to neutropenia He may resume now He denies recent infection No recent dyspnea, cough or leg edema  REVIEW OF SYSTEMS:   Constitutional: Denies fevers, chills or abnormal weight loss Eyes: Denies blurriness of vision Ears, nose, mouth, throat, and face: Denies mucositis or sore throat Respiratory:  Denies cough, dyspnea or wheezes Cardiovascular: Denies palpitation, chest discomfort or lower extremity swelling Gastrointestinal:  Denies nausea, heartburn or change in bowel habits Skin: Denies abnormal skin rashes Lymphatics: Denies new lymphadenopathy or easy bruising Neurological:Denies numbness, tingling or new weaknesses Behavioral/Psych: Mood is stable, no new changes  All other systems were reviewed with the patient and are negative.  I have reviewed the past medical history, past surgical history, social history and family history with the patient and they are unchanged from previous note.  ALLERGIES:  is allergic to corticosteroids.  MEDICATIONS:  Current Outpatient Prescriptions  Medication Sig Dispense Refill  . ALFALFA PO Take by mouth daily.    . ALOE VERA JUICE PO Take 30 mLs by mouth every morning.    . cholecalciferol (VITAMIN D) 1000 UNITS tablet Take 2,000 Units by mouth daily.     . Coenzyme Q10 (CO Q-10) 100 MG CAPS Take 100 mg by mouth daily.    Marland Kitchen DANDELION PO Take by mouth 2 (two) times daily. supplement    . dasatinib (SPRYCEL) 50 MG tablet Take 1 tablet (50 mg total) by mouth daily. 90 tablet 11  . fish oil-omega-3 fatty acids 1000 MG capsule Take 1 g by mouth daily.    Marland Kitchen GARLIC PO Take 1  capsule by mouth daily.    . magnesium oxide (MAG-OX) 400 MG tablet Take 400 mg by mouth every morning.    Marland Kitchen OVER THE COUNTER MEDICATION ASEA    . saw palmetto 160 MG capsule Take 160 mg by mouth daily.    . Taurine (MEGA TAURINE) 1000 MG CAPS Take 1,000 mg by mouth every morning.    . TURMERIC CURCUMIN PO Take 1 capsule by mouth daily.    . vitamin B-12 (CYANOCOBALAMIN) 1000 MCG tablet Take 1,000 mcg by mouth daily.     No current facility-administered medications for this visit.     PHYSICAL EXAMINATION: ECOG PERFORMANCE STATUS: 0 - Asymptomatic  Vitals:   04/22/16 0909  BP: (!) 181/79  Pulse: 60  Resp: 18  Temp: 97.6 F (36.4 C)   Filed Weights   04/22/16  0909  Weight: 221 lb 6.4 oz (100.4 kg)    GENERAL:alert, no distress and comfortable SKIN: skin color, texture, turgor are normal, no rashes or significant lesions EYES: normal, Conjunctiva are pink and non-injected, sclera clear OROPHARYNX:no exudate, no erythema and lips, buccal mucosa, and tongue normal  NECK: supple, thyroid normal size, non-tender, without nodularity LYMPH:  no palpable lymphadenopathy in the cervical, axillary or inguinal LUNGS: clear to auscultation and percussion with normal breathing effort HEART: regular rate & rhythm and no murmurs and no lower extremity edema ABDOMEN:abdomen soft, non-tender and normal bowel sounds Musculoskeletal:no cyanosis of digits and no clubbing  NEURO: alert & oriented x 3 with fluent speech, no focal motor/sensory deficits  LABORATORY DATA:  I have reviewed the data as listed    Component Value Date/Time   NA 139 04/15/2016 0905   K 4.2 04/15/2016 0905   CL 105 10/16/2013 0826   CO2 25 04/15/2016 0905   GLUCOSE 92 04/15/2016 0905   BUN 20.1 04/15/2016 0905   CREATININE 1.0 04/15/2016 0905   CALCIUM 9.2 04/15/2016 0905   PROT 7.0 04/15/2016 0905   ALBUMIN 3.9 04/15/2016 0905   AST 19 04/15/2016 0905   ALT 18 04/15/2016 0905   ALKPHOS 60 04/15/2016 0905   BILITOT 0.91 04/15/2016 0905    No results found for: SPEP, UPEP  Lab Results  Component Value Date   WBC 3.1 (L) 04/22/2016   NEUTROABS 1.4 (L) 04/22/2016   HGB 14.1 04/22/2016   HCT 41.3 04/22/2016   MCV 91.2 04/22/2016   PLT 141 04/22/2016      Chemistry      Component Value Date/Time   NA 139 04/15/2016 0905   K 4.2 04/15/2016 0905   CL 105 10/16/2013 0826   CO2 25 04/15/2016 0905   BUN 20.1 04/15/2016 0905   CREATININE 1.0 04/15/2016 0905      Component Value Date/Time   CALCIUM 9.2 04/15/2016 0905   ALKPHOS 60 04/15/2016 0905   AST 19 04/15/2016 0905   ALT 18 04/15/2016 0905   BILITOT 0.91 04/15/2016 0905       RADIOGRAPHIC STUDIES: I have  personally reviewed the radiological images as listed and agreed with the findings in the report. No results found.  ASSESSMENT & PLAN:  Chronic myelogenous leukemia His recent blood work suggests that he is still in major molecular response with undetectable BCR/ABL Due to mild leukopenia, we agreed to repeat CBC again next month We discussed current guidelines and recommendation regarding discontinuation of treatment. 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 The patient denies recent history of fevers, cough, chills, diarrhea or dysuria. He is asymptomatic from the leukopenia. I will observe for now.    White coat syndrome with hypertension His blood pressure is elevated but the patient is not symptomatic. 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 We will observe only.    Orders Placed This Encounter  Procedures  . Comprehensive metabolic panel    Standing Status:   Future    Standing Expiration Date:   05/27/2017  . BCR-ABL    With RT-PCR technique    Standing Status:   Future    Standing Expiration Date:   05/27/2017   All questions were answered. The patient knows to call the clinic with any problems, questions or concerns. No barriers to learning was detected. I spent 15 minutes counseling the patient face to face. The total time spent in the appointment was 20 minutes and more than 50% was on counseling and review of test results     Heath Lark, MD 04/23/2016 2:30 AM

## 2016-04-23 NOTE — Assessment & Plan Note (Signed)
His recent blood work suggests that he is still in major molecular response with undetectable BCR/ABL Due to mild leukopenia, we agreed to repeat CBC again next month We discussed current guidelines and recommendation regarding discontinuation of treatment. At this point in time, he does not meet the criteria for discontinuation I will see him back in 3 months with repeat blood work history and examination

## 2016-05-17 ENCOUNTER — Telehealth: Payer: Self-pay | Admitting: Hematology and Oncology

## 2016-05-17 ENCOUNTER — Telehealth: Payer: Self-pay

## 2016-05-17 ENCOUNTER — Other Ambulatory Visit (HOSPITAL_BASED_OUTPATIENT_CLINIC_OR_DEPARTMENT_OTHER): Payer: BLUE CROSS/BLUE SHIELD

## 2016-05-17 DIAGNOSIS — C9210 Chronic myeloid leukemia, BCR/ABL-positive, not having achieved remission: Secondary | ICD-10-CM

## 2016-05-17 LAB — CBC WITH DIFFERENTIAL/PLATELET
BASO%: 0.9 % (ref 0.0–2.0)
Basophils Absolute: 0 10*3/uL (ref 0.0–0.1)
EOS%: 2.5 % (ref 0.0–7.0)
Eosinophils Absolute: 0.1 10*3/uL (ref 0.0–0.5)
HCT: 39.6 % (ref 38.4–49.9)
HEMOGLOBIN: 13.4 g/dL (ref 13.0–17.1)
LYMPH%: 25 % (ref 14.0–49.0)
MCH: 31.2 pg (ref 27.2–33.4)
MCHC: 33.9 g/dL (ref 32.0–36.0)
MCV: 92.2 fL (ref 79.3–98.0)
MONO#: 0.5 10*3/uL (ref 0.1–0.9)
MONO%: 12.2 % (ref 0.0–14.0)
NEUT%: 59.4 % (ref 39.0–75.0)
NEUTROS ABS: 2.2 10*3/uL (ref 1.5–6.5)
Platelets: 152 10*3/uL (ref 140–400)
RBC: 4.3 10*6/uL (ref 4.20–5.82)
RDW: 14.3 % (ref 11.0–14.6)
WBC: 3.7 10*3/uL — AB (ref 4.0–10.3)
lymph#: 0.9 10*3/uL (ref 0.9–3.3)

## 2016-05-17 NOTE — Telephone Encounter (Signed)
-----   Message from Heath Lark, MD sent at 05/17/2016  9:20 AM EDT ----- Regarding: CBC CBC is better Please let him know Continue Sprycel ----- Message ----- From: Interface, Lab In Three Zero One Sent: 05/17/2016   9:18 AM To: Heath Lark, MD

## 2016-05-17 NOTE — Telephone Encounter (Signed)
Called with below message, verbalized understanding. 

## 2016-05-17 NOTE — Telephone Encounter (Signed)
Appointments rescheduled per patient request. Patient came to scheduling to reschedule May follow up appointment per availability. Patient given AVS report and new calendars with future scheduled appointments.

## 2016-05-25 ENCOUNTER — Emergency Department (HOSPITAL_COMMUNITY): Payer: BLUE CROSS/BLUE SHIELD

## 2016-05-25 ENCOUNTER — Other Ambulatory Visit: Payer: Self-pay

## 2016-05-25 ENCOUNTER — Encounter (HOSPITAL_COMMUNITY): Payer: Self-pay | Admitting: Cardiology

## 2016-05-25 ENCOUNTER — Inpatient Hospital Stay (HOSPITAL_COMMUNITY)
Admission: EM | Admit: 2016-05-25 | Discharge: 2016-05-27 | DRG: 292 | Disposition: A | Discharge status: Home / Self Care | Payer: BLUE CROSS/BLUE SHIELD | Attending: Cardiovascular Disease | Admitting: Cardiovascular Disease

## 2016-05-25 ENCOUNTER — Encounter (HOSPITAL_COMMUNITY): Payer: Self-pay

## 2016-05-25 ENCOUNTER — Ambulatory Visit (HOSPITAL_COMMUNITY)
Admission: RE | Admit: 2016-05-25 | Discharge: 2016-05-25 | Disposition: A | Discharge status: Home / Self Care | Payer: BLUE CROSS/BLUE SHIELD | Source: Ambulatory Visit | Attending: Hematology and Oncology | Admitting: Hematology and Oncology

## 2016-05-25 ENCOUNTER — Encounter: Payer: Self-pay | Admitting: Hematology and Oncology

## 2016-05-25 ENCOUNTER — Ambulatory Visit (HOSPITAL_BASED_OUTPATIENT_CLINIC_OR_DEPARTMENT_OTHER): Payer: BLUE CROSS/BLUE SHIELD

## 2016-05-25 ENCOUNTER — Telehealth: Payer: Self-pay | Admitting: *Deleted

## 2016-05-25 ENCOUNTER — Ambulatory Visit (HOSPITAL_BASED_OUTPATIENT_CLINIC_OR_DEPARTMENT_OTHER): Payer: BLUE CROSS/BLUE SHIELD | Admitting: Hematology and Oncology

## 2016-05-25 ENCOUNTER — Other Ambulatory Visit: Payer: Self-pay | Admitting: Hematology and Oncology

## 2016-05-25 DIAGNOSIS — I10 Essential (primary) hypertension: Secondary | ICD-10-CM

## 2016-05-25 DIAGNOSIS — I5041 Acute combined systolic (congestive) and diastolic (congestive) heart failure: Secondary | ICD-10-CM | POA: Diagnosis not present

## 2016-05-25 DIAGNOSIS — Z8261 Family history of arthritis: Secondary | ICD-10-CM

## 2016-05-25 DIAGNOSIS — I5021 Acute systolic (congestive) heart failure: Secondary | ICD-10-CM

## 2016-05-25 DIAGNOSIS — C9210 Chronic myeloid leukemia, BCR/ABL-positive, not having achieved remission: Secondary | ICD-10-CM

## 2016-05-25 DIAGNOSIS — Z841 Family history of disorders of kidney and ureter: Secondary | ICD-10-CM

## 2016-05-25 DIAGNOSIS — Z8052 Family history of malignant neoplasm of bladder: Secondary | ICD-10-CM

## 2016-05-25 DIAGNOSIS — R0989 Other specified symptoms and signs involving the circulatory and respiratory systems: Secondary | ICD-10-CM | POA: Diagnosis present

## 2016-05-25 DIAGNOSIS — I110 Hypertensive heart disease with heart failure: Principal | ICD-10-CM | POA: Diagnosis present

## 2016-05-25 DIAGNOSIS — I509 Heart failure, unspecified: Secondary | ICD-10-CM

## 2016-05-25 DIAGNOSIS — C9211 Chronic myeloid leukemia, BCR/ABL-positive, in remission: Secondary | ICD-10-CM

## 2016-05-25 DIAGNOSIS — Z96652 Presence of left artificial knee joint: Secondary | ICD-10-CM | POA: Diagnosis present

## 2016-05-25 DIAGNOSIS — I272 Pulmonary hypertension, unspecified: Secondary | ICD-10-CM | POA: Diagnosis present

## 2016-05-25 DIAGNOSIS — I5031 Acute diastolic (congestive) heart failure: Secondary | ICD-10-CM

## 2016-05-25 DIAGNOSIS — R079 Chest pain, unspecified: Secondary | ICD-10-CM | POA: Diagnosis not present

## 2016-05-25 DIAGNOSIS — I4891 Unspecified atrial fibrillation: Secondary | ICD-10-CM | POA: Insufficient documentation

## 2016-05-25 DIAGNOSIS — R002 Palpitations: Secondary | ICD-10-CM | POA: Diagnosis present

## 2016-05-25 DIAGNOSIS — Z8249 Family history of ischemic heart disease and other diseases of the circulatory system: Secondary | ICD-10-CM

## 2016-05-25 DIAGNOSIS — Z833 Family history of diabetes mellitus: Secondary | ICD-10-CM

## 2016-05-25 DIAGNOSIS — N400 Enlarged prostate without lower urinary tract symptoms: Secondary | ICD-10-CM | POA: Diagnosis present

## 2016-05-25 DIAGNOSIS — Z9842 Cataract extraction status, left eye: Secondary | ICD-10-CM

## 2016-05-25 DIAGNOSIS — Z9221 Personal history of antineoplastic chemotherapy: Secondary | ICD-10-CM

## 2016-05-25 DIAGNOSIS — I4510 Unspecified right bundle-branch block: Secondary | ICD-10-CM | POA: Diagnosis present

## 2016-05-25 DIAGNOSIS — I428 Other cardiomyopathies: Secondary | ICD-10-CM | POA: Diagnosis not present

## 2016-05-25 DIAGNOSIS — Z9889 Other specified postprocedural states: Secondary | ICD-10-CM

## 2016-05-25 DIAGNOSIS — Z888 Allergy status to other drugs, medicaments and biological substances status: Secondary | ICD-10-CM

## 2016-05-25 DIAGNOSIS — Z79899 Other long term (current) drug therapy: Secondary | ICD-10-CM

## 2016-05-25 LAB — COMPREHENSIVE METABOLIC PANEL
ALBUMIN: 3.6 g/dL (ref 3.5–5.0)
ALK PHOS: 79 U/L (ref 40–150)
ALT: 49 U/L (ref 0–55)
AST: 32 U/L (ref 5–34)
Anion Gap: 9 mEq/L (ref 3–11)
BILIRUBIN TOTAL: 0.8 mg/dL (ref 0.20–1.20)
BUN: 15.4 mg/dL (ref 7.0–26.0)
CO2: 22 mEq/L (ref 22–29)
Calcium: 9 mg/dL (ref 8.4–10.4)
Chloride: 109 mEq/L (ref 98–109)
Creatinine: 1 mg/dL (ref 0.7–1.3)
EGFR: 75 mL/min/{1.73_m2} — ABNORMAL LOW (ref >90)
GLUCOSE: 101 mg/dL (ref 70–140)
Potassium: 4.3 mEq/L (ref 3.5–5.1)
SODIUM: 140 meq/L (ref 136–145)
TOTAL PROTEIN: 6.6 g/dL (ref 6.4–8.3)

## 2016-05-25 LAB — BASIC METABOLIC PANEL
Anion gap: 11 (ref 5–15)
BUN: 14 mg/dL (ref 6–20)
CO2: 22 mmol/L (ref 22–32)
Calcium: 9.1 mg/dL (ref 8.9–10.3)
Chloride: 106 mmol/L (ref 101–111)
Creatinine, Ser: 1.11 mg/dL (ref 0.61–1.24)
GFR calc Af Amer: >60 mL/min (ref >60)
GLUCOSE: 99 mg/dL (ref 65–99)
POTASSIUM: 4.5 mmol/L (ref 3.5–5.1)
Sodium: 139 mmol/L (ref 135–145)

## 2016-05-25 LAB — CBC WITH DIFFERENTIAL/PLATELET
BASO%: 0.6 % (ref 0.0–2.0)
Basophils Absolute: 0 10*3/uL (ref 0.0–0.1)
EOS ABS: 0.1 10*3/uL (ref 0.0–0.5)
EOS%: 2 % (ref 0.0–7.0)
HEMATOCRIT: 39.8 % (ref 38.4–49.9)
HEMOGLOBIN: 13.5 g/dL (ref 13.0–17.1)
LYMPH#: 0.8 10*3/uL — AB (ref 0.9–3.3)
LYMPH%: 15.5 % (ref 14.0–49.0)
MCH: 30.8 pg (ref 27.2–33.4)
MCHC: 33.9 g/dL (ref 32.0–36.0)
MCV: 90.7 fL (ref 79.3–98.0)
MONO#: 0.6 10*3/uL (ref 0.1–0.9)
MONO%: 12.1 % (ref 0.0–14.0)
NEUT%: 69.8 % (ref 39.0–75.0)
NEUTROS ABS: 3.4 10*3/uL (ref 1.5–6.5)
Platelets: 147 10*3/uL (ref 140–400)
RBC: 4.39 10*6/uL (ref 4.20–5.82)
RDW: 14.3 % (ref 11.0–14.6)
WBC: 4.9 10*3/uL (ref 4.0–10.3)

## 2016-05-25 LAB — CBC
HEMATOCRIT: 41 % (ref 39.0–52.0)
Hemoglobin: 13.7 g/dL (ref 13.0–17.0)
MCH: 30.5 pg (ref 26.0–34.0)
MCHC: 33.4 g/dL (ref 30.0–36.0)
MCV: 91.3 fL (ref 78.0–100.0)
Platelets: 148 10*3/uL — ABNORMAL LOW (ref 150–400)
RBC: 4.49 MIL/uL (ref 4.22–5.81)
RDW: 14.2 % (ref 11.5–15.5)
WBC: 4.7 10*3/uL (ref 4.0–10.5)

## 2016-05-25 LAB — HEPARIN LEVEL (UNFRACTIONATED): Heparin Unfractionated: 0.23 IU/mL — ABNORMAL LOW (ref 0.30–0.70)

## 2016-05-25 LAB — MAGNESIUM: MAGNESIUM: 2.1 mg/dL (ref 1.7–2.4)

## 2016-05-25 LAB — PROTIME-INR
INR: 1.21
PROTHROMBIN TIME: 15.4 s — AB (ref 11.4–15.2)

## 2016-05-25 LAB — BRAIN NATRIURETIC PEPTIDE: B Natriuretic Peptide: 348 pg/mL — ABNORMAL HIGH (ref 0.0–100.0)

## 2016-05-25 LAB — TSH: TSH: 2.131 u[IU]/mL (ref 0.350–4.500)

## 2016-05-25 MED ORDER — FUROSEMIDE 10 MG/ML IJ SOLN
40.0000 mg | Freq: Once | INTRAMUSCULAR | Status: AC
  Administered 2016-05-25: 40 mg via INTRAVENOUS
  Filled 2016-05-25: qty 4

## 2016-05-25 MED ORDER — ONDANSETRON HCL 4 MG/2ML IJ SOLN: 4.0000 mg | Freq: Four times a day (QID) | INTRAMUSCULAR | Status: DC | PRN

## 2016-05-25 MED ORDER — DILTIAZEM LOAD VIA INFUSION
10.0000 mg | Freq: Once | INTRAVENOUS | Status: AC
  Administered 2016-05-25: 10 mg via INTRAVENOUS
  Filled 2016-05-25: qty 10

## 2016-05-25 MED ORDER — DILTIAZEM HCL-DEXTROSE 100-5 MG/100ML-% IV SOLN (PREMIX)
5.0000 mg/h | Freq: Once | INTRAVENOUS | Status: AC
  Administered 2016-05-25: 5 mg/h via INTRAVENOUS
  Filled 2016-05-25 (×2): qty 100

## 2016-05-25 MED ORDER — ACETAMINOPHEN 325 MG PO TABS
650.0000 mg | ORAL_TABLET | ORAL | Status: DC | PRN
  Administered 2016-05-27: 650 mg via ORAL
  Filled 2016-05-25: qty 2

## 2016-05-25 MED ORDER — DASATINIB 50 MG PO TABS: 50.0000 mg | ORAL_TABLET | Freq: Every day | ORAL | Status: DC

## 2016-05-25 MED ORDER — OFF THE BEAT BOOK
Freq: Once | Status: AC
  Administered 2016-05-25: 1
  Filled 2016-05-25: qty 1

## 2016-05-25 MED ORDER — DILTIAZEM HCL-DEXTROSE 100-5 MG/100ML-% IV SOLN (PREMIX)
5.0000 mg/h | INTRAVENOUS | Status: DC
  Administered 2016-05-25 – 2016-05-26 (×3): 15 mg/h via INTRAVENOUS
  Filled 2016-05-25 (×3): qty 100

## 2016-05-25 MED ORDER — HEPARIN BOLUS VIA INFUSION
4000.0000 [IU] | Freq: Once | INTRAVENOUS | Status: AC
  Administered 2016-05-25: 4000 [IU] via INTRAVENOUS
  Filled 2016-05-25: qty 4000

## 2016-05-25 MED ORDER — DILTIAZEM HCL 25 MG/5ML IV SOLN
10.0000 mg | Freq: Once | INTRAVENOUS | Status: DC
  Filled 2016-05-25: qty 5

## 2016-05-25 MED ORDER — HEPARIN (PORCINE) IN NACL 100-0.45 UNIT/ML-% IJ SOLN
1650.0000 [IU]/h | INTRAMUSCULAR | Status: DC
  Administered 2016-05-25: 1400 [IU]/h via INTRAVENOUS
  Administered 2016-05-26: 1650 [IU]/h via INTRAVENOUS
  Filled 2016-05-25 (×3): qty 250

## 2016-05-25 NOTE — H&P (Addendum)
History & Physical    Patient ID: Larry Vasquez MRN: [id number], DOB/AGE: [birth year]   Admit date: 05/25/2016   Primary Physician: Woody Seller, MD Primary Cardiologist: New Dr. Angelena Form   History of Present Illness    Larry Vasquez is a 66 y.o. 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 He was sent to the ED by his oncologist for rapid heart beat. In the ED he was found to be in atrial fibrillation with rapid ventricular response.  He has had occasional mild shortness of breath over the last 3-4 weeks and felt an occasional racing heartbeat over the last week. He's had increased shortness of breath over the last 2 days and has not slept well over the last 2 nights due to orthopnea. He has no chest pain or pressure. He has no lower extremity edema but feels like he has swelling around his midsection. He has no history of MI or cardiac events. He has white coat hypertension for which she is untreated and reportedly his blood pressures are normal at home. He has no known hyperlipidemia or diabetes. He has never smoked. He drinks 2-3 glasses of wine or a glass or 2 of liquor daily.  He takes a lot of supplements. Nothing new has been added in the last few weeks.  Diltiazem 10 mg IV bolus has been given followed by diltiazem drip which is being titrated. He remains in atrial fibrillation with rates between 80 and 120 bpm. He's been given 40 mg of IV Lasix and is having good urine output.   EKG Atrial fibrillation with rapid ventricular response rate of 151 bpm, Rightward axis, Incomplete right bundle branch block CXR: Overall appearance is most consistent with congestive heart failure. Some or perhaps even all of the interstitial prominence could be due to medication type response/allergic type phenomenon. Lymphangitic spread of tumor could present in this manner but is felt to be less likely. No  airspace consolidation. There is aortic atherosclerosis. No demonstrable adenopathy. SCr 1.11, K+ 4.5, Mag 2.1, TSH 2.131  Last wt in office 214 on 10/22/15, today is 223  Past Medical History    Past Medical History:  Diagnosis Date  . Cataract    Left eye  . CML (chronic myelocytic leukemia) ([place])   . Leukocytosis, unspecified 10/17/2012  . Thrombocytosis ([place]) 10/17/2012  . White coat hypertension 06/06/2014    Past Surgical History:  Procedure Laterality Date  . CATARACT EXTRACTION Left 01/01/2013  . EYE SURGERY  2012   Detached Retina  . HERNIA REPAIR    . JOINT REPLACEMENT  2006   Total Knee Left  . TONSILLECTOMY       Allergies  Allergies  Allergen Reactions  . Corticosteroids Other (See Comments)    Muscle stiffness     Home Medications    Prior to Admission medications   Medication Sig Start Date End Date Taking? Authorizing Provider  ALOE VERA JUICE PO Take 30 mLs by mouth every morning.    Historical Provider, MD  cholecalciferol (VITAMIN D) 1000 UNITS tablet Take 2,000 Units by mouth daily.     Historical Provider, MD  Coenzyme Q10 (CO Q-10) 100 MG CAPS Take 100 mg by mouth daily.    Historical Provider, MD  DANDELION PO Take by mouth 2 (two) times daily. supplement    Historical Provider, MD  dasatinib (SPRYCEL) 50 MG tablet Take 1 tablet (50 mg total) by  mouth daily. 01/15/16   Heath Lark, MD  Digestive Enzymes CAPS Take by mouth.    Historical Provider, MD  fish oil-omega-3 fatty acids 1000 MG capsule Take 1 g by mouth daily.    Historical Provider, MD  GARLIC PO Take 1 capsule by mouth daily.    Historical Provider, MD  L-Arginine 500 MG CAPS Take by mouth.    Historical Provider, MD  magnesium oxide (MAG-OX) 400 MG tablet Take 400 mg by mouth every morning.    Historical Provider, MD  [place]    Historical Provider, MD  saw palmetto 160 MG capsule Take 160 mg by mouth daily.    Historical Provider, MD  Taurine (MEGA TAURINE)  1000 MG CAPS Take 1,000 mg by mouth every morning.    Historical Provider, MD  TURMERIC CURCUMIN PO Take 1 capsule by mouth daily.    Historical Provider, MD  vitamin B-12 (CYANOCOBALAMIN) 1000 MCG tablet Take 1,000 mcg by mouth daily.    Historical Provider, MD    Family History    Family History  Problem Relation Age of Onset  . Arthritis Mother   . Hypertension Mother   . Diabetes Father   . Hypertension Father   . Cancer Father     bladder ca  . CAD Father     CABG  . Chronic Renal Failure Father   . Atrial fibrillation Father   . Healthy Brother   . Healthy Brother     Social History    Social History   Social History  . Marital status: Married    Spouse name: [name]  . Number of children: [name]  . Years of education: [name]   Occupational History  . Not on file.   Social History Main Topics  . Smoking status: Never Smoker  . Smokeless tobacco: Never Used  . Alcohol use 4.2 oz/week    7 Glasses of wine per week  . Drug use: No  . Sexual activity: Yes    Birth control/ protection: None   Other Topics Concern  . Not on file   Social History Narrative  . No narrative on file     Review of Systems   General:  No chills, fever, night sweats or weight changes.  Cardiovascular:  No chest pain or edema, Positive for shortness of breath, orthopnea, palpitations, No paroxysmal nocturnal dyspnea. Dermatological: No rash, lesions/masses Respiratory: No cough, positive for dyspnea on exertion Urologic: No hematuria, dysuria Abdominal:   No nausea, vomiting, diarrhea, bright red blood per rectum, melena, or hematemesis Neurologic:  No visual changes, wkns, changes in mental status. All other systems reviewed and are otherwise negative except as noted above.  Physical Exam    Blood pressure (!) 141/80, pulse 66, temperature 98 F (36.7 C), temperature source Oral, resp. rate 15, height 6\' 2"  (1.88 m), weight 223 lb (101.2 kg), SpO2 95 %.  General: Well developed, well  nourished,male in no acute distress. Head: Normocephalic, atraumatic, sclera non-icteric, no xanthomas,  Neck: No carotid bruits. JVD not elevated.  Lungs: Respirations regular and unlabored, without wheezes or rales.  Heart: Irregularly irregular rhythm, tachycardic. No S3 or S4.  No murmur, no rubs, or gallops appreciated. Abdomen: Soft, non-tender, non-distended with normoactive bowel sounds. No hepatomegaly. No rebound/guarding. No obvious abdominal masses. Msk:  Strength and tone appear normal for age. No joint deformities or effusions. Extremities: No clubbing or cyanosis. No edema.  Distal pedal pulses are 2+ bilaterally. Neuro: Alert and oriented  X 3. Moves all extremities spontaneously. No focal deficits noted. Psych:  Responds to questions appropriately with a normal affect. Skin: No rashes or lesions noted  Labs    Troponin (Point of Care Test) No results for input(s): TROPIPOC in the last 72 hours. No results for input(s): CKTOTAL, CKMB, TROPONINI in the last 72 hours. Lab Results  Component Value Date   WBC 4.7 05/25/2016   HGB 13.7 05/25/2016   HCT 41.0 05/25/2016   MCV 91.3 05/25/2016   PLT 148 (L) 05/25/2016     Recent Labs Lab 05/25/16 1000 05/25/16 1233  NA 140 139  K 4.3 4.5  CL  --  106  CO2 22 22  BUN 15.4 14  CREATININE 1.0 1.11  CALCIUM 9.0 9.1  PROT 6.6  --   BILITOT 0.80  --   ALKPHOS 79  --   ALT 49  --   AST 32  --   GLUCOSE 101 99   No results found for: CHOL, HDL, LDLCALC, TRIG No results found for: DDIMER   B Natriuretic Peptide  Date/Time Value Ref Range Status  05/25/2016 12:33 PM 348.0 (H) 0.0 - 100.0 pg/mL Final   No results found for: PROBNP No results for input(s): INR in the last 72 hours.    Radiology Studies    Dg Chest 2 View  Result Date: 05/25/2016 CLINICAL DATA:  Shortness of breath off and on for 3 weeks worse in last 2 days, history atrial fibrillation, CML EXAM: CHEST  2 VIEW COMPARISON:  Exam at 1259 hours  compared to 05/25/2016 at 0915 hours FINDINGS: Enlargement of cardiac silhouette with pulmonary vascular congestion. Atherosclerotic calcification aorta. Diffuse interstitial infiltrates question pulmonary edema and CHF. Bibasilar atelectasis and small pleural effusions. No pneumothorax. Bones demineralized. IMPRESSION: Probable CHF, unchanged from earlier exam of same date. Electronically Signed   By: Lavonia Dana M.D.   On: 05/25/2016 13:08   Dg Chest 2 View  Result Date: 05/25/2016 CLINICAL DATA:  Chronic myelogenous leukemia. Two week history of shortness of breath. EXAM: CHEST  2 VIEW COMPARISON:  October 18, 2012 chest CT FINDINGS: There is generalized interstitial prominence, likely edema. There is a minimal left pleural effusion. There is no airspace consolidation. There is cardiomegaly with pulmonary venous hypertension. There is aortic atherosclerotic calcification. No adenopathy is appreciable. There is degenerative change in the thoracic spine. No blastic or lytic bone lesions. IMPRESSION: Overall appearance is most consistent with congestive heart failure. Some or perhaps even all of the interstitial prominence could be due to medication type response/allergic type phenomenon. Lymphangitic spread of tumor could present in this manner but is felt to be less likely. No airspace consolidation. There is aortic atherosclerosis. No demonstrable adenopathy. Electronically Signed   By: Lowella Grip III M.D.   On: 05/25/2016 09:23    EKG & Cardiac Imaging    EKG: Atrial fibrillation with rapid ventricular response rate of 151 bpm, Rightward axis, Incomplete right bundle branch block  ECHOCARDIOGRAM: ordered  Assessment & Plan    1. New onset atrial fibrillation with RVR -Unknown duration with intermittent symptoms over the past few weeks -TSH and mag levels are normal -CXR shows probable CHF His weight, 223 pounds, is above his last office of 214 pounds -Was given lasix 40 IV, with good  urine output, and diltiazem drip initiated after bolus of 10 mg for rate control.  -CHA2DS2-VASc Score is 2 (HTN, age). Initiate heparin for stroke risk reduction. Once echocardiogram done and without significant  abnormalities and no need for invasive procedures, will switch to the DOAC. Patient will need to stop turmeric. -Will check BNP and echocardiogram -Continue with rate control with IV Cardizem and hopefully we'll be able to switch to oral medication tomorrow. We'll reassess fluid status tomorrow. Can consider cardioversion as an outpatient once he's been anticoagulated for 4 weeks if needed.  2. Hypertension -Patient reports only white coat hypertension that has been untreated in the past. Blood pressures are high currently. Patient is on diltiazem drip for atrial fib and has room and his blood pressure to increase the drip for heart rate control -Monitor blood pressures and may need to consider adding an antihypertensive  3. Acute CHF/Volume overload -BNP 348 -Likely related to Afib with RVR. Will check echo -Lasix 40 mg IV given with good urine output. -Will reassesses volume in am.   SignedDaune Perch, NP-C 05/25/2016, 3:35 PM Pager: [phone]  I have personally seen and examined this patient with Daune Perch, NP. I agree with the assessment and plan as outlined above. He has new onset atrial fibrillation with RVR. He feels palpitations. He has mild dyspnea. Some weight gain. No chest pain.  My exam shows a WDWN male in NAD, alert and oriented x 3. JJ:HERDE irreg with no murmurs. Lungs:clear bilaterally.  Abd: soft, NT, slight distention. Ext: no LE edema Labs reviewed by me. This shows normal H/H, lytes and renal function EKG reviewed by me shows atrial fibrillation with rate of 151 bpm. Incomplete RBBB Plan: I have provided extensive counseling/review of the etiology and treatment of atrial fibrillation with the patient and his wife Will admit to tele. Echo in am.  IV Heparin for anti-coagulation (until it is clear that no invasive procedure will be performed). IV Cardizem for rate control. Check BNP and TSH.  IV Lasix for mild volume overload. Further plans to follow.   Lauree Chandler 05/25/2016 4:24 PM

## 2016-05-25 NOTE — ED Provider Notes (Signed)
[facility] DEPT Provider Note   CSN: [id number] Arrival date & time: 05/25/16  1214     History   Chief Complaint Chief Complaint  Patient presents with  . Atrial Fibrillation    HPI Larry Vasquez is a 66 y.o. male.  HPI  Pt presenting rapid heart rate.  He was seen at his oncologist office this morning and found to be in rapid afib- referred to [facility].  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  Over the past 2 weeks he has noted his heart rate to be intermittently elevated.  He has felt more fatigued than usual.  Over the past 2 days he has felt more short of breath as well.  Last night he could not lie flat to sleep- he woke up with shortness of breath- describes PND.  He denies any lower extremity edema, feels his abdominal girth may have increased.  No fever/chills.  No cough.  No chest pain.  There are no other associated systemic symptoms, there are no other alleviating or modifying factors.   Past Medical History:  Diagnosis Date  . Cataract    Left eye  . CML (chronic myelocytic leukemia) ([place])   . Leukocytosis, unspecified 10/17/2012  . Thrombocytosis ([place]) 10/17/2012  . White coat hypertension 06/06/2014    Patient Active Problem List   Diagnosis Date Noted  . CHF (congestive heart failure) ([place]) 05/25/2016  . Atrial fibrillation, new onset ([place]) 05/25/2016  . White coat syndrome with hypertension 06/06/2014  . Spells 11/28/2013  . Leukopenia due to antineoplastic chemotherapy ([place]) 11/28/2013  . Pancytopenia due to antineoplastic chemotherapy ([place]) 08/09/2013  . 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 CATARACT EXTRACTION Left 01/01/2013  . EYE SURGERY  2012   Detached Retina  . HERNIA REPAIR    . JOINT REPLACEMENT  2006   Total Knee Left  . TONSILLECTOMY         Home Medications    Prior to Admission medications   Medication Sig Start Date End Date Taking?  Authorizing Provider  APPLE CIDER VINEGAR PO Take 15 mLs by mouth every other day.   Yes Historical Provider, MD  cholecalciferol (VITAMIN D) 1000 UNITS tablet Take 2,000 Units by mouth daily.    Yes Historical Provider, MD  Coenzyme Q10 (CO Q-10) 100 MG CAPS Take 100 mg by mouth daily.   Yes Historical Provider, MD  DANDELION PO Take 1 capsule by mouth daily. supplement    Yes Historical Provider, MD  Digestive Enzymes CAPS Take 1 capsule by mouth 2 (two) times daily.    Yes Historical Provider, MD  fish oil-omega-3 fatty acids 1000 MG capsule Take 1 g by mouth daily.   Yes Historical Provider, MD  GARLIC PO Take 1 capsule by mouth daily.   Yes Historical Provider, MD  ibuprofen (ADVIL,MOTRIN) 200 MG tablet Take 200-400 mg by mouth 2 (two) times daily as needed (for pain).   Yes Historical Provider, MD  L-Arginine 500 MG CAPS Take 500 mg by mouth daily.    Yes Historical Provider, MD  magnesium oxide (MAG-OX) 400 MG tablet Take 400 mg by mouth every morning.   Yes Historical Provider, MD  OVER THE COUNTER MEDICATION ASEA dietary supplement: Drink 1 ounce by mouth once a day   Yes Historical Provider, MD  Taurine (MEGA TAURINE) 1000 MG CAPS Take 1,000 mg by mouth every morning.   Yes Historical Provider,  MD  TURMERIC CURCUMIN PO Take 3 capsules by mouth daily.    Yes Historical Provider, MD  vitamin B-12 (CYANOCOBALAMIN) 1000 MCG tablet Take 1,000 mcg by mouth daily.   Yes Historical Provider, MD  dasatinib (SPRYCEL) 50 MG tablet Take 1 tablet (50 mg total) by mouth daily. 01/15/16   Heath Lark, MD  saw palmetto 160 MG capsule Take 160 mg by mouth daily.    Historical Provider, MD    Family History Family History  Problem Relation Age of Onset  . Arthritis Mother   . Hypertension Mother   . Diabetes Father   . Hypertension Father   . Cancer Father     bladder ca  . CAD Father     CABG  . Chronic Renal Failure Father   . Atrial fibrillation Father   . Healthy Brother   . Healthy  Brother     Social History Social History  Substance Use Topics  . Smoking status: Never Smoker  . Smokeless tobacco: Never Used  . Alcohol use 4.2 oz/week    7 Glasses of wine per week     Allergies   Corticosteroids   Review of Systems Review of Systems  ROS reviewed and all otherwise negative except for mentioned in HPI   Physical Exam Updated Vital Signs BP (!) 142/100   Pulse (!) 51   Temp 98 F (36.7 C) (Oral)   Resp 10   Ht 6\' 2"  (1.88 m)   Wt 101.2 kg   SpO2 95%   BMI 28.63 kg/m  Vitals reviewed Physical Exam Physical Examination: General appearance - alert, well appearing, and in no distress Mental status - alert, oriented to person, place, and time Eyes - no conjunctival injection, no scleral icterus Mouth - mucous membranes moist, pharynx normal without lesions Neck - supple, no significant adenopathy Chest - clear to auscultation, no wheezes, rales or rhonchi, symmetric air entry Heart - irregular and tachycardic rhythm, normal S1, S2, no murmurs, rubs, clicks or gallops Abdomen - soft, nontender, nondistended, no masses or organomegaly Neurological - alert, oriented, normal speech, Extremities - peripheral pulses normal, no pedal edema, no clubbing or cyanosis Skin - normal coloration and turgor, no rashes  ED Treatments / Results  Labs (all labs ordered are listed, but only abnormal results are displayed) Labs Reviewed  CBC - Abnormal; Notable for the following:       Result Value   Platelets 148 (*)    All other components within normal limits  BRAIN NATRIURETIC PEPTIDE - Abnormal; Notable for the following:    B Natriuretic Peptide 348.0 (*)    All other components within normal limits  BASIC METABOLIC PANEL  TSH  MAGNESIUM    EKG  EKG Interpretation  Date/Time:  Tuesday May 25 2016 12:23:30 EDT Ventricular Rate:  151 PR Interval:    QRS Duration: 98 QT Interval:  300 QTC Calculation: 475 R Axis:   105 Text Interpretation:   Atrial fibrillation with rapid ventricular response with premature ventricular or aberrantly conducted complexes Rightward axis Incomplete right bundle branch block Abnormal ECG Since previous tracing aftrial fibrillation with RVR is new Confirmed by Canary Brim  MD, MARTHA [id number]) on 05/25/2016 1:58:26 PM       Radiology Dg Chest 2 View  Result Date: 05/25/2016 CLINICAL DATA:  Shortness of breath off and on for 3 weeks worse in last 2 days, history atrial fibrillation, CML EXAM: CHEST  2 VIEW COMPARISON:  Exam at 1259 hours compared to 05/25/2016  at 0915 hours FINDINGS: Enlargement of cardiac silhouette with pulmonary vascular congestion. Atherosclerotic calcification aorta. Diffuse interstitial infiltrates question pulmonary edema and CHF. Bibasilar atelectasis and small pleural effusions. No pneumothorax. Bones demineralized. IMPRESSION: Probable CHF, unchanged from earlier exam of same date. Electronically Signed   By: Lavonia Dana M.D.   On: 05/25/2016 13:08   Dg Chest 2 View  Result Date: 05/25/2016 CLINICAL DATA:  Chronic myelogenous leukemia. Two week history of shortness of breath. EXAM: CHEST  2 VIEW COMPARISON:  October 18, 2012 chest CT FINDINGS: There is generalized interstitial prominence, likely edema. There is a minimal left pleural effusion. There is no airspace consolidation. There is cardiomegaly with pulmonary venous hypertension. There is aortic atherosclerotic calcification. No adenopathy is appreciable. There is degenerative change in the thoracic spine. No blastic or lytic bone lesions. IMPRESSION: Overall appearance is most consistent with congestive heart failure. Some or perhaps even all of the interstitial prominence could be due to medication type response/allergic type phenomenon. Lymphangitic spread of tumor could present in this manner but is felt to be less likely. No airspace consolidation. There is aortic atherosclerosis. No demonstrable adenopathy. Electronically Signed   By:  Lowella Grip III M.D.   On: 05/25/2016 09:23    Procedures Procedures (including critical care time)  Medications Ordered in ED Medications  diltiazem (CARDIZEM) 100 mg in dextrose 5% 143mL (1 mg/mL) infusion (10 mg/hr Intravenous Rate/Dose Change 05/25/16 1514)  furosemide (LASIX) injection 40 mg (40 mg Intravenous Given 05/25/16 1424)  diltiazem (CARDIZEM) 1 mg/mL load via infusion 10 mg (10 mg Intravenous Bolus from Bag 05/25/16 1434)   CRITICAL CARE Performed by: Threasa Beards Total critical care time: 45 minutes Critical care time was exclusive of separately billable procedures and treating other patients. Critical care was necessary to treat or prevent imminent or life-threatening deterioration. Critical care was time spent personally by me on the following activities: development of treatment plan with patient and/or surrogate as well as nursing, discussions with consultants, evaluation of patient's response to treatment, examination of patient, obtaining history from patient or surrogate, ordering and performing treatments and interventions, ordering and review of laboratory studies, ordering and review of radiographic studies, pulse oximetry and re-evaluation of patient's condition.  Initial Impression / Assessment and Plan / ED Course  I have reviewed the triage vital signs and the nursing notes.  Pertinent labs & imaging results that were available during my care of the patient were reviewed by me and considered in my medical decision making (see chart for details).    2:37 PM d/w cardiology- they will come to consult on the patient.    Pt presenting with c/o new onset afib with RVR. Hx of CML- also has been having new onset SOB and symptoms of PND- cxr shows evidence of CHF.  Pt started on diltiazem drip for rate control, labs reassuring in terms of cbc, electrolytes, tsh, bnp is elevated, given lasix IV.  D/w cardiology who are planning to admit patient.    Final Clinical  Impressions(s) / ED Diagnoses   Final diagnoses:  Atrial fibrillation with RVR (HCC)  Acute systolic congestive heart failure [facility])    New Prescriptions New Prescriptions   No medications on file     Alfonzo Beers, MD 05/25/16 1611

## 2016-05-25 NOTE — ED Notes (Addendum)
Placed pt on 2L [place] per MD order for O2 91%, pt denies SOB

## 2016-05-25 NOTE — Progress Notes (Signed)
ANTICOAGULATION CONSULT NOTE - Initial Consult  Pharmacy Consult for Heparin  Indication: atrial fibrillation  Allergies  Allergen Reactions  . Corticosteroids Other (See Comments)    Muscle stiffness    Patient Measurements: Height: 6\' 2"  (188 cm) Weight: 223 lb (101.2 kg) IBW/kg (Calculated) : 82.2 Heparin Dosing Weight: 101kg  Vital Signs: Temp: 98 F (36.7 C) (03/20 1235) Temp Source: Oral (03/20 1235) BP: 142/100 (03/20 1530) Pulse Rate: 51 (03/20 1538)  Labs:  Recent Labs  05/25/16 1000 05/25/16 1000 05/25/16 1233  HGB 13.5  --  13.7  HCT 39.8  --  41.0  PLT 147  --  148*  CREATININE  --  1.0 1.11    Estimated Creatinine Clearance: 83.1 mL/min (by C-G formula based on SCr of 1.11 mg/dL).   Medical History: Past Medical History:  Diagnosis Date  . Cataract    Left eye  . CML (chronic myelocytic leukemia) ([place])   . Leukocytosis, unspecified 10/17/2012  . Thrombocytosis ([place]) 10/17/2012  . White coat hypertension 06/06/2014   Medications:   (Not in a hospital admission) Scheduled:  . heparin  4,000 Units Intravenous Once   Infusions:  . diltiazem (CARDIZEM) infusion    . heparin     PRN: acetaminophen, ondansetron (ZOFRAN) IV  Assessment: Patient is a 37 yom admitted 3/20 w/ new a fib. No anticoagulation PTA. CHADS-VASc = 2 (HTN and age).   Baseline CBC shows hemoglobin within normal limits and platelet count slightly low. No bleeding noted.   Goal of Therapy:  Heparin level 0.3-0.7 units/ml Monitor platelets by anticoagulation protocol: Yes   Plan:  Give 4000 units bolus x 1 Start heparin infusion at 1400 units/hr  6-hr heparin level Daily heparin level and CBC F/U plans for oral anticoagulation   Demetrius Charity, PharmD Acute Care Pharmacy Resident  Pager: [phone] 05/25/2016

## 2016-05-25 NOTE — ED Triage Notes (Signed)
Pt sent from Dr. Alvy Bimler office for A fib.  Onset 2 weeks shortness of breath on exertion.  Onset past two nights unable to lay flat to sleep, has to sleep sitting up in chair.  Pt feels heart racing at times.  No chest pain or any other s/s noted.

## 2016-05-25 NOTE — Progress Notes (Signed)
[place] for Heparin  Indication: atrial fibrillation  Allergies  Allergen Reactions  . Corticosteroids Other (See Comments)    Muscle stiffness    Patient Measurements: Height: 6\' 2"  (188 cm) Weight: 217 lb 4.8 oz (98.6 kg) IBW/kg (Calculated) : 82.2 Heparin Dosing Weight: 101kg  Vital Signs: Temp: 99.5 F (37.5 C) (03/20 2017) Temp Source: Oral (03/20 2017) BP: 143/96 (03/20 2215) Pulse Rate: 42 (03/20 2215)  Labs:  Recent Labs  05/25/16 1000 05/25/16 1000 05/25/16 1233 05/25/16 1650 05/25/16 2312  HGB 13.5  --  13.7  --   --   HCT 39.8  --  41.0  --   --   PLT 147  --  148*  --   --   LABPROT  --   --   --  15.4*  --   INR  --   --   --  1.21  --   HEPARINUNFRC  --   --   --   --  0.23*  CREATININE  --  1.0 1.11  --   --     Estimated Creatinine Clearance: 76.1 mL/min (by C-G formula based on SCr of 1.11 mg/dL).  Assessment: 66 y.o. male with Afib for heparin  Goal of Therapy:  Heparin level 0.3-0.7 units/ml Monitor platelets by anticoagulation protocol: Yes   Plan:  Heparin 2000 units IV bolus, then increase heparin 1650 units/hr Follow-up am labs.   Phillis Knack, PharmD, BCPS  05/25/2016

## 2016-05-25 NOTE — ED Notes (Signed)
Cardiology at bedside.

## 2016-05-25 NOTE — Assessment & Plan Note (Signed)
The patient has new onset of atrial fibrillation. He has no known cardiac risk factors except for positive family history of bypass surgery in his father and his age. He denies recent chest pain. As above, I would direct him to the emergency department for further evaluation and management

## 2016-05-25 NOTE — Telephone Encounter (Signed)
Called patient with provider orders and instructions.  Scheduling message sent for lab at 10:45 and F/U at 11:15.

## 2016-05-25 NOTE — Progress Notes (Signed)
[facility] OFFICE PROGRESS NOTE  Patient Care Team: [name], MD as PCP - General (Family Medicine)  SUMMARY OF ONCOLOGIC HISTORY: Oncology History   Physical     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[place]ry Vasquez)   11/01/2012 Bone Marrow Biopsy    Bone marrow aspirate and biopsy was performed for leukocytosis and thrombocytosis. 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      11/07/2012 - 08/09/2013 Chemotherapy    He was started on Gleevec 400 mg daily.      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      08/22/2013 -  Chemotherapy    He is started on dasatinib.      11/19/2013 Tumor Marker    BCR/ABL is improving      02/22/2014 Pathology Results    BCR/ABL continues to improve and the patient is moving towards molecular remission      05/27/2014 Pathology Results    BCR/ABL b2a2 & b3a2 both are 0.05%, b2a2 & b3a2 IS both are 0.028%. Patient in MMR      09/26/2014 Pathology Results    Repeat BCR/ABL was not detectable      02/28/2015 Tumor Marker    Repeat BCR/ABL was not detectable      08/28/2015 Tumor Marker    Repeat BCR/ABL was not detectable      01/06/2016 Pathology Results    Repeat BCR/ABL was not detectable      04/15/2016 Pathology Results    Repeat BCR/ABL was not detectable       INTERVAL HISTORY: Please see below for problem oriented charting. He requests urgent evaluation today. For the past 2 days, he is not able to sleep due to significant symptoms of shortness of breath on minimal exertion and orthopnea. He has gradual symptoms of progressive shortness of breath with associated palpitation over the past few weeks. I saw him just a month ago with medication adjustment to CML.  He denies new  symptoms from that regard. No recent cough.  Denies recent chest pain, dizziness or fainting episodes. No recent diaphoresis  REVIEW OF SYSTEMS:   Constitutional: Denies fevers, chills or abnormal weight loss Eyes: Denies blurriness of vision Ears, nose, mouth, throat, and face: Denies mucositis or sore throat Cardiovascular: Denies palpitation, chest discomfort or lower extremity swelling Gastrointestinal:  Denies nausea, heartburn or change in bowel habits Skin: Denies abnormal skin rashes Lymphatics: Denies new lymphadenopathy or easy bruising Neurological:Denies numbness, tingling or new weaknesses Behavioral/Psych: Mood is stable, no new changes  All other systems were reviewed with the patient and are negative.  I have reviewed the past medical history, past surgical history, social history and family history with the patient and they are unchanged from previous note.  ALLERGIES:  is allergic to corticosteroids.  MEDICATIONS:  No current facility-administered medications for this visit.    Current Outpatient Prescriptions  Medication Sig Dispense Refill  . ALOE VERA JUICE PO Take 30 mLs by mouth every morning.    . cholecalciferol (VITAMIN D) 1000 UNITS tablet Take 2,000 Units by mouth daily.     . Coenzyme Q10 (CO Q-10) 100 MG CAPS Take 100 mg by mouth daily.    Marland Kitchen DANDELION PO Take by mouth 2 (  two) times daily. supplement    . dasatinib (SPRYCEL) 50 MG tablet Take 1 tablet (50 mg total) by mouth daily. 90 tablet 11  . Digestive Enzymes CAPS Take by mouth.    . fish oil-omega-3 fatty acids 1000 MG capsule Take 1 g by mouth daily.    Marland Kitchen GARLIC PO Take 1 capsule by mouth daily.    Marland Kitchen L-Arginine 500 MG CAPS Take by mouth.    . magnesium oxide (MAG-OX) 400 MG tablet Take 400 mg by mouth every morning.    Marland Kitchen OVER THE COUNTER MEDICATION ASEA    . saw palmetto 160 MG capsule Take 160 mg by mouth daily.    . Taurine (MEGA TAURINE) 1000 MG CAPS Take 1,000 mg by mouth every morning.     . TURMERIC CURCUMIN PO Take 1 capsule by mouth daily.    . vitamin B-12 (CYANOCOBALAMIN) 1000 MCG tablet Take 1,000 mcg by mouth daily.     Facility-Administered Medications Ordered in Other Visits  Medication Dose Route Frequency Provider Last Rate Last Dose  . diltiazem (CARDIZEM) 100 mg in dextrose 5% 132m (1 mg/mL) infusion  5-15 mg/hr Intravenous Once MAlfonzo Beers MD      . diltiazem (CARDIZEM) injection 10 mg  10 mg Intravenous Once MAlfonzo Beers MD        PHYSICAL EXAMINATION: ECOG PERFORMANCE STATUS: 1 - Symptomatic but completely ambulatory  Vitals:   05/25/16 1100  BP: 129/85  Pulse: 64  Resp: 18  Temp: 98.4 F (36.9 C)   Filed Weights   05/25/16 1100  Weight: 223 lb 1.6 oz (101.2 kg)    GENERAL:alert, no distress and comfortable SKIN: skin color, texture, turgor are normal, no rashes or significant lesions EYES: normal, Conjunctiva are pink and non-injected, sclera clear OROPHARYNX:no exudate, no erythema and lips, buccal mucosa, and tongue normal  NECK: supple, thyroid normal size, non-tender, without nodularity LYMPH:  no palpable lymphadenopathy in the cervical, axillary or inguinal LUNGS: He has mild increased breathing effort with bibasilar crackles HEART: Irregular rate and rhythm without murmurs and no lower extremity edema ABDOMEN:abdomen soft, non-tender and normal bowel sounds Musculoskeletal:no cyanosis of digits and no clubbing  NEURO: alert & oriented x 3 with fluent speech, no focal motor/sensory deficits  LABORATORY DATA:  I have reviewed the data as listed    Component Value Date/Time   NA 140 05/25/2016 1000   K 4.3 05/25/2016 1000   CL 105 10/16/2013 0826   CO2 22 05/25/2016 1000   GLUCOSE 101 05/25/2016 1000   BUN 15.4 05/25/2016 1000   CREATININE 1.0 05/25/2016 1000   CALCIUM 9.0 05/25/2016 1000   PROT 6.6 05/25/2016 1000   ALBUMIN 3.6 05/25/2016 1000   AST 32 05/25/2016 1000   ALT 49 05/25/2016 1000   ALKPHOS 79 05/25/2016  1000   BILITOT 0.80 05/25/2016 1000    No results found for: SPEP, UPEP  Lab Results  Component Value Date   WBC 4.7 05/25/2016   NEUTROABS 3.4 05/25/2016   HGB 13.7 05/25/2016   HCT 41.0 05/25/2016   MCV 91.3 05/25/2016   PLT 148 (L) 05/25/2016      Chemistry      Component Value Date/Time   NA 140 05/25/2016 1000   K 4.3 05/25/2016 1000   CL 105 10/16/2013 0826   CO2 22 05/25/2016 1000   BUN 15.4 05/25/2016 1000   CREATININE 1.0 05/25/2016 1000      Component Value Date/Time   CALCIUM 9.0 05/25/2016 1000  ALKPHOS 79 05/25/2016 1000   AST 32 05/25/2016 1000   ALT 49 05/25/2016 1000   BILITOT 0.80 05/25/2016 1000       RADIOGRAPHIC STUDIES: I have personally reviewed the radiological images as listed and agreed with the findings in the report. Dg Chest 2 View  Result Date: 05/25/2016 CLINICAL DATA:  Shortness of breath off and on for 3 weeks worse in last 2 days, history atrial fibrillation, CML EXAM: CHEST  2 VIEW COMPARISON:  Exam at 1259 hours compared to 05/25/2016 at 0915 hours FINDINGS: Enlargement of cardiac silhouette with pulmonary vascular congestion. Atherosclerotic calcification aorta. Diffuse interstitial infiltrates question pulmonary edema and CHF. Bibasilar atelectasis and small pleural effusions. No pneumothorax. Bones demineralized. IMPRESSION: Probable CHF, unchanged from earlier exam of same date. Electronically Signed   By: Lavonia Dana M.D.   On: 05/25/2016 13:08   Dg Chest 2 View  Result Date: 05/25/2016 CLINICAL DATA:  Chronic myelogenous leukemia. Two week history of shortness of breath. EXAM: CHEST  2 VIEW COMPARISON:  October 18, 2012 chest CT FINDINGS: There is generalized interstitial prominence, likely edema. There is a minimal left pleural effusion. There is no airspace consolidation. There is cardiomegaly with pulmonary venous hypertension. There is aortic atherosclerotic calcification. No adenopathy is appreciable. There is degenerative  change in the thoracic spine. No blastic or lytic bone lesions. IMPRESSION: Overall appearance is most consistent with congestive heart failure. Some or perhaps even all of the interstitial prominence could be due to medication type response/allergic type phenomenon. Lymphangitic spread of tumor could present in this manner but is felt to be less likely. No airspace consolidation. There is aortic atherosclerosis. No demonstrable adenopathy. Electronically Signed   By: Lowella Grip III M.D.   On: 05/25/2016 09:23    ASSESSMENT & PLAN:  Chronic myelogenous leukemia Recent BCR/ABL showed he is in remission. Due to complaint of chest pain, I recommend he hold his treatment for now  CHF (congestive heart failure) ([place]) The patient presented with signs and symptoms of congestive heart failure.  Chest x-ray show evidence of fluid overload. I told him this is not due to Sprycel. I spoke with the cardiologist person on call who recommended the patient be directed to the [facility] emergency department for evaluation and admission. I conveyed this information to the patient and spoke with the ED triage nurse  Atrial fibrillation, new onset [facility]) The patient has new onset of atrial fibrillation. He has no known cardiac risk factors except for positive family history of bypass surgery in his father and his age. He denies recent chest pain. As above, I would direct him to the emergency department for further evaluation and management   No orders of the defined types were placed in this encounter.  All questions were answered. The patient knows to call the clinic with any problems, questions or concerns. No barriers to learning was detected. I spent 25 minutes counseling the patient face to face. The total time spent in the appointment was 30 minutes and more than 50% was on counseling and review of test results     Heath Lark, MD 05/25/2016 1:36 PM

## 2016-05-25 NOTE — Assessment & Plan Note (Signed)
The patient presented with signs and symptoms of congestive heart failure.  Chest x-ray show evidence of fluid overload. I told him this is not due to Sprycel. I spoke with the cardiologist person on call who recommended the patient be directed to the [facility] emergency department for evaluation and admission. I conveyed this information to the patient and spoke with the ED triage nurse

## 2016-05-25 NOTE — ED Notes (Addendum)
ED Provider at bedside educating pt about A Fib and speaking with pt about complaints.

## 2016-05-25 NOTE — Progress Notes (Signed)
Patient received from ED into 3W31 with diagnosis of new onset atrial fibrillation with RVR. Patient on diltiazem and heparin drips. Patient appears stable, denies chest pain or shortness of breath. Vital signs stable, assessed at this time. Will continue to monitor.

## 2016-05-25 NOTE — Telephone Encounter (Signed)
He needs to go to [facility] first to get CXR and then come to the lab for labs I can see him at 1115 am, 15 mins Please send urgent scheduling msg and call the patient with info above

## 2016-05-25 NOTE — ED Notes (Signed)
Pt placement called r/t pt being placed on inappropriate floor. Waiting for a call back from pt placement. Pt rate not controlled with Cardizem drip.

## 2016-05-25 NOTE — Telephone Encounter (Signed)
"  I'm having trouble breathing, shortness of breath, gasping at times.  Would like appointment to come in today.  I have not been able to sleep for two days.  started a week ago and gradually progressed.  Return number [phone]."

## 2016-05-25 NOTE — Assessment & Plan Note (Signed)
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 Due to complaint of chest pain, I recommend he hold his treatment for now

## 2016-05-26 ENCOUNTER — Observation Stay (HOSPITAL_BASED_OUTPATIENT_CLINIC_OR_DEPARTMENT_OTHER): Payer: BLUE CROSS/BLUE SHIELD

## 2016-05-26 ENCOUNTER — Other Ambulatory Visit: Payer: Self-pay

## 2016-05-26 DIAGNOSIS — Z96652 Presence of left artificial knee joint: Secondary | ICD-10-CM | POA: Diagnosis present

## 2016-05-26 DIAGNOSIS — Z9889 Other specified postprocedural states: Secondary | ICD-10-CM | POA: Diagnosis not present

## 2016-05-26 DIAGNOSIS — I5031 Acute diastolic (congestive) heart failure: Secondary | ICD-10-CM | POA: Diagnosis not present

## 2016-05-26 DIAGNOSIS — Z9842 Cataract extraction status, left eye: Secondary | ICD-10-CM | POA: Diagnosis not present

## 2016-05-26 DIAGNOSIS — N400 Enlarged prostate without lower urinary tract symptoms: Secondary | ICD-10-CM | POA: Diagnosis present

## 2016-05-26 DIAGNOSIS — R0989 Other specified symptoms and signs involving the circulatory and respiratory systems: Secondary | ICD-10-CM | POA: Diagnosis present

## 2016-05-26 DIAGNOSIS — I5041 Acute combined systolic (congestive) and diastolic (congestive) heart failure: Secondary | ICD-10-CM | POA: Diagnosis not present

## 2016-05-26 DIAGNOSIS — Z8249 Family history of ischemic heart disease and other diseases of the circulatory system: Secondary | ICD-10-CM | POA: Diagnosis not present

## 2016-05-26 DIAGNOSIS — Z8052 Family history of malignant neoplasm of bladder: Secondary | ICD-10-CM | POA: Diagnosis not present

## 2016-05-26 DIAGNOSIS — I428 Other cardiomyopathies: Secondary | ICD-10-CM | POA: Diagnosis not present

## 2016-05-26 DIAGNOSIS — Z888 Allergy status to other drugs, medicaments and biological substances status: Secondary | ICD-10-CM | POA: Diagnosis not present

## 2016-05-26 DIAGNOSIS — Z8261 Family history of arthritis: Secondary | ICD-10-CM | POA: Diagnosis not present

## 2016-05-26 DIAGNOSIS — I272 Pulmonary hypertension, unspecified: Secondary | ICD-10-CM | POA: Diagnosis present

## 2016-05-26 DIAGNOSIS — I5021 Acute systolic (congestive) heart failure: Secondary | ICD-10-CM | POA: Diagnosis present

## 2016-05-26 DIAGNOSIS — Z841 Family history of disorders of kidney and ureter: Secondary | ICD-10-CM | POA: Diagnosis not present

## 2016-05-26 DIAGNOSIS — Z9221 Personal history of antineoplastic chemotherapy: Secondary | ICD-10-CM | POA: Diagnosis not present

## 2016-05-26 DIAGNOSIS — C9210 Chronic myeloid leukemia, BCR/ABL-positive, not having achieved remission: Secondary | ICD-10-CM | POA: Diagnosis present

## 2016-05-26 DIAGNOSIS — I4510 Unspecified right bundle-branch block: Secondary | ICD-10-CM | POA: Diagnosis present

## 2016-05-26 DIAGNOSIS — R002 Palpitations: Secondary | ICD-10-CM | POA: Diagnosis present

## 2016-05-26 DIAGNOSIS — I4891 Unspecified atrial fibrillation: Secondary | ICD-10-CM | POA: Diagnosis present

## 2016-05-26 DIAGNOSIS — Z833 Family history of diabetes mellitus: Secondary | ICD-10-CM | POA: Diagnosis not present

## 2016-05-26 DIAGNOSIS — I110 Hypertensive heart disease with heart failure: Secondary | ICD-10-CM | POA: Diagnosis present

## 2016-05-26 DIAGNOSIS — Z79899 Other long term (current) drug therapy: Secondary | ICD-10-CM | POA: Diagnosis not present

## 2016-05-26 LAB — ECHOCARDIOGRAM COMPLETE
HEIGHTINCHES: 74 in
WEIGHTICAEL: 3465.6 [oz_av]

## 2016-05-26 LAB — BASIC METABOLIC PANEL
Anion gap: 9 (ref 5–15)
BUN: 11 mg/dL (ref 6–20)
CO2: 23 mmol/L (ref 22–32)
Calcium: 8.8 mg/dL — ABNORMAL LOW (ref 8.9–10.3)
Chloride: 106 mmol/L (ref 101–111)
Creatinine, Ser: 1.18 mg/dL (ref 0.61–1.24)
Glucose, Bld: 141 mg/dL — ABNORMAL HIGH (ref 65–99)
POTASSIUM: 3.6 mmol/L (ref 3.5–5.1)
SODIUM: 138 mmol/L (ref 135–145)

## 2016-05-26 LAB — CBC
HCT: 39.2 % (ref 39.0–52.0)
Hemoglobin: 12.9 g/dL — ABNORMAL LOW (ref 13.0–17.0)
MCH: 29.9 pg (ref 26.0–34.0)
MCHC: 32.9 g/dL (ref 30.0–36.0)
MCV: 91 fL (ref 78.0–100.0)
PLATELETS: 143 10*3/uL — AB (ref 150–400)
RBC: 4.31 MIL/uL (ref 4.22–5.81)
RDW: 14.3 % (ref 11.5–15.5)
WBC: 3.9 10*3/uL — AB (ref 4.0–10.5)

## 2016-05-26 LAB — HEPARIN LEVEL (UNFRACTIONATED)
HEPARIN UNFRACTIONATED: 0.46 [IU]/mL (ref 0.30–0.70)
Heparin Unfractionated: 0.53 IU/mL (ref 0.30–0.70)

## 2016-05-26 MED ORDER — FUROSEMIDE 10 MG/ML IJ SOLN
40.0000 mg | Freq: Once | INTRAMUSCULAR | Status: AC
  Administered 2016-05-26: 40 mg via INTRAVENOUS
  Filled 2016-05-26: qty 4

## 2016-05-26 MED ORDER — HEPARIN BOLUS VIA INFUSION
2000.0000 [IU] | Freq: Once | INTRAVENOUS | Status: AC
  Administered 2016-05-26: 2000 [IU] via INTRAVENOUS
  Filled 2016-05-26: qty 2000

## 2016-05-26 MED ORDER — POTASSIUM CHLORIDE CRYS ER 20 MEQ PO TBCR
40.0000 meq | EXTENDED_RELEASE_TABLET | Freq: Once | ORAL | Status: AC
  Administered 2016-05-26: 40 meq via ORAL
  Filled 2016-05-26: qty 2

## 2016-05-26 MED ORDER — DILTIAZEM HCL ER COATED BEADS 180 MG PO CP24
360.0000 mg | ORAL_CAPSULE | Freq: Every day | ORAL | Status: DC
  Administered 2016-05-26: 360 mg via ORAL
  Filled 2016-05-26: qty 2

## 2016-05-26 NOTE — Progress Notes (Signed)
Dr Angelena Form paged and notified echo done at bedside.

## 2016-05-26 NOTE — Progress Notes (Signed)
Progress Note  Patient Name: Larry Vasquez Date of Encounter: 05/26/2016  Primary Cardiologist: Dr. Angelena Form  Subjective   Feels better today. No palpitations currently. Breathing has improved. He denies CP.   Inpatient Medications    Scheduled Meds:  Continuous Infusions: . diltiazem (CARDIZEM) infusion 15 mg/hr (05/26/16 0839)  . heparin 1,650 Units/hr (05/26/16 0653)   PRN Meds: acetaminophen, ondansetron (ZOFRAN) IV   Vital Signs    Vitals:   05/25/16 2020 05/25/16 2215 05/26/16 0036 05/26/16 0439  BP: 135/76 (!) 143/96 140/88 129/73  Pulse: 82 (!) 42 60 60  Resp:   18   Temp:   98.5 F (36.9 C) 99 F (37.2 C)  TempSrc:   Oral Oral  SpO2: 96% 96% 96% 94%  Weight:    216 lb 9.6 oz (98.2 kg)  Height:        Intake/Output Summary (Last 24 hours) at 05/26/16 0843 Last data filed at 05/26/16 0640  Gross per 24 hour  Intake           878.87 ml  Output             1250 ml  Net          -371.13 ml   Filed Weights   05/25/16 1234 05/25/16 1808 05/26/16 0439  Weight: 223 lb (101.2 kg) 217 lb 4.8 oz (98.6 kg) 216 lb 9.6 oz (98.2 kg)    Telemetry    Atrial fibrillation w/ CVR in the 90s - Personally Reviewed  ECG    Atrial fibrillation with RVR - Personally Reviewed  Physical Exam   GEN: No acute distress.   Neck: No JVD Cardiac:irregularly irregular rhythm, regular rate, no murmurs, rubs, or gallops.  Respiratory: faint basilar rales on the left, otherwise clear . GI: Soft, nontender, non-distended  MS: trace LE edema; No deformity. Neuro:  Nonfocal  Psych: Normal affect   Labs    Chemistry Recent Labs Lab 05/25/16 1000 05/25/16 1233  NA 140 139  K 4.3 4.5  CL  --  106  CO2 22 22  GLUCOSE 101 99  BUN 15.4 14  CREATININE 1.0 1.11  CALCIUM 9.0 9.1  PROT 6.6  --   ALBUMIN 3.6  --   AST 32  --   ALT 49  --   ALKPHOS 79  --   BILITOT 0.80  --   GFRNONAA  --  >60  GFRAA  --  >60  ANIONGAP 9 11     Hematology Recent Labs Lab  05/25/16 1000 05/25/16 1233  WBC 4.9 4.7  RBC 4.39 4.49  HGB 13.5 13.7  HCT 39.8 41.0  MCV 90.7 91.3  MCH 30.8 30.5  MCHC 33.9 33.4  RDW 14.3 14.2  PLT 147 148*    Cardiac EnzymesNo results for input(s): TROPONINI in the last 168 hours. No results for input(s): TROPIPOC in the last 168 hours.   BNP Recent Labs Lab 05/25/16 1233  BNP 348.0*     DDimer No results for input(s): DDIMER in the last 168 hours.   Radiology    Dg Chest 2 View  Result Date: 05/25/2016 CLINICAL DATA:  Shortness of breath off and on for 3 weeks worse in last 2 days, history atrial fibrillation, CML EXAM: CHEST  2 VIEW COMPARISON:  Exam at 1259 hours compared to 05/25/2016 at 0915 hours FINDINGS: Enlargement of cardiac silhouette with pulmonary vascular congestion. Atherosclerotic calcification aorta. Diffuse interstitial infiltrates question pulmonary edema and CHF. Bibasilar atelectasis and small pleural  effusions. No pneumothorax. Bones demineralized. IMPRESSION: Probable CHF, unchanged from earlier exam of same date. Electronically Signed   By: Lavonia Dana M.D.   On: 05/25/2016 13:08   Dg Chest 2 View  Result Date: 05/25/2016 CLINICAL DATA:  Chronic myelogenous leukemia. Two week history of shortness of breath. EXAM: CHEST  2 VIEW COMPARISON:  October 18, 2012 chest CT FINDINGS: There is generalized interstitial prominence, likely edema. There is a minimal left pleural effusion. There is no airspace consolidation. There is cardiomegaly with pulmonary venous hypertension. There is aortic atherosclerotic calcification. No adenopathy is appreciable. There is degenerative change in the thoracic spine. No blastic or lytic bone lesions. IMPRESSION: Overall appearance is most consistent with congestive heart failure. Some or perhaps even all of the interstitial prominence could be due to medication type response/allergic type phenomenon. Lymphangitic spread of tumor could present in this manner but is felt to be  less likely. No airspace consolidation. There is aortic atherosclerosis. No demonstrable adenopathy. Electronically Signed   By: Lowella Grip III M.D.   On: 05/25/2016 09:23    Cardiac Studies   2D Echo- pending   Patient Profile     Larry Vasquez is a 66 y.o. 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 He was sent to the ED by his oncologist for rapid heart beat. In the ED he was found to be in new onset atrial fibrillation with rapid ventricular response.  Assessment & Plan     1. New Onset Atrial Fibrillation: -- Unknown duration. -- He remains in afib however rate is better controlled with IV Cardizem, now in the 90s. BP is stable.  -- Symptoms improved -- TSH is WNL. K normal at 4.5. Mg also normal at 2.1. CBC and BMP unremarkable.  -- CXR and BNP both c/w acute CHF -- 2D echo pending. Will assess LVF, wall motion, the pericardium, valve function and LA size -- If significant systolic dysfunction, change Cardizem to beta blocker -- This patients CHA2DS2-VASc Score and unadjusted Ischemic Stroke Rate (% per year) is equal to 3.2 % stroke rate/year from a score of 3 (CHF, HTN and age 56-74) Above score calculated as 1 point each if present [CHF, HTN, DM, Vascular=MI/PAD/Aortic Plaque, Age if 25-74, or Male] Above score calculated as 2 points each if present [Age > 75, or Stroke/TIA/TE] --IV heparin has been initiated for stroke prophylaxis until it is clear that no invasive procedure will be performed. He will then need conversion to a DOAC prior to d/c.  -- If no spontaneous conversion with IV Cardizem, may consider elective TEE/DCCV prior to discharge, vs outpatient DCCV after 3 weeks of uninterrupted oral a/c  2. HTN: -- stable on IV Cardizem. Continue to monitor  3. Acute CHF/Volume Overload:   -- CXR and BNP both consistent with acute CHF. CXR with cardiomegally and pulmonary vascular  congestion. BNP abnormal at 348.  -- responding well to IV Lasix. I/O's yesterday negative 1.3L.  -- renal function and K both stable after Lasix -- Still with faint left sided basliar rales and trace LEE on exam. Give another dose of IV Lasix today, 40 mg x 1.  -- 2D echo pending to assess systolic/ diastolic function   4. Chronic Myelogenous Leukemia: on Sprycel as an outpatient  Signed, Lyda Jester, PA-C  05/26/2016, 8:43 AM    I have personally seen and examined this patient with Lyda Jester, PA-C. I  agree with the assessment and plan as outlined above. He is still in atrial fib this am but rate controlled on IV Cardizem. Will switch to po Cardizem today. Continue IV heparin until we see if his LV function is normal. If LV function normal, change to Eliquis for anticoagulation. Echo pending for today. He has diuresed well and breathing is better. One more dose IV Lasix today. Likely acute diastolic CHF with atrial fib/RVR.   Lauree Chandler 05/26/2016 10:56 AM

## 2016-05-26 NOTE — Progress Notes (Signed)
  Echocardiogram 2D Echocardiogram has been performed.  Larry Vasquez 05/26/2016, 2:46 PM

## 2016-05-26 NOTE — Progress Notes (Addendum)
ANTICOAGULATION CONSULT NOTE - Follow Up Consult  Pharmacy Consult for Heparin Indication: atrial fibrillation  Allergies  Allergen Reactions  . Corticosteroids Other (See Comments)    Muscle stiffness    Patient Measurements: Height: 6\' 2"  (188 cm) Weight: 216 lb 9.6 oz (98.2 kg) IBW/kg (Calculated) : 82.2  Vital Signs: Temp: 99 F (37.2 C) (03/21 0439) Temp Source: Oral (03/21 0439) BP: 129/73 (03/21 0439) Pulse Rate: 103 (03/21 0800)  Labs:  Recent Labs  05/25/16 1000 05/25/16 1000 05/25/16 1233 05/25/16 1650 05/25/16 2312 05/26/16 0858  HGB 13.5  --  13.7  --   --  12.9*  HCT 39.8  --  41.0  --   --  39.2  PLT 147  --  148*  --   --  143*  LABPROT  --   --   --  15.4*  --   --   INR  --   --   --  1.21  --   --   HEPARINUNFRC  --   --   --   --  0.23* 0.53  CREATININE  --  1.0 1.11  --   --  1.18    Estimated Creatinine Clearance: 71.6 mL/min (by C-G formula based on SCr of 1.18 mg/dL).   Medications:  Scheduled:  . diltiazem  360 mg Oral Daily    Assessment: 66yo male with new AFib with sx x 3-4weeks.  Heparin level is therapeutic this AM following adjustment overnight.  Hg and pltc are stable.  No bleeding noted.  Pt on multiple supplements at home, MD has noted.  None of these have been added recently.  Plan is to change to DOAC once work up is completed.  Goal of Therapy:  Heparin level 0.3-0.7 Monitor platelets by anticoagulation protocol: Yes   Plan:  Continue heparin 1650 units/hr Repeat heparin level 6hr to verify therapeutic x 2 Daily Heparin level, CBC, monitor for s/s of bleeding F/U change over to oral anticoagulation  Gracy Bruins, PharmD Clinical Pharmacist [facility]  1630  Addendum  Repeat heparin level 0.46, therapeutic on current rate.  Will continue same and f/u in AM.  Gracy Bruins, PharmD Clinical Pharmacist [facility]

## 2016-05-27 DIAGNOSIS — I428 Other cardiomyopathies: Secondary | ICD-10-CM

## 2016-05-27 DIAGNOSIS — I5041 Acute combined systolic (congestive) and diastolic (congestive) heart failure: Secondary | ICD-10-CM

## 2016-05-27 LAB — CBC
HCT: 37.2 % — ABNORMAL LOW (ref 39.0–52.0)
Hemoglobin: 12.2 g/dL — ABNORMAL LOW (ref 13.0–17.0)
MCH: 29.8 pg (ref 26.0–34.0)
MCHC: 32.8 g/dL (ref 30.0–36.0)
MCV: 91 fL (ref 78.0–100.0)
PLATELETS: 144 10*3/uL — AB (ref 150–400)
RBC: 4.09 MIL/uL — ABNORMAL LOW (ref 4.22–5.81)
RDW: 14.3 % (ref 11.5–15.5)
WBC: 3.9 10*3/uL — ABNORMAL LOW (ref 4.0–10.5)

## 2016-05-27 LAB — HEPARIN LEVEL (UNFRACTIONATED): Heparin Unfractionated: 0.5 IU/mL (ref 0.30–0.70)

## 2016-05-27 LAB — BASIC METABOLIC PANEL
Anion gap: 9 (ref 5–15)
BUN: 16 mg/dL (ref 6–20)
CALCIUM: 8.5 mg/dL — AB (ref 8.9–10.3)
CO2: 24 mmol/L (ref 22–32)
CREATININE: 1.19 mg/dL (ref 0.61–1.24)
Chloride: 105 mmol/L (ref 101–111)
GFR calc non Af Amer: >60 mL/min (ref >60)
GLUCOSE: 103 mg/dL — AB (ref 65–99)
Potassium: 3.8 mmol/L (ref 3.5–5.1)
Sodium: 138 mmol/L (ref 135–145)

## 2016-05-27 MED ORDER — FUROSEMIDE 20 MG PO TABS
20.0000 mg | ORAL_TABLET | Freq: Every day | ORAL | Status: DC
Start: 2016-05-27 — End: 2016-05-27
  Administered 2016-05-27: 20 mg via ORAL
  Filled 2016-05-27: qty 1

## 2016-05-27 MED ORDER — APIXABAN 5 MG PO TABS
5.0000 mg | ORAL_TABLET | Freq: Two times a day (BID) | ORAL | Status: DC
  Administered 2016-05-27: 5 mg via ORAL
  Filled 2016-05-27: qty 1

## 2016-05-27 MED ORDER — LOSARTAN POTASSIUM 25 MG PO TABS
25.0000 mg | ORAL_TABLET | Freq: Every day | ORAL | Status: DC
  Administered 2016-05-27: 25 mg via ORAL
  Filled 2016-05-27: qty 1

## 2016-05-27 MED ORDER — FUROSEMIDE 20 MG PO TABS: 20.0000 mg | ORAL_TABLET | Freq: Every day | ORAL | 5 refills | Status: DC

## 2016-05-27 MED ORDER — LOSARTAN POTASSIUM 25 MG PO TABS: 25.0000 mg | ORAL_TABLET | Freq: Every day | ORAL | 5 refills | Status: DC

## 2016-05-27 MED ORDER — APIXABAN 5 MG PO TABS: 5.0000 mg | ORAL_TABLET | Freq: Two times a day (BID) | ORAL | 11 refills | Status: DC

## 2016-05-27 MED ORDER — METOPROLOL SUCCINATE ER 100 MG PO TB24
100.0000 mg | ORAL_TABLET | Freq: Every day | ORAL | Status: DC
  Administered 2016-05-27: 100 mg via ORAL
  Filled 2016-05-27: qty 1

## 2016-05-27 MED ORDER — APIXABAN 5 MG PO TABS: 5.0000 mg | ORAL_TABLET | Freq: Two times a day (BID) | ORAL | 0 refills | Status: DC

## 2016-05-27 MED ORDER — METOPROLOL SUCCINATE ER 100 MG PO TB24: 100.0000 mg | ORAL_TABLET | Freq: Every day | ORAL | 5 refills | Status: DC

## 2016-05-27 NOTE — Discharge Summary (Signed)
Discharge Summary    Patient ID: Larry Vasquez,  MRN: [id number], DOB/AGE: [birth year] 66 y.o.  Admit date: 05/25/2016 Discharge date: 05/27/2016  Primary Care Provider: Woody Seller Primary Cardiologist: Dr. Angelena Form  Discharge Diagnoses    Principal Problem:   Atrial fibrillation, new onset [facility]) Active Problems:   CHF (congestive heart failure) ([place])   Essential (primary) hypertension   Atrial fibrillation (HCC)   Allergies Allergies  Allergen Reactions  . Corticosteroids Other (See Comments)    Muscle stiffness    Diagnostic Studies/Procedures     Study Conclusions  - Left ventricle: The cavity size was normal. Wall thickness was   increased in a pattern of mild LVH. Systolic function was   moderately reduced. The estimated ejection fraction was in the   range of 35% to 40%. Diffuse hypokinesis. Indeterminant diastolic   function (atrial fibrillation). - Aortic valve: There was no stenosis. There was trivial   regurgitation. - Aorta: Mildly dilated aortic root. Aortic root dimension: 41 mm   (ED). - Mitral valve: There was mild regurgitation. - Left atrium: The atrium was severely dilated. - Right ventricle: The cavity size was normal. Systolic function   was moderately reduced. - Right atrium: The atrium was moderately to severely dilated. - Tricuspid valve: Peak RV-RA gradient (S): 23 mm Hg. - Pulmonary arteries: PA peak pressure: 38 mm Hg (S). - Systemic veins: IVC measured 2.4 cm with < 50% respirophasic   variation, suggesting RA pressure 15 mmHg. - Pericardium, extracardiac: A trivial pericardial effusion was   identified posterior to the heart.  Impressions:  - The patient was in atrial fibrillation. Normal LV size with mild   LV hypertrophy. EF 35-40%, diffuse hypokinesis. Normal RV size   with moderately decreased systolic function. Mild pulmonary   hypertension. Mild MR.   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 He was sent to the ED by his oncologist on 05/25/16 for rapid heart beat. In the ED, he was found to be in new onset atrial fibrillation with a rapid ventricular response. Duration unknown. Pt was also noted to be volume overloaded with acute CHF. CXR in the ED demonstrated cardiomegally and pulmonary vascular congestion. BNP was abnormal at 348. K, Mg, TSH and H/H were all stable and WNL. He was admitted for afib management and diuresis for CHF.   Hospital Course     Given unknown duration of his atrial fibrillation, DCCV was not attempted in the ED. Pt was admitted to telemetry and placed on IV Cardizem for rate control. His CHA2DS2 VASc score was calculated at 3 for CHF, HTN and age 67-74. It was elected to initially treat with IV heparin, until further w/u was conducted to see if he would require invasive procedures. Pharmacy assisted with heparin dosing. His rate improved with IV Cardizem, however he remained in afib. Symptoms improved with rate control as well as with diuresis. He was treated with IV lasix and had a good diuretic response. Renal function remained stable as well as his electrolytes. A 2D echo was obtained which demonstrated reduced LVEF at 35-40% with diffuse hypokinesis. The LA was severely dilated. His LV dysfunction was felt to be likely tachy induced secondary to rapid afib. His Cardizem was discontinued and he was placed on a BB, metoprolol succinate 100 mg/day. LHC not indicated at this time. Recommendations  are to repeat a 2D echo in 1 month, now that he his rate controlled. Heparin was discontinued and he was placed on Eliquis, 5 mg BID, for anticoagulation. Plan will be for a repeat EKG in  4 weeks and will plan for DCCV if still in afib.   On hospital day 2, he was seen and examined by Dr. Angelena Form. His rate was controlled, volume was stable and he  was asymptomatic. Dr. Angelena Form determined he was stable for discharge home. [facility] f/u will be arranged with an APP in 1 week. As outlined above, plan for elective outpatient DCCV in 4 weeks if still in atrial fibrillation.   Consultants: none   Discharge Vitals Blood pressure 133/78, pulse 85, temperature 98.3 F (36.8 C), temperature source Oral, resp. rate 18, height 6\' 2"  (1.88 m), weight 216 lb 1.6 oz (98 kg), SpO2 94 %.  Filed Weights   05/25/16 1808 05/26/16 0439 05/27/16 0500  Weight: 217 lb 4.8 oz (98.6 kg) 216 lb 9.6 oz (98.2 kg) 216 lb 1.6 oz (98 kg)    Labs & Radiologic Studies    CBC  Recent Labs  05/25/16 1000  05/26/16 0858 05/27/16 0310  WBC 4.9  < > 3.9* 3.9*  NEUTROABS 3.4  --   --   --   HGB 13.5  < > 12.9* 12.2*  HCT 39.8  < > 39.2 37.2*  MCV 90.7  < > 91.0 91.0  PLT 147  < > 143* 144*  < > = values in this interval not displayed. Basic Metabolic Panel  Recent Labs  05/25/16 1320 05/26/16 0858 05/27/16 0310  NA  --  138 138  K  --  3.6 3.8  CL  --  106 105  CO2  --  23 24  GLUCOSE  --  141* 103*  BUN  --  11 16  CREATININE  --  1.18 1.19  CALCIUM  --  8.8* 8.5*  MG 2.1  --   --    Liver Function Tests  Recent Labs  05/25/16 1000  AST 32  ALT 49  ALKPHOS 79  BILITOT 0.80  PROT 6.6  ALBUMIN 3.6   No results for input(s): LIPASE, AMYLASE in the last 72 hours. Cardiac Enzymes No results for input(s): CKTOTAL, CKMB, CKMBINDEX, TROPONINI in the last 72 hours. BNP Invalid input(s): POCBNP D-Dimer No results for input(s): DDIMER in the last 72 hours. Hemoglobin A1C No results for input(s): HGBA1C in the last 72 hours. Fasting Lipid Panel No results for input(s): CHOL, HDL, LDLCALC, TRIG, CHOLHDL, LDLDIRECT in the last 72 hours. Thyroid Function Tests  Recent Labs  05/25/16 1320  TSH 2.131   _____________  Dg Chest 2 View  Result Date: 05/25/2016 CLINICAL DATA:  Shortness of breath off and on for 3 weeks worse in last 2  days, history atrial fibrillation, CML EXAM: CHEST  2 VIEW COMPARISON:  Exam at 1259 hours compared to 05/25/2016 at 0915 hours FINDINGS: Enlargement of cardiac silhouette with pulmonary vascular congestion. Atherosclerotic calcification aorta. Diffuse interstitial infiltrates question pulmonary edema and CHF. Bibasilar atelectasis and small pleural effusions. No pneumothorax. Bones demineralized. IMPRESSION: Probable CHF, unchanged from earlier exam of same date. Electronically Signed   By: Lavonia Dana M.D.   On: 05/25/2016 13:08   Dg Chest 2 View  Result Date: 05/25/2016 CLINICAL DATA:  Chronic myelogenous leukemia. Two week history of shortness of breath. EXAM: CHEST  2 VIEW COMPARISON:  October 18, 2012 chest CT FINDINGS: There  is generalized interstitial prominence, likely edema. There is a minimal left pleural effusion. There is no airspace consolidation. There is cardiomegaly with pulmonary venous hypertension. There is aortic atherosclerotic calcification. No adenopathy is appreciable. There is degenerative change in the thoracic spine. No blastic or lytic bone lesions. IMPRESSION: Overall appearance is most consistent with congestive heart failure. Some or perhaps even all of the interstitial prominence could be due to medication type response/allergic type phenomenon. Lymphangitic spread of tumor could present in this manner but is felt to be less likely. No airspace consolidation. There is aortic atherosclerosis. No demonstrable adenopathy. Electronically Signed   By: Lowella Grip III M.D.   On: 05/25/2016 09:23   Disposition   Pt is being discharged home today in good condition.  Follow-up Plans & Appointments    Follow-up Information    Lyda Jester, PA-C Follow up on 06/03/2016.   Specialties:  Cardiology, Radiology Why:  9:30 AM cardiology hospital f/u Contact information: [place] [address] [phone]          Discharge Instructions     Diet - low sodium heart healthy    Complete by:  As directed    Increase activity slowly    Complete by:  As directed       Discharge Medications   Current Discharge Medication List    START taking these medications   Details  !! apixaban (ELIQUIS) 5 MG TABS tablet Take 1 tablet (5 mg total) by mouth 2 (two) times daily. Qty: 60 tablet, Refills: 11    !! apixaban (ELIQUIS) 5 MG TABS tablet Take 1 tablet (5 mg total) by mouth 2 (two) times daily. Qty: 60 tablet, Refills: 0    furosemide (LASIX) 20 MG tablet Take 1 tablet (20 mg total) by mouth daily. Qty: 30 tablet, Refills: 5    losartan (COZAAR) 25 MG tablet Take 1 tablet (25 mg total) by mouth daily. Qty: 30 tablet, Refills: 5    metoprolol succinate (TOPROL-XL) 100 MG 24 hr tablet Take 1 tablet (100 mg total) by mouth daily. Take with or immediately following a meal. Qty: 5 tablet, Refills: 5     !! - Potential duplicate medications found. Please discuss with provider.    CONTINUE these medications which have NOT CHANGED   Details  APPLE CIDER VINEGAR PO Take 15 mLs by mouth every other day.    cholecalciferol (VITAMIN D) 1000 UNITS tablet Take 2,000 Units by mouth daily.     Coenzyme Q10 (CO Q-10) 100 MG CAPS Take 100 mg by mouth daily.    DANDELION PO Take 1 capsule by mouth daily. supplement    Associated Diagnoses: 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HCC)    Digestive Enzymes CAPS Take 1 capsule by mouth 2 (two) times daily.     fish oil-omega-3 fatty acids 1000 MG capsule Take 1 g by mouth daily.    GARLIC PO Take 1 capsule by mouth daily.   Associated Diagnoses: 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HCC)    L-Arginine 500 MG CAPS Take 500 mg by mouth daily.     magnesium oxide (MAG-OX) 400 MG tablet Take 400 mg by mouth every morning.    OVER THE COUNTER MEDICATION ASEA dietary supplement: Drink 1 ounce by mouth once a day    Taurine (MEGA TAURINE) 1000 MG CAPS Take 1,000 mg by mouth every morning.    TURMERIC  CURCUMIN PO Take 3 capsules by mouth daily.    Associated Diagnoses: 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  HCC)    vitamin B-12 (CYANOCOBALAMIN) 1000 MCG tablet Take 1,000 mcg by mouth daily.    dasatinib (SPRYCEL) 50 MG tablet Take 1 tablet (50 mg total) by mouth daily. Qty: 90 tablet, Refills: 11    saw palmetto 160 MG capsule Take 160 mg by mouth daily.      STOP taking these medications     ibuprofen (ADVIL,MOTRIN) 200 MG tablet            Outstanding Labs/Studies   -- Repeat EKG at time of [facility] F/u and in 4 weeks -- Outpatient DCCV in 4 weeks if still in atrial fibrillation -- Repeat limited Echo to reassess LVF in 4 weeks   Duration of Discharge Encounter   Greater than 30 minutes including physician time.  Signed, Lyda Jester PA-C 05/27/2016, 11:31 AM

## 2016-05-27 NOTE — Progress Notes (Addendum)
Progress Note  Patient Name: Larry Vasquez Date of Encounter: 05/27/2016  Primary Cardiologist: Gwinda Maine  Subjective   No chest pain. Dyspnea improved. No awareness of palpitations.   Inpatient Medications    Scheduled Meds: . diltiazem  360 mg Oral Daily   Continuous Infusions: . heparin 1,650 Units/hr (05/26/16 1818)   PRN Meds: acetaminophen, ondansetron (ZOFRAN) IV   Vital Signs    Vitals:   05/26/16 1500 05/26/16 1600 05/26/16 2157 05/27/16 0500  BP:   129/90 133/78  Pulse:   64 85  Resp:   18 18  Temp:   98.1 F (36.7 C) 98.3 F (36.8 C)  TempSrc:   Oral Oral  SpO2: 94% 95% 94% 94%  Weight:    216 lb 1.6 oz (98 kg)  Height:        Intake/Output Summary (Last 24 hours) at 05/27/16 0751 Last data filed at 05/27/16 0600  Gross per 24 hour  Intake            785.5 ml  Output             1975 ml  Net          -1189.5 ml   Filed Weights   05/25/16 1808 05/26/16 0439 05/27/16 0500  Weight: 217 lb 4.8 oz (98.6 kg) 216 lb 9.6 oz (98.2 kg) 216 lb 1.6 oz (98 kg)    Telemetry    Atrial fib, rate 90s- Personally Reviewed  ECG  n/a  Physical Exam   GEN: No acute distress.   Neck: No JVD Cardiac: irreg irreg , no murmurs, rubs, or gallops.  Respiratory: Clear to auscultation bilaterally. GI: Soft, nontender, non-distended  MS: No edema; No deformity. Neuro:  Nonfocal  Psych: Normal affect   Labs    Chemistry Recent Labs Lab 05/25/16 1000 05/25/16 1233 05/26/16 0858 05/27/16 0310  NA 140 139 138 138  K 4.3 4.5 3.6 3.8  CL  --  106 106 105  CO2 22 22 23 24   GLUCOSE 101 99 141* 103*  BUN 15.4 14 11 16   CREATININE 1.0 1.11 1.18 1.19  CALCIUM 9.0 9.1 8.8* 8.5*  PROT 6.6  --   --   --   ALBUMIN 3.6  --   --   --   AST 32  --   --   --   ALT 49  --   --   --   ALKPHOS 79  --   --   --   BILITOT 0.80  --   --   --   GFRNONAA  --  >60 >60 >60  GFRAA  --  >60 >60 >60  ANIONGAP 9 11 9 9      Hematology Recent Labs Lab  05/25/16 1233 05/26/16 0858 05/27/16 0310  WBC 4.7 3.9* 3.9*  RBC 4.49 4.31 4.09*  HGB 13.7 12.9* 12.2*  HCT 41.0 39.2 37.2*  MCV 91.3 91.0 91.0  MCH 30.5 29.9 29.8  MCHC 33.4 32.9 32.8  RDW 14.2 14.3 14.3  PLT 148* 143* 144*   BNP Recent Labs Lab 05/25/16 1233  BNP 348.0*     DDimer No results for input(s): DDIMER in the last 168 hours.   Radiology    Dg Chest 2 View  Result Date: 05/25/2016 CLINICAL DATA:  Shortness of breath off and on for 3 weeks worse in last 2 days, history atrial fibrillation, CML EXAM: CHEST  2 VIEW COMPARISON:  Exam at 1259 hours compared to 05/25/2016 at  0915 hours FINDINGS: Enlargement of cardiac silhouette with pulmonary vascular congestion. Atherosclerotic calcification aorta. Diffuse interstitial infiltrates question pulmonary edema and CHF. Bibasilar atelectasis and small pleural effusions. No pneumothorax. Bones demineralized. IMPRESSION: Probable CHF, unchanged from earlier exam of same date. Electronically Signed   By: Lavonia Dana M.D.   On: 05/25/2016 13:08   Dg Chest 2 View  Result Date: 05/25/2016 CLINICAL DATA:  Chronic myelogenous leukemia. Two week history of shortness of breath. EXAM: CHEST  2 VIEW COMPARISON:  October 18, 2012 chest CT FINDINGS: There is generalized interstitial prominence, likely edema. There is a minimal left pleural effusion. There is no airspace consolidation. There is cardiomegaly with pulmonary venous hypertension. There is aortic atherosclerotic calcification. No adenopathy is appreciable. There is degenerative change in the thoracic spine. No blastic or lytic bone lesions. IMPRESSION: Overall appearance is most consistent with congestive heart failure. Some or perhaps even all of the interstitial prominence could be due to medication type response/allergic type phenomenon. Lymphangitic spread of tumor could present in this manner but is felt to be less likely. No airspace consolidation. There is aortic atherosclerosis.  No demonstrable adenopathy. Electronically Signed   By: Lowella Grip III M.D.   On: 05/25/2016 09:23    Cardiac Studies   Echo 05/26/16: Left ventricle: The cavity size was normal. Wall thickness was   increased in a pattern of mild LVH. Systolic function was   moderately reduced. The estimated ejection fraction was in the   range of 35% to 40%. Diffuse hypokinesis. Indeterminant diastolic   function (atrial fibrillation). - Aortic valve: There was no stenosis. There was trivial   regurgitation. - Aorta: Mildly dilated aortic root. Aortic root dimension: 41 mm   (ED). - Mitral valve: There was mild regurgitation. - Left atrium: The atrium was severely dilated. - Right ventricle: The cavity size was normal. Systolic function   was moderately reduced. - Right atrium: The atrium was moderately to severely dilated. - Tricuspid valve: Peak RV-RA gradient (S): 23 mm Hg. - Pulmonary arteries: PA peak pressure: 38 mm Hg (S). - Systemic veins: IVC measured 2.4 cm with < 50% respirophasic   variation, suggesting RA pressure 15 mmHg. - Pericardium, extracardiac: A trivial pericardial effusion was   identified posterior to the heart.  Impressions:  - The patient was in atrial fibrillation. Normal LV size with mild   LV hypertrophy. EF 35-40%, diffuse hypokinesis. Normal RV size   with moderately decreased systolic function. Mild pulmonary   hypertension. Mild MR.  Patient Profile     66 y.o. male with h/o HTN admitted with atrial fib with RVR, volume overload   Assessment & Plan    1. Atrial fib, new onset: He is rate controlled this am but will change Cardizem to Toprol given his LV dysfunction. Will start Toprol 100 mg po Qdaily. Will stop heparin and start Eliquis 5 mg po BID. Will plan rate control strategy as he is asymptomatic with his atrial fibrillation. Attempt DCCV in month if he does not convert spontaneously.    2. Non-ischemic Cardiomyopathy: This may be due to his  atrial fib. Will plan to repeat echo in one month now that he is rate controlled. Will start beta blocker and ARB  3. Acute diastolic/systolic CHF: Volume status is better. Continue Lasix 20 mg daily  Discharge home today. Follow up office APP or me one week.   Signed, Lauree Chandler, MD  05/27/2016, 7:51 AM

## 2016-05-27 NOTE — Care Management Note (Signed)
Case Management Note  Patient Details  Name: Larry Vasquez MRN: [id number] Date of Birth: 1950/11/01  Subjective/Objective:  Pt presented for Atrial Fib. Plan for d/c on Eliquis.                   Action/Plan: CM did provide pt with 30 day free card and co pay card. CM unable to get cost. [facility] on [address] has medication available and pharmacy will be able to provide cost. No further needs from CM at this time.   Expected Discharge Date:  05/27/16               Expected Discharge Plan:  Home/Self Care  In-House Referral:     Discharge planning Services  CM Consult, Medication Assistance  Post Acute Care Choice:  NA Choice offered to:  NA  DME Arranged:  [name] DME Agency:  NA  HH Arranged:  NA HH Agency:  NA  Status of Service:  Completed, signed off  If discussed at [place] of Stay Meetings, dates discussed:    Additional Comments:  Bethena Roys, RN 05/27/2016, 12:39 PM

## 2016-05-27 NOTE — Discharge Instructions (Addendum)
Information on my medicine - ELIQUIS (apixaban)  This medication education was reviewed with me or my healthcare representative as part of my discharge preparation.  The pharmacist that spoke with me during my hospital stay was:  Jaquita Folds, [facility]  Why was Eliquis prescribed for you? Eliquis was prescribed for you to reduce the risk of a blood clot forming that can cause a stroke if you have a medical condition called atrial fibrillation (a type of irregular heartbeat).  What do You need to know about Eliquis ? Take your Eliquis TWICE DAILY - one tablet in the morning and one tablet in the evening with or without food. If you have difficulty swallowing the tablet whole please discuss with your pharmacist how to take the medication safely.  Take Eliquis exactly as prescribed by your doctor and DO NOT stop taking Eliquis without talking to the doctor who prescribed the medication.  Stopping may increase your risk of developing a stroke.  Refill your prescription before you run out.  After discharge, you should have regular check-up appointments with your healthcare provider that is prescribing your Eliquis.  In the future your dose may need to be changed if your kidney function or weight changes by a significant amount or as you get older.  What do you do if you miss a dose? If you miss a dose, take it as soon as you remember on the same day and resume taking twice daily.  Do not take more than one dose of ELIQUIS at the same time to make up a missed dose.  Important Safety Information A possible side effect of Eliquis is bleeding. You should call your healthcare provider right away if you experience any of the following: ? Bleeding from an injury or your nose that does not stop. ? Unusual colored urine (red or dark brown) or unusual colored stools (red or black). ? Unusual bruising for unknown reasons. ? A serious fall or if you hit your head (even if there is no bleeding).  Some  medicines may interact with Eliquis and might increase your risk of bleeding or clotting while on Eliquis. To help avoid this, consult your healthcare provider or pharmacist prior to using any new prescription or non-prescription medications, including herbals, vitamins, non-steroidal anti-inflammatory drugs (NSAIDs) and supplements.  Recommend stopping Saw Palmetto, Garlic, and Turmeric supplements and to check into possible interactions before starting new supplements.  This website has more information on Eliquis (apixaban): http://www.eliquis.com/eliquis/home   Atrial Fibrillation Atrial fibrillation is a type of irregular or rapid heartbeat (arrhythmia). In atrial fibrillation, the heart quivers continuously in a chaotic pattern. This occurs when parts of the heart receive disorganized signals that make the heart unable to pump blood normally. This can increase the risk for stroke, heart failure, and other heart-related conditions. There are different types of atrial fibrillation, including:  Paroxysmal atrial fibrillation. This type starts suddenly, and it usually stops on its own shortly after it starts.  Persistent atrial fibrillation. This type often lasts longer than a week. It may stop on its own or with treatment.  Long-lasting persistent atrial fibrillation. This type lasts longer than 12 months.  Permanent atrial fibrillation. This type does not go away. Talk with your health care provider to learn about the type of atrial fibrillation that you have. What are the causes? This condition is caused by some heart-related conditions or procedures, including:  A heart attack.  Coronary artery disease.  Heart failure.  Heart valve conditions.  High  blood pressure.  Inflammation of the sac that surrounds the heart (pericarditis).  Heart surgery.  Certain heart rhythm disorders, such as Wolf-Parkinson-White syndrome. Other causes include:  Pneumonia.  Obstructive sleep  apnea.  Blockage of an artery in the lungs (pulmonary embolism, or PE).  Lung cancer.  Chronic lung disease.  Thyroid problems, especially if the thyroid is overactive (hyperthyroidism).  Caffeine.  Excessive alcohol use or illegal drug use.  Use of some medicines, including certain decongestants and diet pills. Sometimes, the cause cannot be found. What increases the risk? This condition is more likely to develop in:  People who are older in age.  People who smoke.  People who have diabetes mellitus.  People who are overweight (obese).  Athletes who exercise vigorously. What are the signs or symptoms? Symptoms of this condition include:  A feeling that your heart is beating rapidly or irregularly.  A feeling of discomfort or pain in your chest.  Shortness of breath.  Sudden light-headedness or weakness.  Getting tired easily during exercise. In some cases, there are no symptoms. How is this diagnosed? Your health care provider may be able to detect atrial fibrillation when taking your pulse. If detected, this condition may be diagnosed with:  An electrocardiogram (ECG).  A Holter monitor test that records your heartbeat patterns over a 24-hour period.  Transthoracic echocardiogram (TTE) to evaluate how blood flows through your heart.  Transesophageal echocardiogram (TEE) to view more detailed images of your heart.  A stress test.  Imaging tests, such as a CT scan or chest X-ray.  Blood tests. How is this treated? The main goals of treatment are to prevent blood clots from forming and to keep your heart beating at a normal rate and rhythm. The type of treatment that you receive depends on many factors, such as your underlying medical conditions and how you feel when you are experiencing atrial fibrillation. This condition may be treated with:  Medicine to slow down the heart rate, bring the hearts rhythm back to normal, or prevent clots from  forming.  Electrical cardioversion. This is a procedure that resets your hearts rhythm by delivering a controlled, low-energy shock to the heart through your skin.  Different types of ablation, such as catheter ablation, catheter ablation with pacemaker, or surgical ablation. These procedures destroy the heart tissues that send abnormal signals. When the pacemaker is used, it is placed under your skin to help your heart beat in a regular rhythm. Follow these instructions at home:  Take over-the counter and prescription medicines only as told by your health care provider.  If your health care provider prescribed a blood-thinning medicine (anticoagulant), take it exactly as told. Taking too much blood-thinning medicine can cause bleeding. If you do not take enough blood-thinning medicine, you will not have the protection that you need against stroke and other problems.  Do not use tobacco products, including cigarettes, chewing tobacco, and e-cigarettes. If you need help quitting, ask your health care provider.  If you have obstructive sleep apnea, manage your condition as told by your health care provider.  Do not drink alcohol.  Do not drink beverages that contain caffeine, such as coffee, soda, and tea.  Maintain a healthy weight. Do not use diet pills unless your health care provider approves. Diet pills may make heart problems worse.  Follow diet instructions as told by your health care provider.  Exercise regularly as told by your health care provider.  Keep all follow-up visits as told by your  health care provider. This is important. How is this prevented?  Avoid drinking beverages that contain caffeine or alcohol.  Avoid certain medicines, especially medicines that are used for breathing problems.  Avoid certain herbs and herbal medicines, such as those that contain ephedra or ginseng.  Do not use illegal drugs, such as cocaine and amphetamines.  Do not smoke.  Manage  your high blood pressure. Contact a health care provider if:  You notice a change in the rate, rhythm, or strength of your heartbeat.  You are taking an anticoagulant and you notice increased bruising.  You tire more easily when you exercise or exert yourself. Get help right away if:  You have chest pain, abdominal pain, sweating, or weakness.  You feel nauseous.  You notice blood in your vomit, bowel movement, or urine.  You have shortness of breath.  You suddenly have swollen feet and ankles.  You feel dizzy.  You have sudden weakness or numbness of the face, arm, or leg, especially on one side of the body.  You have trouble speaking, trouble understanding, or both (aphasia).  Your face or your eyelid droops on one side. These symptoms may represent a serious problem that is an emergency. Do not wait to see if the symptoms will go away. Get medical help right away. Call your local emergency services (911 in the [place] Do not drive yourself to the hospital. This information is not intended to replace advice given to you by your health care provider. Make sure you discuss any questions you have with your health care provider. Document Released: 02/22/2005 Document Revised: 07/02/2015 Document Reviewed: 06/19/2014 Elsevier Interactive Patient Education  2017 [employer].    Heart Failure Heart failure is a condition in which the heart has trouble pumping blood because it has become weak or stiff. This means that the heart does not pump blood efficiently for the body to work well. For some people with heart failure, fluid may back up into the lungs and there may be swelling (edema) in the lower legs. Heart failure is usually a long-term (chronic) condition. It is important for you to take good care of yourself and follow the treatment plan from your health care provider. What are the causes? This condition is caused by some health problems, including:  High blood pressure  (hypertension). Hypertension causes the heart muscle to work harder than normal. High blood pressure eventually causes the heart to become stiff and weak.  Coronary artery disease (CAD). CAD is the buildup of cholesterol and fat (plaques) in the arteries of the heart.  Heart attack (myocardial infarction). Injured tissue, which is caused by the heart attack, does not contract as well and the heart's ability to pump blood is weakened.  Abnormal heart valves. When the heart valves do not open and close properly, the heart muscle must pump harder to keep the blood flowing.  Heart muscle disease (cardiomyopathy or myocarditis). Heart muscle disease is damage to the heart muscle from a variety of causes, such as drug or alcohol abuse, infections, or unknown causes. These can increase the risk of heart failure.  Lung disease. When the lungs do not work properly, the heart must work harder. What increases the risk? Risk of heart failure increases as a person ages. This condition is also more likely to develop in people who:  Are overweight.  Are male.  Smoke or chew tobacco.  Abuse alcohol or illegal drugs.  Have taken medicines that can damage the heart, such as  chemotherapy drugs.  Have diabetes.  High blood sugar (glucose) is associated with high fat (lipid) levels in the blood.  Diabetes can also damage tiny blood vessels that carry nutrients to the heart muscle.  Have abnormal heart rhythms.  Have thyroid problems.  Have low blood counts (anemia). What are the signs or symptoms? Symptoms of this condition include:  Shortness of breath with activity, such as when climbing stairs.  Persistent cough.  Swelling of the feet, ankles, legs, or abdomen.  Unexplained weight gain.  Difficulty breathing when lying flat (orthopnea).  Waking from sleep because of the need to sit up and get more air.  Rapid heartbeat.  Fatigue and loss of energy.  Feeling light-headed, dizzy, or  close to fainting.  Loss of appetite.  Nausea.  Increased urination during the night (nocturia).  Confusion. How is this diagnosed? This condition is diagnosed based on:  Medical history, symptoms, and a physical exam.  Diagnostic tests, which may include:  Echocardiogram.  Electrocardiogram (ECG).  Chest X-ray.  Blood tests.  Exercise stress test.  Radionuclide scans.  Cardiac catheterization and angiogram. How is this treated? Treatment for this condition is aimed at managing the symptoms of heart failure. Medicines, behavioral changes, or other treatments may be necessary to treat heart failure. Medicines  These may include:  Angiotensin-converting enzyme (ACE) inhibitors. This type of medicine blocks the effects of a blood protein called angiotensin-converting enzyme. ACE inhibitors relax (dilate) the blood vessels and help to lower blood pressure.  Angiotensin receptor blockers (ARBs). This type of medicine blocks the actions of a blood protein called angiotensin. ARBs dilate the blood vessels and help to lower blood pressure.  Water pills (diuretics). Diuretics cause the kidneys to remove salt and water from the blood. The extra fluid is removed through urination, leaving a lower volume of blood that the heart has to pump.  Beta blockers. These improve heart muscle strength and they prevent the heart from beating too quickly.  Digoxin. This increases the force of the heartbeat. Healthy behavior changes  These may include:  Reaching and maintaining a healthy weight.  Stopping smoking or chewing tobacco.  Eating heart-healthy foods.  Limiting or avoiding alcohol.  Stopping use of street drugs (illegal drugs).  Physical activity. Other treatments  These may include:  Surgery to open blocked coronary arteries or repair damaged heart valves.  Placement of a biventricular pacemaker to improve heart muscle function (cardiac resynchronization therapy). This  device paces both the right ventricle and left ventricle.  Placement of a device to treat serious abnormal heart rhythms (implantable cardioverter defibrillator, or ICD).  Placement of a device to improve the pumping ability of the heart (left ventricular assist device, or LVAD).  Heart transplant. This can cure heart failure, and it is considered for certain patients who do not improve with other therapies. Follow these instructions at home: Medicines   Take over-the-counter and prescription medicines only as told by your health care provider. Medicines are important in reducing the workload of your heart, slowing the progression of heart failure, and improving your symptoms.  Do not stop taking your medicine unless your health care provider told you to do that.  Do not skip any dose of medicine.  Refill your prescriptions before you run out of medicine. You need your medicines every day. Eating and drinking    Eat heart-healthy foods. Talk with a dietitian to make an eating plan that is right for you.  Choose foods that contain no trans fat  and are low in saturated fat and cholesterol. Healthy choices include fresh or frozen fruits and vegetables, fish, lean meats, legumes, fat-free or low-fat dairy products, and whole-grain or high-fiber foods.  Limit salt (sodium) if directed by your health care provider. Sodium restriction may reduce symptoms of heart failure. Ask a dietitian to recommend heart-healthy seasonings.  Use healthy cooking methods instead of frying. Healthy methods include roasting, grilling, broiling, baking, poaching, steaming, and stir-frying.  Limit your fluid intake if directed by your health care provider. Fluid restriction may reduce symptoms of heart failure. Lifestyle   Stop smoking or using chewing tobacco. Nicotine and tobacco can damage your heart and your blood vessels. Do not use nicotine gum or patches before talking to your health care  provider.  Limit alcohol intake to no more than 1 drink per day for non-pregnant women and 2 drinks per day for men. One drink equals 12 oz of beer, 5 oz of wine, or 1 oz of hard liquor.  Drinking more than that is harmful to your heart. Tell your health care provider if you drink alcohol several times a week.  Talk with your health care provider about whether any level of alcohol use is safe for you.  If your heart has already been damaged by alcohol or you have severe heart failure, drinking alcohol should be stopped completely.  Stop use of illegal drugs.  Lose weight if directed by your health care provider. Weight loss may reduce symptoms of heart failure.  Do moderate physical activity if directed by your health care provider. People who are elderly and people with severe heart failure should consult with a health care provider for physical activity recommendations. Monitor important information   Weigh yourself every day. Keeping track of your weight daily helps you to notice excess fluid sooner.  Weigh yourself every morning after you urinate and before you eat breakfast.  Wear the same amount of clothing each time you weigh yourself.  Record your daily weight. Provide your health care provider with your weight record.  Monitor and record your blood pressure as told by your health care provider.  Check your pulse as told by your health care provider. Dealing with extreme temperatures   If the weather is extremely hot:  Avoid vigorous physical activity.  Use air conditioning or fans or seek a cooler location.  Avoid caffeine and alcohol.  Wear loose-fitting, lightweight, and light-colored clothing.  If the weather is extremely cold:  Avoid vigorous physical activity.  Layer your clothes.  Wear mittens or gloves, a hat, and a scarf when you go outside.  Avoid alcohol. General instructions   Manage other health conditions such as hypertension, diabetes,  thyroid disease, or abnormal heart rhythms as told by your health care provider.  Learn to manage stress. If you need help to do this, ask your health care provider.  Plan rest periods when fatigued.  Get ongoing education and support as needed.  Participate in or seek rehabilitation as needed to maintain or improve independence and quality of life.  Stay up to date with immunizations. Keeping current on pneumococcal and influenza immunizations is especially important to prevent respiratory infections.  Keep all follow-up visits as told by your health care provider. This is important. Contact a health care provider if:  You have a rapid weight gain.  You have increasing shortness of breath that is unusual for you.  You are unable to participate in your usual physical activities.  You tire easily.  You cough more than normal, especially with physical activity.  You have any swelling or more swelling in areas such as your hands, feet, ankles, or abdomen.  You are unable to sleep because it is hard to breathe.  You feel like your heart is beating quickly (palpitations).  You become dizzy or light-headed when you stand up. Get help right away if:  You have difficulty breathing.  You notice or your family notices a change in your awareness, such as having trouble staying awake or having difficulty with concentration.  You have pain or discomfort in your chest.  You have an episode of fainting (syncope). This information is not intended to replace advice given to you by your health care provider. Make sure you discuss any questions you have with your health care provider. Document Released: 02/22/2005 Document Revised: 10/28/2015 Document Reviewed: 09/17/2015 Elsevier Interactive Patient Education  2017 [employer].

## 2016-06-02 ENCOUNTER — Telehealth: Payer: Self-pay | Admitting: Hematology and Oncology

## 2016-06-02 NOTE — Telephone Encounter (Signed)
Per 3/20 los. - no new orders for patient

## 2016-06-03 ENCOUNTER — Encounter: Payer: Self-pay | Admitting: Cardiology

## 2016-06-03 ENCOUNTER — Ambulatory Visit (INDEPENDENT_AMBULATORY_CARE_PROVIDER_SITE_OTHER): Payer: BLUE CROSS/BLUE SHIELD | Admitting: Cardiology

## 2016-06-03 VITALS — BP 130/92 | HR 135 | Ht 74.0 in | Wt 214.4 lb

## 2016-06-03 DIAGNOSIS — Z79899 Other long term (current) drug therapy: Secondary | ICD-10-CM | POA: Diagnosis not present

## 2016-06-03 DIAGNOSIS — I4891 Unspecified atrial fibrillation: Secondary | ICD-10-CM | POA: Diagnosis not present

## 2016-06-03 MED ORDER — METOPROLOL SUCCINATE ER 200 MG PO TB24: 200.0000 mg | ORAL_TABLET | Freq: Every day | ORAL | 3 refills | Status: DC

## 2016-06-03 NOTE — Progress Notes (Signed)
06/03/2016 Larry Vasquez   1950-04-04  [id number]  Primary Physician Woody Seller, MD Primary Cardiologist: Dr. Angelena Form    Reason for Visit/CC: [name] [place] F/u for New Onset Atrial Fibrillation and New Systolic CHF  HPI:  Larry Vasquez presents to clinic today with his wife for post hospital f/u after recent admission at [facility].   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 He was sent to the ED by his oncologist on 05/25/16 for rapid heart beat. In the ED, he was found to be in new onsetatrial fibrillation with a rapid ventricular response. Duration unknown. Pt was also noted to be volume overloaded with acute CHF. CXR in the ED demonstrated cardiomegally and pulmonary vascular congestion. BNP was abnormal at 348. K, Mg, TSH and H/H were all stable and WNL. He was admitted for afib management and diuresis for CHF.   Given unknown duration of his atrial fibrillation, DCCV was not attempted in the ED. Pt was admitted to telemetry and placed on IV Cardizem for rate control. His CHA2DS2 VASc score was calculated at 3 for CHF, HTN and age 73-74. It was elected to initially treat with IV heparin, until further w/u was conducted to see if he would require invasive procedures. Pharmacy assisted with heparin dosing. His rate improved with IV Cardizem, however he remained in afib. Symptoms improved with rate control as well as with diuresis. He was treated with IV lasix and had a good diuretic response. Renal function remained stable as well as his electrolytes. A 2D echo was obtained which demonstrated reduced LVEF at 35-40% with diffuse hypokinesis. The LA was severely dilated. His LV dysfunction was felt to be likely tachy induced secondary to rapid afib. His Cardizem was discontinued and he was placed on a BB, metoprolol succinate 100 mg/day. LHC not indicated at this time. Recommendations are to  repeat a 2D echo in 1 month, now that he his rate controlled. Heparin was discontinued and he was placed on Eliquis, 5 mg BID, for anticoagulation. Plan will be for a repeat EKG in  4 weeks and will plan for DCCV if still in afib.   Today in f/u, he reports that he has done fairly well. EKG shows atrial fibrillation with a RVR of 135 bpm. BP is stable. He is asymptomatic. No dyspnea, orthopnea, PND, lower extremity edema, palpitations, chest pain, dizziness, syncope/near-syncope. He reports full medication compliance with Metroprolol as well as full compliance with Eliquis. He is tolerating his medications well.   No outpatient prescriptions have been marked as taking for the 06/03/16 encounter (Appointment) with Larry Pandy, PA-C.   Allergies  Allergen Reactions  . Corticosteroids Other (See Comments)    Muscle stiffness   Past Medical History:  Diagnosis Date  . Cataract    Left eye  . CML (chronic myelocytic leukemia) ([place])   . Leukocytosis, unspecified 10/17/2012  . Thrombocytosis ([place]) 10/17/2012  . White coat hypertension 06/06/2014   Family History  Problem Relation Age of Onset  . Arthritis Mother   . Hypertension Mother   . Diabetes Father   . Hypertension Father   . Cancer Father     bladder ca  . CAD Father     CABG  . Chronic Renal Failure Father   . Atrial fibrillation Father   . Healthy Brother   . Healthy Brother    Past Surgical History:  Procedure Laterality Date  .  CATARACT EXTRACTION Left 01/01/2013  . EYE SURGERY  2012   Detached Retina  . HERNIA REPAIR    . JOINT REPLACEMENT  2006   Total Knee Left  . TONSILLECTOMY     Social History   Social History  . Marital status: Married    Spouse name: [name]  . Number of children: [name]  . Years of education: [name]   Occupational History  . Not on file.   Social History Main Topics  . Smoking status: Never Smoker  . Smokeless tobacco: Never Used  . Alcohol use 4.2 oz/week    7 Glasses of wine per  week  . Drug use: No  . Sexual activity: Yes    Birth control/ protection: None   Other Topics Concern  . Not on file   Social History Narrative  . No narrative on file     Review of Systems: General: negative for chills, fever, night sweats or weight changes.  Cardiovascular: negative for chest pain, dyspnea on exertion, edema, orthopnea, palpitations, paroxysmal nocturnal dyspnea or shortness of breath Dermatological: negative for rash Respiratory: negative for cough or wheezing Urologic: negative for hematuria Abdominal: negative for nausea, vomiting, diarrhea, bright red blood per rectum, melena, or hematemesis Neurologic: negative for visual changes, syncope, or dizziness All other systems reviewed and are otherwise negative except as noted above.   Physical Exam:  There were no vitals taken for this visit.  General appearance: alert, cooperative and no distress Neck: no carotid bruit and no JVD Lungs: clear to auscultation bilaterally Heart: regular rate and rhythm, S1, S2 normal, no murmur, click, rub or gallop Extremities: extremities normal, atraumatic, no cyanosis or edema Pulses: 2+ and symmetric Skin: Skin color, texture, turgor normal. No rashes or lesions Neurologic: Grossly normal  EKG no preformed   ASSESSMENT AND PLAN:   1. New Onset Atrial Fibrillation:  -- Mg, TSH and H/H were all stable and WNL -- EF 35-40% by recent echo -- CHA2DS2 VASc score was calculated at 3 for CHF, HTN and age 70-74  -- Continue Eliquis 5 mg BID  -- Check repeat CBC today to ensure H/H stable -- Rate remains poorly controlled in the 130s, however he is asymptomatic. BP is stable with room to increase rate control agent.We will increase Metoprolol XL to 200 mg daily. Continue to avoid Cardizem given LV dysfunction.  --Repeat EKG in 2-3 weeks and arrange DCCV if still in Afib.  --Pt advised to notify our office if any development of symptoms.    2. New Systolic HF: Recent 2D  echo demonstrated reduced LVEF at 35-40% with diffuse hypokinesis. The LA was severely dilated. His LV dysfunction was felt to be likely tachy induced secondary to rapid afib, thus likely nonischemic. -- He is euvolemic on physical exam. No dyspnea, orthopnea/PND -- Continue Lasix -- Continue medical therapy with ARB and BB --Check BMP today to monitor renal function and K -- Recheck 2D echo once back in NSR   PLAN  f/u in 2-3 weeks for repeat EKG  Isaack Preble PA-C 06/03/2016 9:28 AM

## 2016-06-03 NOTE — Patient Instructions (Addendum)
Medication Instructions:    START TAKING METOPROLOL SUCCINATE  200 MG DAILY   If you need a refill on your cardiac medications before your next appointment, please call your pharmacy.  Labwork: CBC AND BMET    Testing/Procedures: NONE ORDERED  TODAY    Follow-Up:  WITH Larry Vasquez  IN 2 WEEKS TO DISCUSS CARDIOVERSION   Any Other Special Instructions Will Be Listed Below (If Applicable).   Electrical Cardioversion Electrical cardioversion is the delivery of a jolt of electricity to restore a normal rhythm to the heart. A rhythm that is too fast or is not regular keeps the heart from pumping well. In this procedure, sticky patches or metal paddles are placed on the chest to deliver electricity to the heart from a device. This procedure may be done in an emergency if:  There is low or no blood pressure as a result of the heart rhythm.  Normal rhythm must be restored as fast as possible to protect the brain and heart from further damage.  It may save a life. This procedure may also be done for irregular or fast heart rhythms that are not immediately life-threatening. Tell a health care provider about:  Any allergies you have.  All medicines you are taking, including vitamins, herbs, eye drops, creams, and over-the-counter medicines.  Any problems you or family members have had with anesthetic medicines.  Any blood disorders you have.  Any surgeries you have had.  Any medical conditions you have.  Whether you are pregnant or may be pregnant. What are the risks? Generally, this is a safe procedure. However, problems may occur, including:  Allergic reactions to medicines.  A blood clot that breaks free and travels to other parts of your body.  The possible return of an abnormal heart rhythm within hours or days after the procedure.  Your heart stopping (cardiac arrest). This is rare. What happens before the procedure? Medicines   Your health care provider may  have you start taking:  Blood-thinning medicines (anticoagulants) so your blood does not clot as easily.  Medicines may be given to help stabilize your heart rate and rhythm.  Ask your health care provider about changing or stopping your regular medicines. This is especially important if you are taking diabetes medicines or blood thinners. General instructions   Plan to have someone take you home from the hospital or clinic.  If you will be going home right after the procedure, plan to have someone with you for 24 hours.  Follow instructions from your health care provider about eating or drinking restrictions. What happens during the procedure?  To lower your risk of infection:  Your health care team will wash or sanitize their hands.  Your skin will be washed with soap.  An IV tube will be inserted into one of your veins.  You will be given a medicine to help you relax (sedative).  Sticky patches (electrodes) or metal paddles may be placed on your chest.  An electrical shock will be delivered. The procedure may vary among health care providers and hospitals. What happens after the procedure?  Your blood pressure, heart rate, breathing rate, and blood oxygen level will be monitored until the medicines you were given have worn off.  Do not drive for 24 hours if you were given a sedative.  Your heart rhythm will be watched to make sure it does not change. This information is not intended to replace advice given to you by your health care provider. Make sure  you discuss any questions you have with your health care provider. Document Released: 02/12/2002 Document Revised: 10/22/2015 Document Reviewed: 08/29/2015 Elsevier Interactive Patient Education  2017 [employer].                                                                                                                                                Heart Failure Heart failure means your heart has trouble  pumping blood. This makes it hard for your body to work well. Heart failure is usually a long-term (chronic) condition. You must take good care of yourself and follow your doctor's treatment plan. Follow these instructions at home:  Take your heart medicine as told by your doctor.  Do not stop taking medicine unless your doctor tells you to.  Do not skip any dose of medicine.  Refill your medicines before they run out.  Take other medicines only as told by your doctor or pharmacist.  Stay active if told by your doctor. The elderly and people with severe heart failure should talk with a doctor about physical activity.  Eat heart-healthy foods. Choose foods that are without trans fat and are low in saturated fat, cholesterol, and salt (sodium). This includes fresh or frozen fruits and vegetables, fish, lean meats, fat-free or low-fat dairy foods, whole grains, and high-fiber foods. Lentils and dried peas and beans (legumes) are also good choices.  Limit salt if told by your doctor.  Cook in a healthy way. Roast, grill, broil, bake, poach, steam, or stir-fry foods.  Limit fluids as told by your doctor.  Weigh yourself every morning. Do this after you pee (urinate) and before you eat breakfast. Write down your weight to give to your doctor.  Take your blood pressure and write it down if your doctor tells you to.  Ask your doctor how to check your pulse. Check your pulse as told.  Lose weight if told by your doctor.  Stop smoking or chewing tobacco. Do not use gum or patches that help you quit without your doctor's approval.  Schedule and go to doctor visits as told.  Nonpregnant women should have no more than 1 drink a day. Men should have no more than 2 drinks a day. Talk to your doctor about drinking alcohol.  Stop illegal drug use.  Stay current with shots (immunizations).  Manage your health conditions as told by your doctor.  Learn to manage your stress.  Rest when you  are tired.  If it is really hot outside:  Avoid intense activities.  Use air conditioning or fans, or get in a cooler place.  Avoid caffeine and alcohol.  Wear loose-fitting, lightweight, and light-colored clothing.  If it is really cold outside:  Avoid intense activities.  Layer your clothing.  Wear mittens or gloves, a hat, and a scarf when going outside.  Avoid alcohol.  Learn about  heart failure and get support as needed.  Get help to maintain or improve your quality of life and your ability to care for yourself as needed. Contact a doctor if:  You gain weight quickly.  You are more short of breath than usual.  You cannot do your normal activities.  You tire easily.  You cough more than normal, especially with activity.  You have any or more puffiness (swelling) in areas such as your hands, feet, ankles, or belly (abdomen).  You cannot sleep because it is hard to breathe.  You feel like your heart is beating fast (palpitations).  You get dizzy or light-headed when you stand up. Get help right away if:  You have trouble breathing.  There is a change in mental status, such as becoming less alert or not being able to focus.  You have chest pain or discomfort.  You faint. This information is not intended to replace advice given to you by your health care provider. Make sure you discuss any questions you have with your health care provider. Document Released: 12/02/2007 Document Revised: 07/31/2015 Document Reviewed: 04/10/2012 Elsevier Interactive Patient Education  2017 [employer].

## 2016-06-04 ENCOUNTER — Telehealth: Payer: Self-pay | Admitting: *Deleted

## 2016-06-04 LAB — CBC
HEMOGLOBIN: 14.7 g/dL (ref 13.0–17.7)
Hematocrit: 42.6 % (ref 37.5–51.0)
MCH: 30.7 pg (ref 26.6–33.0)
MCHC: 34.5 g/dL (ref 31.5–35.7)
MCV: 89 fL (ref 79–97)
Platelets: 239 10*3/uL (ref 150–379)
RBC: 4.79 x10E6/uL (ref 4.14–5.80)
RDW: 13.8 % (ref 12.3–15.4)
WBC: 3.6 10*3/uL (ref 3.4–10.8)

## 2016-06-04 LAB — BASIC METABOLIC PANEL
BUN / CREAT RATIO: 22 (ref 10–24)
BUN: 22 mg/dL (ref 8–27)
CO2: 23 mmol/L (ref 18–29)
Calcium: 9.4 mg/dL (ref 8.6–10.2)
Chloride: 99 mmol/L (ref 96–106)
Creatinine, Ser: 1.02 mg/dL (ref 0.76–1.27)
GFR calc non Af Amer: 76 mL/min/{1.73_m2} (ref >59)
GFR, EST AFRICAN AMERICAN: 88 mL/min/{1.73_m2} (ref >59)
GLUCOSE: 99 mg/dL (ref 65–99)
Potassium: 4.7 mmol/L (ref 3.5–5.2)
Sodium: 140 mmol/L (ref 134–144)

## 2016-06-04 NOTE — Telephone Encounter (Signed)
Notified of message below. Says Dr Alvy Bimler told him to hold Sprycel when he was in ED. Will restart today

## 2016-06-04 NOTE — Telephone Encounter (Signed)
Pt wants to know if he should continue to "stay off sprycel or restart it ?"

## 2016-06-04 NOTE — Telephone Encounter (Signed)
I do not recommend him to stop and start The dose was recently reduced already Stop & Start may promote resistance to Sprycel I do not believe that was the cause of his CHF

## 2016-06-23 ENCOUNTER — Encounter: Payer: Self-pay | Admitting: Cardiology

## 2016-06-23 ENCOUNTER — Other Ambulatory Visit: Payer: Self-pay | Admitting: Cardiology

## 2016-06-23 ENCOUNTER — Encounter: Payer: Self-pay | Admitting: *Deleted

## 2016-06-23 ENCOUNTER — Ambulatory Visit (INDEPENDENT_AMBULATORY_CARE_PROVIDER_SITE_OTHER): Payer: BLUE CROSS/BLUE SHIELD | Admitting: Cardiology

## 2016-06-23 VITALS — BP 140/90 | HR 100 | Ht 74.0 in | Wt 214.0 lb

## 2016-06-23 DIAGNOSIS — I481 Persistent atrial fibrillation: Secondary | ICD-10-CM

## 2016-06-23 DIAGNOSIS — I4819 Other persistent atrial fibrillation: Secondary | ICD-10-CM

## 2016-06-23 DIAGNOSIS — I519 Heart disease, unspecified: Secondary | ICD-10-CM

## 2016-06-23 NOTE — Patient Instructions (Addendum)
Medication Instructions:   Your physician recommends that you continue on your current medications as directed. Please refer to the Current Medication list given to you today.    If you need a refill on your cardiac medications before your next appointment, please call your pharmacy.  Labwork:  NONE ORDERED  TODAY    Testing/Procedures:  SEE LETTER FOR CARDIOVERSION     Follow-Up: AFTER PROCEDURE WILL BE SCHEDULED FOR YOU    Any Other Special Instructions Will Be Listed Below (If Applicable).

## 2016-06-23 NOTE — Progress Notes (Addendum)
06/23/2016 Larry Vasquez   [birth year]  [id number]  Primary Physician Larry Seller, MD Primary Cardiologist: Dr. Angelena Vasquez   Reason for Visit/CC: F/u for Atrial Fibrillation and Systolic HF  HPI:  Larry Vasquez presents back to clinic today for f/u for atrial fibrillation and systolic HF, both of which are new diagnoses.   He was recently admitted to [facility] 06/04/16. 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 He was subsequently referred to the [facility] ED where CXR and BNP were consisted with acute CHF. EKG also confirmed new onset atrial flutter w/ RVR. He was admitted by [facility] and treated with IV Lasix as well as IV Cardizem for rate control. Rate improved as well as his dyspnea. 2D echo showed reduced LVEF at 35-40% with diffuse hypokinesis. He denied CP. It was felt that his LV dysfunction was likely tachy mediated from his rapid atrial flutter. Medical therapy was elected. No ischemic eval pursued. He was transitioned off of IV Cardizem and started on a BB, metoprolol, as well as Eliquis for a/c. Plan is for elective DCCV, after 4 weeks of uninterrupted anticoagulation, if no spontaneous conversion to NSR.   He presents back to clinic today for f/u and for repeat EKG. This shows continued atrial fibrillation w/ RVR. BP is stable. He remains largely asymptomatic, except for occasional fatigue. He reports full, uninterrupted, compliance with Eliquis BID for the last 4 weeks.  We discussed indication and procedural details of DCCV and he is willing to proceed.    Current Meds  Medication Sig  . apixaban (ELIQUIS) 5 MG TABS tablet Take 1 tablet (5 mg total) by mouth 2 (two) times daily.  . APPLE CIDER VINEGAR PO Take 15 mLs by mouth every other day.  . cholecalciferol (VITAMIN D) 1000 UNITS tablet Take 2,000 Units by mouth daily.   . Coenzyme Q10 (CO Q-10) 100 MG CAPS Take 100 mg by mouth  daily.  . dasatinib (SPRYCEL) 50 MG tablet Take 1 tablet (50 mg total) by mouth daily.  . Digestive Enzymes CAPS Take 1 capsule by mouth 2 (two) times daily.   . fish oil-omega-3 fatty acids 1000 MG capsule Take 1 g by mouth daily.  . furosemide (LASIX) 20 MG tablet Take 1 tablet (20 mg total) by mouth daily.  Marland Kitchen losartan (COZAAR) 25 MG tablet Take 1 tablet (25 mg total) by mouth daily.  . magnesium oxide (MAG-OX) 400 MG tablet Take 400 mg by mouth every morning.  . metoprolol succinate (TOPROL-XL) 200 MG 24 hr tablet Take 1 tablet (200 mg total) by mouth daily. Take with or immediately following a meal.  . MILK THISTLE PO Take by mouth daily.  Marland Kitchen OVER THE COUNTER MEDICATION ASEA dietary supplement: Drink 1 ounce by mouth once a day  . TURMERIC CURCUMIN PO Take 3 capsules by mouth daily.   . vitamin B-12 (CYANOCOBALAMIN) 1000 MCG tablet Take 1,000 mcg by mouth daily.  . [DISCONTINUED] DANDELION PO Take 1 capsule by mouth daily. supplement   . [DISCONTINUED] GARLIC PO Take 1 capsule by mouth daily.   Allergies  Allergen Reactions  . Corticosteroids Other (See Comments)    Muscle stiffness   Past Medical History:  Diagnosis Date  . Cataract    Left eye  . CML (chronic myelocytic leukemia) ([place])   . Leukocytosis, unspecified 10/17/2012  . Thrombocytosis ([place]) 10/17/2012  . White coat hypertension 06/06/2014  Family History  Problem Relation Age of Onset  . Arthritis Mother   . Hypertension Mother   . Diabetes Father   . Hypertension Father   . Cancer Father     bladder ca  . CAD Father     CABG  . Chronic Renal Failure Father   . Atrial fibrillation Father   . Healthy Brother   . Healthy Brother    Past Surgical History:  Procedure Laterality Date  . CATARACT EXTRACTION Left 01/01/2013  . EYE SURGERY  2012   Detached Retina  . HERNIA REPAIR    . JOINT REPLACEMENT  2006   Total Knee Left  . TONSILLECTOMY     Social History   Social History  . Marital status: Married      Spouse name: [name]  . Number of children: [name]  . Years of education: [name]   Occupational History  . Not on file.   Social History Main Topics  . Smoking status: Never Smoker  . Smokeless tobacco: Never Used  . Alcohol use 4.2 oz/week    7 Glasses of wine per week  . Drug use: No  . Sexual activity: Yes    Birth control/ protection: None   Other Topics Concern  . Not on file   Social History Narrative  . No narrative on file     Review of Systems: General: negative for chills, fever, night sweats or weight changes.  Cardiovascular: negative for chest pain, dyspnea on exertion, edema, orthopnea, palpitations, paroxysmal nocturnal dyspnea or shortness of breath Dermatological: negative for rash Respiratory: negative for cough or wheezing Urologic: negative for hematuria Abdominal: negative for nausea, vomiting, diarrhea, bright red blood per rectum, melena, or hematemesis Neurologic: negative for visual changes, syncope, or dizziness All other systems reviewed and are otherwise negative except as noted above.   Physical Exam:  Blood pressure 140/90, pulse 100, height 6\' 2"  (1.88 m), weight 214 lb (97.1 kg), SpO2 97 %.  General appearance: alert, cooperative and no distress Neck: no carotid bruit and no JVD Lungs: clear to auscultation bilaterally and normal percussion bilaterally Heart: irregularly irregular rhythm and tachy rate Extremities: extremities normal, atraumatic, no cyanosis or edema Pulses: 2+ and symmetric Skin: Skin color, texture, turgor normal. No rashes or lesions Neurologic: Grossly normal  EKG Atrial Fibrillation w/ RVR -- personally reviewed   ASSESSMENT AND PLAN:   1. Persistent Atrial Fibrillation/ Flutter: Pt has remained in persistent atrial fibrillation/ flutter for at least the past 4 weeks. He has failed to convert spontaneously with rate control strategy. He has been anticoagulated with Eliquis and reports full, uninterrupted, compliance for  the last 4 weeks. We discussed indication, procedural details and risk of DCCV and he is willing to proceed. We will arrange at [facility].    2. New Systolic HF: Recent 2D echo demonstrated reduced LVEF at 35-40% with diffuse hypokinesis. The LA was severely dilated. His LV dysfunction was felt to be likely tachy inducedsecondary to rapid afib, thus likely nonischemic. -- He is euvolemic on physical exam. No dyspnea, orthopnea/PND -- Continue Lasix -- Continue medical therapy with ARB and BB -- Recheck 2D echo once back in NSR  PLAN  Elective DCCV at [facility] for persistent atrial fibrillation/flutter. F/u 1-2 weeks after procedure.    Roselle Norton Ladoris Gene, MHS [facility] 06/23/2016 3:20 PM

## 2016-06-24 ENCOUNTER — Encounter (HOSPITAL_COMMUNITY): Payer: Self-pay | Admitting: Anesthesiology

## 2016-06-24 ENCOUNTER — Encounter (HOSPITAL_COMMUNITY)
Admission: RE | Disposition: A | Discharge status: Home / Self Care | Payer: Self-pay | Source: Ambulatory Visit | Attending: Cardiology

## 2016-06-24 ENCOUNTER — Ambulatory Visit (HOSPITAL_COMMUNITY)
Admission: RE | Admit: 2016-06-24 | Discharge: 2016-06-24 | Disposition: A | Discharge status: Home / Self Care | Payer: BLUE CROSS/BLUE SHIELD | Source: Ambulatory Visit | Attending: Cardiology | Admitting: Cardiology

## 2016-06-24 ENCOUNTER — Ambulatory Visit (HOSPITAL_COMMUNITY): Payer: BLUE CROSS/BLUE SHIELD | Admitting: Anesthesiology

## 2016-06-24 DIAGNOSIS — Z8249 Family history of ischemic heart disease and other diseases of the circulatory system: Secondary | ICD-10-CM | POA: Insufficient documentation

## 2016-06-24 DIAGNOSIS — I4892 Unspecified atrial flutter: Secondary | ICD-10-CM | POA: Insufficient documentation

## 2016-06-24 DIAGNOSIS — Z7901 Long term (current) use of anticoagulants: Secondary | ICD-10-CM | POA: Diagnosis not present

## 2016-06-24 DIAGNOSIS — I481 Persistent atrial fibrillation: Secondary | ICD-10-CM | POA: Diagnosis present

## 2016-06-24 DIAGNOSIS — I5020 Unspecified systolic (congestive) heart failure: Secondary | ICD-10-CM | POA: Insufficient documentation

## 2016-06-24 DIAGNOSIS — I480 Paroxysmal atrial fibrillation: Secondary | ICD-10-CM | POA: Diagnosis not present

## 2016-06-24 DIAGNOSIS — C9210 Chronic myeloid leukemia, BCR/ABL-positive, not having achieved remission: Secondary | ICD-10-CM | POA: Diagnosis not present

## 2016-06-24 DIAGNOSIS — I110 Hypertensive heart disease with heart failure: Secondary | ICD-10-CM | POA: Insufficient documentation

## 2016-06-24 HISTORY — PX: CARDIOVERSION: SHX1299

## 2016-06-24 LAB — POCT I-STAT 4, (NA,K, GLUC, HGB,HCT)
Glucose, Bld: 95 mg/dL (ref 65–99)
HCT: 42 % (ref 39.0–52.0)
HEMOGLOBIN: 14.3 g/dL (ref 13.0–17.0)
POTASSIUM: 3.9 mmol/L (ref 3.5–5.1)
Sodium: 140 mmol/L (ref 135–145)

## 2016-06-24 SURGERY — CARDIOVERSION
Anesthesia: General

## 2016-06-24 MED ORDER — SODIUM CHLORIDE 0.9 % IV SOLN
INTRAVENOUS | Status: DC
  Administered 2016-06-24: 500 mL via INTRAVENOUS

## 2016-06-24 MED ORDER — PROPOFOL 10 MG/ML IV BOLUS
INTRAVENOUS | Status: DC | PRN
  Administered 2016-06-24: 40 mg via INTRAVENOUS
  Administered 2016-06-24: 60 mg via INTRAVENOUS

## 2016-06-24 MED ORDER — LIDOCAINE HCL (CARDIAC) 20 MG/ML IV SOLN
INTRAVENOUS | Status: DC | PRN
  Administered 2016-06-24: 50 mg via INTRAVENOUS

## 2016-06-24 NOTE — CV Procedure (Signed)
    Electrical Cardioversion Procedure Note Ragnar Waas [id number] [birth year]  Procedure: Electrical Cardioversion Indications:  Atrial Fibrillation  Time Out: Verified patient identification, verified procedure,medications/allergies/relevent history reviewed, required imaging and test results available.  Performed  Procedure Details  The patient was NPO after midnight. Anesthesia was administered at the beside  by Dr.Foster with propofol.  Cardioversion was performed with synchronized biphasic defibrillation via AP pads with 120, 150, 200 joules.  3 attempt(s) were performed.  The patient converted to normal sinus rhythm briefly after the second shock then stayed in NSR after the 3rd shock at [place] with downward compression of left pectoral region with towel. The patient tolerated the procedure well   IMPRESSION:  Successful cardioversion of atrial fibrillation.     Candee Furbish 06/24/2016, 2:08 PM

## 2016-06-24 NOTE — H&P (View-Only) (Signed)
06/23/2016 Larry Vasquez   07-17-1950  [id number]  Primary Physician Woody Seller, MD Primary Cardiologist: Dr. Angelena Form   Reason for Visit/CC: F/u for Atrial Fibrillation and Systolic HF  HPI:  Larry Vasquez presents back to clinic today for f/u for atrial fibrillation and systolic HF, both of which are new diagnoses.   He was recently admitted to [facility] 06/04/16. 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 He was subsequently referred to the [facility] ED where CXR and BNP were consisted with acute CHF. EKG also confirmed new onset atrial flutter w/ RVR. He was admitted by [facility] and treated with IV Lasix as well as IV Cardizem for rate control. Rate improved as well as his dyspnea. 2D echo showed reduced LVEF at 35-40% with diffuse hypokinesis. He denied CP. It was felt that his LV dysfunction was likely tachy mediated from his rapid atrial flutter. Medical therapy was elected. No ischemic eval pursued. He was transitioned off of IV Cardizem and started on a BB, metoprolol, as well as Eliquis for a/c. Plan is for elective DCCV, after 4 weeks of uninterrupted anticoagulation, if no spontaneous conversion to NSR.   He presents back to clinic today for f/u and for repeat EKG. This shows continued atrial fibrillation w/ RVR. BP is stable. He remains largely asymptomatic, except for occasional fatigue. He reports full, uninterrupted, compliance with Eliquis BID for the last 4 weeks.  We discussed indication and procedural details of DCCV and he is willing to proceed.    Current Meds  Medication Sig  . apixaban (ELIQUIS) 5 MG TABS tablet Take 1 tablet (5 mg total) by mouth 2 (two) times daily.  . APPLE CIDER VINEGAR PO Take 15 mLs by mouth every other day.  . cholecalciferol (VITAMIN D) 1000 UNITS tablet Take 2,000 Units by mouth daily.   . Coenzyme Q10 (CO Q-10) 100 MG CAPS Take 100 mg by mouth  daily.  . dasatinib (SPRYCEL) 50 MG tablet Take 1 tablet (50 mg total) by mouth daily.  . Digestive Enzymes CAPS Take 1 capsule by mouth 2 (two) times daily.   . fish oil-omega-3 fatty acids 1000 MG capsule Take 1 g by mouth daily.  . furosemide (LASIX) 20 MG tablet Take 1 tablet (20 mg total) by mouth daily.  Marland Kitchen losartan (COZAAR) 25 MG tablet Take 1 tablet (25 mg total) by mouth daily.  . magnesium oxide (MAG-OX) 400 MG tablet Take 400 mg by mouth every morning.  . metoprolol succinate (TOPROL-XL) 200 MG 24 hr tablet Take 1 tablet (200 mg total) by mouth daily. Take with or immediately following a meal.  . MILK THISTLE PO Take by mouth daily.  Marland Kitchen OVER THE COUNTER MEDICATION ASEA dietary supplement: Drink 1 ounce by mouth once a day  . TURMERIC CURCUMIN PO Take 3 capsules by mouth daily.   . vitamin B-12 (CYANOCOBALAMIN) 1000 MCG tablet Take 1,000 mcg by mouth daily.  . [DISCONTINUED] DANDELION PO Take 1 capsule by mouth daily. supplement   . [DISCONTINUED] GARLIC PO Take 1 capsule by mouth daily.   Allergies  Allergen Reactions  . Corticosteroids Other (See Comments)    Muscle stiffness   Past Medical History:  Diagnosis Date  . Cataract    Left eye  . CML (chronic myelocytic leukemia) ([place])   . Leukocytosis, unspecified 10/17/2012  . Thrombocytosis ([place]) 10/17/2012  . White coat hypertension 06/06/2014  Family History  Problem Relation Age of Onset  . Arthritis Mother   . Hypertension Mother   . Diabetes Father   . Hypertension Father   . Cancer Father     bladder ca  . CAD Father     CABG  . Chronic Renal Failure Father   . Atrial fibrillation Father   . Healthy Brother   . Healthy Brother    Past Surgical History:  Procedure Laterality Date  . CATARACT EXTRACTION Left 01/01/2013  . EYE SURGERY  2012   Detached Retina  . HERNIA REPAIR    . JOINT REPLACEMENT  2006   Total Knee Left  . TONSILLECTOMY     Social History   Social History  . Marital status: Married      Spouse name: [name]  . Number of children: [name]  . Years of education: [name]   Occupational History  . Not on file.   Social History Main Topics  . Smoking status: Never Smoker  . Smokeless tobacco: Never Used  . Alcohol use 4.2 oz/week    7 Glasses of wine per week  . Drug use: No  . Sexual activity: Yes    Birth control/ protection: None   Other Topics Concern  . Not on file   Social History Narrative  . No narrative on file     Review of Systems: General: negative for chills, fever, night sweats or weight changes.  Cardiovascular: negative for chest pain, dyspnea on exertion, edema, orthopnea, palpitations, paroxysmal nocturnal dyspnea or shortness of breath Dermatological: negative for rash Respiratory: negative for cough or wheezing Urologic: negative for hematuria Abdominal: negative for nausea, vomiting, diarrhea, bright red blood per rectum, melena, or hematemesis Neurologic: negative for visual changes, syncope, or dizziness All other systems reviewed and are otherwise negative except as noted above.   Physical Exam:  Blood pressure 140/90, pulse 100, height 6\' 2"  (1.88 m), weight 214 lb (97.1 kg), SpO2 97 %.  General appearance: alert, cooperative and no distress Neck: no carotid bruit and no JVD Lungs: clear to auscultation bilaterally and normal percussion bilaterally Heart: irregularly irregular rhythm and tachy rate Extremities: extremities normal, atraumatic, no cyanosis or edema Pulses: 2+ and symmetric Skin: Skin color, texture, turgor normal. No rashes or lesions Neurologic: Grossly normal  EKG Atrial Fibrillation w/ RVR -- personally reviewed   ASSESSMENT AND PLAN:   1. Persistent Atrial Fibrillation/ Flutter: Pt has remained in persistent atrial fibrillation/ flutter for at least the past 4 weeks. He has failed to convert spontaneously with rate control strategy. He has been anticoagulated with Eliquis and reports full, uninterrupted, compliance for  the last 4 weeks. We discussed indication, procedural details and risk of DCCV and he is willing to proceed. We will arrange at [facility].    2. New Systolic HF: Recent 2D echo demonstrated reduced LVEF at 35-40% with diffuse hypokinesis. The LA was severely dilated. His LV dysfunction was felt to be likely tachy inducedsecondary to rapid afib, thus likely nonischemic. -- He is euvolemic on physical exam. No dyspnea, orthopnea/PND -- Continue Lasix -- Continue medical therapy with ARB and BB -- Recheck 2D echo once back in NSR  PLAN  Elective DCCV at [facility] for persistent atrial fibrillation/flutter. F/u 1-2 weeks after procedure.    Jeremaih Klima Ladoris Gene, MHS [facility] 06/23/2016 3:20 PM

## 2016-06-24 NOTE — Anesthesia Postprocedure Evaluation (Signed)
Anesthesia Post Note  Patient: Larry Vasquez  Procedure(s) Performed: Procedure(s) (LRB): CARDIOVERSION ([name])  Patient location during evaluation: PACU Anesthesia Type: General Level of consciousness: oriented and awake and alert Pain management: pain level controlled Vital Signs Assessment: post-procedure vital signs reviewed and stable Respiratory status: spontaneous breathing, nonlabored ventilation and respiratory function stable Cardiovascular status: blood pressure returned to baseline and stable Postop Assessment: no signs of nausea or vomiting Anesthetic complications: no       Last Vitals:  Vitals:   06/24/16 1305 06/24/16 1406  BP: (!) 144/81 119/70  Pulse: (!) 114 64  Resp: 19 17  Temp: 36.6 C 36.7 C    Last Pain:  Vitals:   06/24/16 1406  TempSrc: Oral                 Zoe Creasman A.

## 2016-06-24 NOTE — Discharge Instructions (Signed)
Electrical Cardioversion, Care After °This sheet gives you information about how to care for yourself after your procedure. Your health care provider may also give you more specific instructions. If you have problems or questions, contact your health care provider. °What can I expect after the procedure? °After the procedure, it is common to have: °· Some redness on the skin where the shocks were given. °Follow these instructions at home: °· Do not drive for 24 hours if you were given a medicine to help you relax (sedative). °· Take over-the-counter and prescription medicines only as told by your health care provider. °· Ask your health care provider how to check your pulse. Check it often. °· Rest for 48 hours after the procedure or as told by your health care provider. °· Avoid or limit your caffeine use as told by your health care provider. °Contact a health care provider if: °· You feel like your heart is beating too quickly or your pulse is not regular. °· You have a serious muscle cramp that does not go away. °Get help right away if: °· You have discomfort in your chest. °· You are dizzy or you feel faint. °· You have trouble breathing or you are short of breath. °· Your speech is slurred. °· You have trouble moving an arm or leg on one side of your body. °· Your fingers or toes turn cold or blue. °This information is not intended to replace advice given to you by your health care provider. Make sure you discuss any questions you have with your health care provider. °Document Released: 12/13/2012 Document Revised: 09/26/2015 Document Reviewed: 08/29/2015 °Elsevier Interactive Patient Education © 2017 Elsevier Inc. ° °

## 2016-06-24 NOTE — Transfer of Care (Signed)
Immediate Anesthesia Transfer of Care Note  Patient: Larry Vasquez  Procedure(s) Performed: Procedure(s): CARDIOVERSION ([name])  Patient Location: Endoscopy Unit  Anesthesia Type:General  Level of Consciousness: awake, oriented and patient cooperative  Airway & Oxygen Therapy: Patient Spontanous Breathing and Patient connected to nasal cannula oxygen  Post-op Assessment: Report given to RN and Post -op Vital signs reviewed and stable  Post vital signs: Reviewed  Last Vitals:  Vitals:   06/24/16 1305 06/24/16 1406  BP: (!) 144/81 119/70  Pulse: (!) 114 64  Resp: 19 17  Temp: 36.6 C     Last Pain:  Vitals:   06/24/16 1406  TempSrc: Oral         Complications: No apparent anesthesia complications

## 2016-06-24 NOTE — Anesthesia Preprocedure Evaluation (Addendum)
Anesthesia Evaluation  Patient identified by MRN, date of birth, ID band Patient awake    Reviewed: Allergy & Precautions, NPO status , Patient's Chart, lab work & pertinent test results, reviewed documented beta blocker date and time   Airway Mallampati: I       Dental no notable dental hx. (+) Teeth Intact, Dental Advisory Given   Pulmonary neg pulmonary ROS,    Pulmonary exam normal breath sounds clear to auscultation       Cardiovascular hypertension, Pt. on medications and Pt. on home beta blockers +CHF  Normal cardiovascular exam Rhythm:Regular Rate:Normal     Neuro/Psych negative neurological ROS  negative psych ROS   GI/Hepatic negative GI ROS, Neg liver ROS,   Endo/Other  negative endocrine ROS  Renal/GU negative Renal ROS  negative genitourinary   Musculoskeletal negative musculoskeletal ROS (+)   Abdominal   Peds  Hematology  (+) anemia , Hx/o CML On Apixiban- last dose this am   Anesthesia Other Findings   Reproductive/Obstetrics                            Anesthesia Physical Anesthesia Plan  ASA: III  Anesthesia Plan: General   Post-op Pain Management:    Induction: Intravenous  Airway Management Planned: Natural Airway and Mask  Additional Equipment:   Intra-op Plan:   Post-operative Plan:   Informed Consent: I have reviewed the patients History and Physical, chart, labs and discussed the procedure including the risks, benefits and alternatives for the proposed anesthesia with the patient or authorized representative who has indicated his/her understanding and acceptance.   Dental advisory given  Plan Discussed with: CRNA, Anesthesiologist and Surgeon  Anesthesia Plan Comments:         Anesthesia Quick Evaluation

## 2016-06-24 NOTE — Anesthesia Procedure Notes (Signed)
Date/Time: 06/24/2016 1:55 PM Performed by: Jenne Campus Pre-anesthesia Checklist: Patient identified, Emergency Drugs available, Suction available, Patient being monitored and Timeout performed Patient Re-evaluated:Patient Re-evaluated prior to inductionOxygen Delivery Method: Ambu bag

## 2016-06-24 NOTE — Interval H&P Note (Signed)
History and Physical Interval Note:  06/24/2016 1:46 PM  Larry Vasquez  has presented today for surgery, with the diagnosis of afib/flutter  The various methods of treatment have been discussed with the patient and family. After consideration of risks, benefits and other options for treatment, the patient has consented to  Procedure(s): CARDIOVERSION ([name]) as a surgical intervention .  The patient's history has been reviewed, patient examined, no change in status, stable for surgery.  I have reviewed the patient's chart and labs.  Questions were answered to the patient's satisfaction.     [employer]

## 2016-06-25 NOTE — Addendum Note (Signed)
Addended by: Claude Manges on: 06/25/2016 11:30 AM   Modules accepted: Orders

## 2016-07-13 ENCOUNTER — Encounter: Payer: Self-pay | Admitting: Cardiology

## 2016-07-14 ENCOUNTER — Encounter: Payer: Self-pay | Admitting: Cardiology

## 2016-07-14 ENCOUNTER — Encounter: Payer: Self-pay | Admitting: *Deleted

## 2016-07-14 ENCOUNTER — Ambulatory Visit (INDEPENDENT_AMBULATORY_CARE_PROVIDER_SITE_OTHER): Payer: BLUE CROSS/BLUE SHIELD | Admitting: Cardiology

## 2016-07-14 VITALS — BP 124/68 | HR 102 | Ht 74.0 in | Wt 215.0 lb

## 2016-07-14 DIAGNOSIS — I4819 Other persistent atrial fibrillation: Secondary | ICD-10-CM

## 2016-07-14 DIAGNOSIS — I481 Persistent atrial fibrillation: Secondary | ICD-10-CM

## 2016-07-14 DIAGNOSIS — I4891 Unspecified atrial fibrillation: Secondary | ICD-10-CM | POA: Diagnosis not present

## 2016-07-14 MED ORDER — AMIODARONE HCL 200 MG PO TABS: 200.0000 mg | ORAL_TABLET | Freq: Two times a day (BID) | ORAL | 0 refills | Status: DC

## 2016-07-14 NOTE — Patient Instructions (Addendum)
Medication Instructions:   FOR 3 WEEKS ONLY  START AMIODARONE 200 MG TWICE A DAY   THEN RESUME TAKING AMIODARONE 200 MG ONCE A DAY   If you need a refill on your cardiac medications before your next appointment, please call your pharmacy.  Labwork: NONE ORDERED  TODAY    Testing/Procedures: NONE ORDERED  TODAY    Follow-Up:  NEXT WEEK WITH  APP ON 07-23-2016 FOR REPEAT EKG NO LATER PER [facility]    ( MAY USE [facility])       6 WEEKS WITH DR Angelena Form   Any Other Special Instructions Will Be Listed Below (If Applicable).

## 2016-07-14 NOTE — Progress Notes (Addendum)
07/14/2016 Larry Vasquez   11-Mar-1950  [id number]  Primary Physician [name], MD Primary Cardiologist: Dr. Angelena Form   Reason for Visit/CC: Persistent Atrial Fibrillation/Flutter and Systolic HF  HPI:  Larry Vasquez is a 66 y.o. male who is being seen today for persistent atrial fibrillation/flutter and systolic HF, both of which are new diagnoses.    He was recently admitted to [facility] 06/04/16. 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 He was subsequently referred to the [facility] ED where CXR and BNP were consisted with acute CHF. EKG also confirmed new onset atrial flutter w/ RVR. Thyroid function and electrolytes were normal. He was admitted by [facility] and treated with IV Lasix as well as IV Cardizem for rate control. Rate improved as well as his dyspnea. 2D echo showed reduced LVEF at 35-40% with diffuse hypokinesis. He denied CP. It was felt that his LV dysfunction was likely tachy mediated from his rapid atrial flutter. Medical therapy was elected. No ischemic eval pursued. He was transitioned off of IV Cardizem and started on a BB, metoprolol, as well as Eliquis for a/c.  Despite rate control therapy, he remained in persistent atrial fibrillation/flutter. On 06/24/16, he underwent DCCV by Dr. Marlou Porch. Per procedural report, Cardioversion was performed with synchronized biphasic defibrillation via AP pads with 120, 150, 200 joules.  3 attempt(s) were performed.  The patient converted to normal sinus rhythm briefly after the second shock then stayed in NSR after the 3rd shock at [place] with downward compression of left pectoral region with towel.   He presents back to clinic today for f/u. He is here with his wife. Unfortunately, his EKG shows that he is back in atrial fibrillation. Ventricular rate is 102 bpm. BP is stable at 124/68. He is asymptomatic. No palpitations, dyspnea, dizziness,  fatigue, CP, syncope/ near syncope. No s/s of acute HF. Volume is stable. No LEE. Lungs are CTAB. No dyspnea. He reports full compliance with metoprolol and Eliquis.   Current Meds  Medication Sig  . apixaban (ELIQUIS) 5 MG TABS tablet Take 1 tablet (5 mg total) by mouth 2 (two) times daily.  . APPLE CIDER VINEGAR PO Take 15 mLs by mouth every other day.  . cholecalciferol (VITAMIN D) 1000 UNITS tablet Take 2,000 Units by mouth daily.   . Coenzyme Q10 (CO Q-10) 100 MG CAPS Take 100 mg by mouth daily.  . dasatinib (SPRYCEL) 50 MG tablet Take 1 tablet (50 mg total) by mouth daily.  . Digestive Enzymes CAPS Take 1 capsule by mouth 2 (two) times daily.   Marland Kitchen ELIQUIS 5 MG TABS tablet TAKE 1 TABLET BY MOUTH TWICE A DAY  . fish oil-omega-3 fatty acids 1000 MG capsule Take 1 g by mouth daily.  . furosemide (LASIX) 20 MG tablet Take 1 tablet (20 mg total) by mouth daily.  Marland Kitchen losartan (COZAAR) 25 MG tablet Take 1 tablet (25 mg total) by mouth daily.  . magnesium oxide (MAG-OX) 400 MG tablet Take 400 mg by mouth every morning.  . metoprolol succinate (TOPROL-XL) 200 MG 24 hr tablet Take 1 tablet (200 mg total) by mouth daily. Take with or immediately following a meal.  . MILK THISTLE PO Take by mouth daily.  Marland Kitchen OVER THE COUNTER MEDICATION ASEA dietary supplement: Drink 1 ounce by mouth once a day  . TURMERIC CURCUMIN PO Take 3 capsules by mouth daily.   Marland Kitchen  vitamin B-12 (CYANOCOBALAMIN) 1000 MCG tablet Take 1,000 mcg by mouth daily.   Allergies  Allergen Reactions  . Corticosteroids Other (See Comments)    Muscle stiffness   Past Medical History:  Diagnosis Date  . Cataract    Left eye  . CML (chronic myelocytic leukemia) ([place])   . Leukocytosis, unspecified 10/17/2012  . Thrombocytosis ([place]) 10/17/2012  . White coat hypertension 06/06/2014   Family History  Problem Relation Age of Onset  . Arthritis Mother   . Hypertension Mother   . Diabetes Father   . Hypertension Father   . Cancer Father      bladder ca  . CAD Father     CABG  . Chronic Renal Failure Father   . Atrial fibrillation Father   . Healthy Brother   . Healthy Brother    Past Surgical History:  Procedure Laterality Date  . CARDIOVERSION [name] 06/24/2016   Procedure: CARDIOVERSION;  Surgeon: [name], MD;  Location: [place];  Service: Cardiovascular;  Laterality: [name];  . CATARACT EXTRACTION Left 01/01/2013  . EYE SURGERY  2012   Detached Retina  . HERNIA REPAIR    . JOINT REPLACEMENT  2006   Total Knee Left  . TONSILLECTOMY     Social History   Social History  . Marital status: Married    Spouse name: [name]  . Number of children: [name]  . Years of education: [name]   Occupational History  . Not on file.   Social History Main Topics  . Smoking status: Never Smoker  . Smokeless tobacco: Never Used  . Alcohol use 4.2 oz/week    7 Glasses of wine per week  . Drug use: No  . Sexual activity: Yes    Birth control/ protection: None   Other Topics Concern  . Not on file   Social History Narrative  . No narrative on file     Review of Systems: General: negative for chills, fever, night sweats or weight changes.  Cardiovascular: negative for chest pain, dyspnea on exertion, edema, orthopnea, palpitations, paroxysmal nocturnal dyspnea or shortness of breath Dermatological: negative for rash Respiratory: negative for cough or wheezing Urologic: negative for hematuria Abdominal: negative for nausea, vomiting, diarrhea, bright red blood per rectum, melena, or hematemesis Neurologic: negative for visual changes, syncope, or dizziness All other systems reviewed and are otherwise negative except as noted above.   Physical Exam:  Blood pressure 124/68, pulse (!) 102, height 6\' 2"  (1.88 m), weight 215 lb (97.5 kg).  General appearance: alert, cooperative and no distress Neck: no carotid bruit and no JVD Lungs: clear to auscultation bilaterally Heart: irregularly irregular rhythm and slightly tachy  rate Extremities: extremities normal, atraumatic, no cyanosis or edema Pulses: 2+ and symmetric Skin: Skin color, texture, turgor normal. No rashes or lesions Neurologic: Grossly normal  EKG atrial fibrillation 102 bpm, QT/QTC 310/404 ms  -- personally reviewed   ASSESSMENT AND PLAN:   1. Persistent Atrial Fibrillation/Flutter: Pt has failed rate control therapy and failed first attempt at DCCV. He remains in afib w/ controlled rate. He is asymptomatic. Fully compliant with Eliquis. I've discussed plan with Dr. Tamala Julian, DOD. Given he has reduced LVF, presumed to be tachy induced from his afib, we will need to reattempt getting him back in NSR. We will add AAD therapy with Amiodarone. Pt has been anticoagulated with Eliquis. We will start amiodarone 200 mg BID x 3 weeks, followed by 200 mg daily. He will need to return to see an  APP in 10-14 days for f/u EKG to check QT/QTc and to ensure he is not bradycardic. If no chemical conversion back to NSR, we can consider repeat DCCV in 6 weeks. If he fails these therapies, we will refer to EP clinic for consideration for Afib ablation, although he may not do well with this given severely dilated left atrium. He had recent thyroid function test and hepatic function test that were normal. F/u labs in 6-8 weeks for medication monitoring. In addition, his wife mentions that he snores at night with occasional pauses in breathing. He has had a sleep study, but he reports this was many years ago. We will need to arrange repeat sleep study in the near future.   2. New Systolic HF: [id number] echo demonstrated reduced LVEF at 35-40% with diffuse hypokinesis. The LA was severely dilated. His LV dysfunction was felt to be likely tachy inducedsecondary to rapid afib, thus likely nonischemic. -- He is euvolemic on physical exam. No dyspnea, orthopnea/PND -- Continue Lasix -- Continue medical therapy with ARB and BB -- Recheck 2D echo once back in NSR   Follow-Up in  10-14 days with APP to assess response to amiodarone and f/u EKG. F/u with Dr. Angelena Form in 6 weeks.   Meghna Hagmann Ladoris Gene, MHS [facility] 07/14/2016 4:14 PM

## 2016-07-15 ENCOUNTER — Telehealth: Payer: Self-pay | Admitting: Cardiology

## 2016-07-15 NOTE — Telephone Encounter (Signed)
Returned Software engineer, [employer] call.  Pt was prescribed Amiodarone 200 mg yesterday and pt is on Sycrel for Leukemia, and they have a level 1 interaction. Pharmacists would like to know if you still wanted pt to start Amiodarone?  Please advise!

## 2016-07-15 NOTE — Telephone Encounter (Signed)
Pt c/o medication issue:  1. Name of Medication: Amedorodone 200 2x day  2. How are you currently taking this medication (dosage and times per day)?  3. Are you having a reaction (difficulty breathing--STAT)?    4. What is your medication issue?  Pt already taking Sprycel 50mg 

## 2016-07-22 ENCOUNTER — Other Ambulatory Visit (HOSPITAL_BASED_OUTPATIENT_CLINIC_OR_DEPARTMENT_OTHER): Payer: BLUE CROSS/BLUE SHIELD

## 2016-07-22 DIAGNOSIS — C9211 Chronic myeloid leukemia, BCR/ABL-positive, in remission: Secondary | ICD-10-CM | POA: Diagnosis not present

## 2016-07-22 DIAGNOSIS — C9210 Chronic myeloid leukemia, BCR/ABL-positive, not having achieved remission: Secondary | ICD-10-CM

## 2016-07-22 LAB — COMPREHENSIVE METABOLIC PANEL
ALBUMIN: 3.7 g/dL (ref 3.5–5.0)
ALT: 62 U/L — ABNORMAL HIGH (ref 0–55)
ANION GAP: 9 meq/L (ref 3–11)
AST: 33 U/L (ref 5–34)
Alkaline Phosphatase: 70 U/L (ref 40–150)
BILIRUBIN TOTAL: 1.15 mg/dL (ref 0.20–1.20)
BUN: 16.6 mg/dL (ref 7.0–26.0)
CO2: 23 mEq/L (ref 22–29)
Calcium: 8.9 mg/dL (ref 8.4–10.4)
Chloride: 106 mEq/L (ref 98–109)
Creatinine: 1.1 mg/dL (ref 0.7–1.3)
EGFR: 68 mL/min/{1.73_m2} — AB (ref >90)
GLUCOSE: 149 mg/dL — AB (ref 70–140)
Potassium: 4.5 mEq/L (ref 3.5–5.1)
SODIUM: 138 meq/L (ref 136–145)
TOTAL PROTEIN: 6.4 g/dL (ref 6.4–8.3)

## 2016-07-22 LAB — CBC WITH DIFFERENTIAL/PLATELET
BASO%: 0.9 % (ref 0.0–2.0)
BASOS ABS: 0 10*3/uL (ref 0.0–0.1)
EOS ABS: 0.1 10*3/uL (ref 0.0–0.5)
EOS%: 2.8 % (ref 0.0–7.0)
HCT: 42.1 % (ref 38.4–49.9)
HEMOGLOBIN: 14 g/dL (ref 13.0–17.1)
LYMPH%: 34.5 % (ref 14.0–49.0)
MCH: 30.6 pg (ref 27.2–33.4)
MCHC: 33.3 g/dL (ref 32.0–36.0)
MCV: 91.8 fL (ref 79.3–98.0)
MONO#: 0.4 10*3/uL (ref 0.1–0.9)
MONO%: 11.4 % (ref 0.0–14.0)
NEUT%: 50.4 % (ref 39.0–75.0)
NEUTROS ABS: 1.7 10*3/uL (ref 1.5–6.5)
PLATELETS: 148 10*3/uL (ref 140–400)
RBC: 4.59 10*6/uL (ref 4.20–5.82)
RDW: 14.5 % (ref 11.0–14.6)
WBC: 3.5 10*3/uL — ABNORMAL LOW (ref 4.0–10.3)
lymph#: 1.2 10*3/uL (ref 0.9–3.3)

## 2016-07-23 ENCOUNTER — Other Ambulatory Visit: Payer: Self-pay | Admitting: Cardiovascular Disease

## 2016-07-23 ENCOUNTER — Ambulatory Visit: Payer: BLUE CROSS/BLUE SHIELD | Admitting: Physician Assistant

## 2016-07-23 NOTE — Telephone Encounter (Signed)
I think we should probably stop amiodarone and continue rate control with the beta blocker. Pat, Can you let him know? Thanks, chris

## 2016-07-26 NOTE — Telephone Encounter (Signed)
I spoke with pt. He reports he decided he did not want to start amiodarone so he cancelled appt for 5/18.  He reports he is feeling better and plans on keeping appt with B. Rosita Fire, [place] on 08/26/16

## 2016-07-26 NOTE — Telephone Encounter (Signed)
I was consulted with DI question on 5/10 when [facility] pharmacist first called. I advised that there is a potential for QTc elevation, however not an absolute contraindication. Do not know why none of this was documented before rx was sent in.  Pt needs an EKG 1 week after starting amiodarone if both drugs are to be used together.

## 2016-07-26 NOTE — Telephone Encounter (Signed)
I spoke with Claiborne Billings at [facility].  She reports they received call from our office last week stating it was OK to start Amiodarone. Prescription was filled and pt has picked up.  Pt cancelled 5/18 appt in our office with Rosaria Ferries, PA.  Will review with provider in office.

## 2016-07-29 ENCOUNTER — Ambulatory Visit: Payer: BLUE CROSS/BLUE SHIELD | Admitting: Hematology and Oncology

## 2016-08-03 ENCOUNTER — Telehealth: Payer: Self-pay | Admitting: Hematology and Oncology

## 2016-08-03 ENCOUNTER — Encounter: Payer: Self-pay | Admitting: Hematology and Oncology

## 2016-08-03 ENCOUNTER — Ambulatory Visit (HOSPITAL_BASED_OUTPATIENT_CLINIC_OR_DEPARTMENT_OTHER): Payer: BLUE CROSS/BLUE SHIELD | Admitting: Hematology and Oncology

## 2016-08-03 VITALS — BP 121/78 | HR 74 | Temp 97.8°F | Resp 18 | Ht 74.0 in | Wt 216.6 lb

## 2016-08-03 DIAGNOSIS — I4891 Unspecified atrial fibrillation: Secondary | ICD-10-CM

## 2016-08-03 DIAGNOSIS — D701 Agranulocytosis secondary to cancer chemotherapy: Secondary | ICD-10-CM | POA: Diagnosis not present

## 2016-08-03 DIAGNOSIS — T451X5A Adverse effect of antineoplastic and immunosuppressive drugs, initial encounter: Secondary | ICD-10-CM

## 2016-08-03 DIAGNOSIS — C9211 Chronic myeloid leukemia, BCR/ABL-positive, in remission: Secondary | ICD-10-CM | POA: Diagnosis not present

## 2016-08-03 DIAGNOSIS — C9210 Chronic myeloid leukemia, BCR/ABL-positive, not having achieved remission: Secondary | ICD-10-CM

## 2016-08-03 NOTE — Assessment & Plan Note (Signed)
This is likely due to recent treatment. The patient denies recent history of fevers, cough, chills, diarrhea or dysuria. He is asymptomatic from the leukopenia. I will observe for now.  

## 2016-08-03 NOTE — Assessment & Plan Note (Signed)
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 He will continue on reduced dose Sprycel at 50 mg daily by mouth I would not recommend further dose adjustment or stopping his treatment because of documented resistance to Gleevec.  He does not fulfill the criteria for discontinuation of TKI based on current guidelines  

## 2016-08-03 NOTE — Assessment & Plan Note (Signed)
The patient has new onset of atrial fibrillation. He has no known cardiac risk factors except for positive family history of bypass surgery in his father and his age. He denies recent chest pain. He will continue medical management as directed by cardiologist.

## 2016-08-03 NOTE — Progress Notes (Signed)
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      11/07/2012 - 08/09/2013 Chemotherapy    He was started on Gleevec 400 mg daily.      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      08/22/2013 -  Chemotherapy    He is started on dasatinib.      11/19/2013 Tumor Marker    BCR/ABL is improving      02/22/2014 Pathology Results    BCR/ABL continues to improve and the patient is moving towards molecular remission      05/27/2014 Pathology Results    BCR/ABL b2a2 & b3a2 both are 0.05%, b2a2 & b3a2 IS both are 0.028%. Patient in MMR      09/26/2014 Pathology Results    Repeat BCR/ABL was not detectable      02/28/2015 Tumor Marker    Repeat BCR/ABL was not detectable      08/28/2015 Tumor Marker    Repeat BCR/ABL was not detectable      01/06/2016 Pathology Results    Repeat BCR/ABL was not detectable      04/15/2016 Pathology Results    Repeat BCR/ABL was not detectable      07/22/2016 Pathology Results    Repeat BCR/ABL was not detectable       INTERVAL HISTORY: Please see below for problem oriented charting. He returns for further follow-up He has unsuccessful cardioversion recently He continues on medical management and anticoagulation therapy He denies recent infection The patient denies any recent signs or symptoms of bleeding such as spontaneous epistaxis, hematuria or  hematochezia.   REVIEW OF SYSTEMS:   Constitutional: Denies fevers, chills or abnormal weight loss Eyes: Denies blurriness of vision Ears, nose, mouth, throat, and face: Denies mucositis or sore throat Respiratory: Denies cough, dyspnea or wheezes Cardiovascular: Denies palpitation, chest discomfort or lower extremity swelling Gastrointestinal:  Denies nausea, heartburn or change in bowel habits Skin: Denies abnormal skin rashes Lymphatics: Denies new lymphadenopathy or easy bruising Neurological:Denies numbness, tingling or new weaknesses Behavioral/Psych: Mood is stable, no new changes  All other systems were reviewed with the patient and are negative.  I have reviewed the past medical history, past surgical history, social history and family history with the patient and they are unchanged from previous note.  ALLERGIES:  is allergic to corticosteroids.  MEDICATIONS:  Current Outpatient Prescriptions  Medication Sig Dispense Refill  . APPLE CIDER VINEGAR PO Take 15 mLs by mouth every other day.    . cholecalciferol (VITAMIN D) 1000 UNITS tablet Take 2,000 Units by mouth daily.     . Coenzyme Q10 (CO Q-10) 100 MG CAPS Take 100 mg by mouth daily.    . dasatinib (SPRYCEL) 50 MG tablet Take 1 tablet (50 mg total) by mouth daily. 90 tablet 11  . Digestive Enzymes CAPS Take 1 capsule by mouth 2 (two) times daily.     Marland Kitchen  ELIQUIS 5 MG TABS tablet TAKE 1 TABLET BY MOUTH TWICE A DAY 60 tablet 5  . fish oil-omega-3 fatty acids 1000 MG capsule Take 1 g by mouth daily.    . furosemide (LASIX) 20 MG tablet Take 1 tablet (20 mg total) by mouth daily. 30 tablet 5  . losartan (COZAAR) 25 MG tablet Take 1 tablet (25 mg total) by mouth daily. 30 tablet 5  . magnesium oxide (MAG-OX) 400 MG tablet Take 400 mg by mouth every morning.    . metoprolol succinate (TOPROL-XL) 200 MG 24 hr tablet Take 1 tablet (200 mg total) by mouth daily. Take with or immediately following a meal. 30 tablet 3  . MILK  THISTLE PO Take by mouth daily.    Marland Kitchen OVER THE COUNTER MEDICATION ASEA dietary supplement: Drink 1 ounce by mouth once a day    . TURMERIC CURCUMIN PO Take 3 capsules by mouth daily.     . vitamin B-12 (CYANOCOBALAMIN) 1000 MCG tablet Take 1,000 mcg by mouth daily.     No current facility-administered medications for this visit.     PHYSICAL EXAMINATION: ECOG PERFORMANCE STATUS: 0 - Asymptomatic  Vitals:   08/03/16 0845  BP: 121/78  Pulse: 74  Resp: 18  Temp: 97.8 F (36.6 C)   Filed Weights   08/03/16 0845  Weight: 216 lb 9.6 oz (98.2 kg)    GENERAL:alert, no distress and comfortable SKIN: skin color, texture, turgor are normal, no rashes or significant lesions EYES: normal, Conjunctiva are pink and non-injected, sclera clear OROPHARYNX:no exudate, no erythema and lips, buccal mucosa, and tongue normal  NECK: supple, thyroid normal size, non-tender, without nodularity LYMPH:  no palpable lymphadenopathy in the cervical, axillary or inguinal LUNGS: clear to auscultation and percussion with normal breathing effort HEART: Irregular rate and rhythm, no murmurs and no lower extremity edema ABDOMEN:abdomen soft, non-tender and normal bowel sounds Musculoskeletal:no cyanosis of digits and no clubbing  NEURO: alert & oriented x 3 with fluent speech, no focal motor/sensory deficits  LABORATORY DATA:  I have reviewed the data as listed    Component Value Date/Time   NA 138 07/22/2016 0856   K 4.5 07/22/2016 0856   CL 99 06/03/2016 1048   CO2 23 07/22/2016 0856   GLUCOSE 149 (H) 07/22/2016 0856   BUN 16.6 07/22/2016 0856   CREATININE 1.1 07/22/2016 0856   CALCIUM 8.9 07/22/2016 0856   PROT 6.4 07/22/2016 0856   ALBUMIN 3.7 07/22/2016 0856   AST 33 07/22/2016 0856   ALT 62 (H) 07/22/2016 0856   ALKPHOS 70 07/22/2016 0856   BILITOT 1.15 07/22/2016 0856   GFRNONAA 76 06/03/2016 1048   GFRAA 88 06/03/2016 1048    No results found for: SPEP, UPEP  Lab Results  Component  Value Date   WBC 3.5 (L) 07/22/2016   NEUTROABS 1.7 07/22/2016   HGB 14.0 07/22/2016   HCT 42.1 07/22/2016   MCV 91.8 07/22/2016   PLT 148 07/22/2016      Chemistry      Component Value Date/Time   NA 138 07/22/2016 0856   K 4.5 07/22/2016 0856   CL 99 06/03/2016 1048   CO2 23 07/22/2016 0856   BUN 16.6 07/22/2016 0856   CREATININE 1.1 07/22/2016 0856      Component Value Date/Time   CALCIUM 8.9 07/22/2016 0856   ALKPHOS 70 07/22/2016 0856   AST 33 07/22/2016 0856   ALT 62 (H) 07/22/2016 0856   BILITOT 1.15 07/22/2016 0856  ASSESSMENT & PLAN:  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HCC) Recent BCR/ABL showed he is in remission. He will continue on reduced dose Sprycel at 50 mg daily by mouth I would not recommend further dose adjustment or stopping his treatment because of documented resistance to [place].  He does not fulfill the criteria for discontinuation of TKI based on current guidelines  Atrial fibrillation, new onset [facility]) The patient has new onset of atrial fibrillation. He has no known cardiac risk factors except for positive family history of bypass surgery in his father and his age. He denies recent chest pain. He will continue medical management as directed by cardiologist.  Leukopenia due to antineoplastic chemotherapy This is likely due to recent treatment. The patient denies recent history of fevers, cough, chills, diarrhea or dysuria. He is asymptomatic from the leukopenia. I will observe for now.     Orders Placed This Encounter  Procedures  . Comprehensive metabolic panel    Standing Status:   Future    Standing Expiration Date:   09/07/2017  . CBC with Differential/Platelet    Standing Status:   Future    Standing Expiration Date:   09/07/2017  . BCR-ABL    With RT-PCR technique    Standing Status:   Future    Standing Expiration Date:   09/07/2017   All questions were answered. The patient knows to call the clinic with any problems, questions or  concerns. No barriers to learning was detected. I spent 15 minutes counseling the patient face to face. The total time spent in the appointment was 20 minutes and more than 50% was on counseling and review of test results     Heath Lark, MD 08/03/2016 11:57 AM

## 2016-08-03 NOTE — Telephone Encounter (Signed)
Gave patient AVS and calender per 5/29 LOS  

## 2016-08-09 ENCOUNTER — Telehealth: Payer: Self-pay | Admitting: Pharmacist

## 2016-08-09 NOTE — Telephone Encounter (Signed)
Oral Chemotherapy Pharmacist Encounter  Received prior authorization request for patient's Sprycel continuation [facility] PA request for completed at faxed to [phone]  [facility] will continue to follow.  Johny Drilling, PharmD, BCPS, BCOP 08/09/2016  12:27 PM [facility]cology Clinic [phone]

## 2016-08-12 NOTE — Telephone Encounter (Signed)
Oral Chemotherapy Pharmacist Encounter  Prior authorization for Sprycel approved Effective dates: 08/09/16-08/09/17 PA# [place] [id number]  Per insurance, Sprycel must be filled at a [facility].  Johny Drilling, PharmD, BCPS, BCOP 08/12/2016  12:53 PM [facility] Oncology Clinic [phone]

## 2016-08-13 ENCOUNTER — Other Ambulatory Visit: Payer: Self-pay | Admitting: *Deleted

## 2016-08-13 MED ORDER — LOSARTAN POTASSIUM 25 MG PO TABS: 25.0000 mg | ORAL_TABLET | Freq: Every day | ORAL | 3 refills | Status: DC

## 2016-08-26 ENCOUNTER — Ambulatory Visit: Payer: BLUE CROSS/BLUE SHIELD | Admitting: Cardiology

## 2016-08-30 ENCOUNTER — Ambulatory Visit (INDEPENDENT_AMBULATORY_CARE_PROVIDER_SITE_OTHER): Payer: BLUE CROSS/BLUE SHIELD | Admitting: Cardiology

## 2016-08-30 ENCOUNTER — Other Ambulatory Visit: Payer: Self-pay | Admitting: Cardiology

## 2016-08-30 ENCOUNTER — Encounter: Payer: Self-pay | Admitting: Cardiology

## 2016-08-30 VITALS — BP 130/80 | HR 70 | Ht 74.0 in | Wt 211.0 lb

## 2016-08-30 DIAGNOSIS — I481 Persistent atrial fibrillation: Secondary | ICD-10-CM

## 2016-08-30 DIAGNOSIS — I428 Other cardiomyopathies: Secondary | ICD-10-CM

## 2016-08-30 DIAGNOSIS — I483 Typical atrial flutter: Secondary | ICD-10-CM | POA: Diagnosis not present

## 2016-08-30 DIAGNOSIS — I4819 Other persistent atrial fibrillation: Secondary | ICD-10-CM

## 2016-08-30 NOTE — Patient Instructions (Signed)
Your physician recommends that you continue on your current medications as directed. Please refer to the Current Medication list given to you today.    Cardiac Ablation Cardiac ablation is a procedure to disable (ablate) a small amount of heart tissue in very specific places. The heart has many electrical connections. Sometimes these connections are abnormal and can cause the heart to beat very fast or irregularly. Ablating some of the problem areas can improve the heart rhythm or return it to normal. Ablation may be done for people who:  Have Wolff-Parkinson-White syndrome.  Have fast heart rhythms (tachycardia).  Have taken medicines for an abnormal heart rhythm (arrhythmia) that were not effective or caused side effects.  Have a high-risk heartbeat that may be life-threatening.  During the procedure, a small incision is made in the neck or the groin, and a long, thin, flexible tube (catheter) is inserted into the incision and moved to the heart. Small devices (electrodes) on the tip of the catheter will send out electrical currents. A type of X-ray (fluoroscopy) will be used to help guide the catheter and to provide images of the heart. Tell a health care provider about:  Any allergies you have.  All medicines you are taking, including vitamins, herbs, eye drops, creams, and over-the-counter medicines.  Any problems you or family members have had with anesthetic medicines.  Any blood disorders you have.  Any surgeries you have had.  Any medical conditions you have, such as kidney failure.  Whether you are pregnant or may be pregnant. What are the risks? Generally, this is a safe procedure. However, problems may occur, including:  Infection.  Bruising and bleeding at the catheter insertion site.  Bleeding into the chest, especially into the sac that surrounds the heart. This is a serious complication.  Stroke or blood clots.  Damage to other structures or  organs.  Allergic reaction to medicines or dyes.  Need for a permanent pacemaker if the normal electrical system is damaged. A pacemaker is a small computer that sends electrical signals to the heart and helps your heart beat normally.  The procedure not being fully effective. This may not be recognized until months later. Repeat ablation procedures are sometimes required.  What happens before the procedure?  Follow instructions from your health care provider about eating or drinking restrictions.  Ask your health care provider about: ? Changing or stopping your regular medicines. This is especially important if you are taking diabetes medicines or blood thinners. ? Taking medicines such as aspirin and ibuprofen. These medicines can thin your blood. Do not take these medicines before your procedure if your health care provider instructs you not to.  Plan to have someone take you home from the hospital or clinic.  If you will be going home right after the procedure, plan to have someone with you for 24 hours. What happens during the procedure?  To lower your risk of infection: ? Your health care team will wash or sanitize their hands. ? Your skin will be washed with soap. ? Hair may be removed from the incision area.  An IV tube will be inserted into one of your veins.  You will be given a medicine to help you relax (sedative).  The skin on your neck or groin will be numbed.  An incision will be made in your neck or your groin.  A needle will be inserted through the incision and into a large vein in your neck or groin.  A catheter will   be inserted into the needle and moved to your heart.  Dye may be injected through the catheter to help your surgeon see the area of the heart that needs treatment.  Electrical currents will be sent from the catheter to ablate heart tissue in desired areas. There are three types of energy that may be used to ablate heart tissue: ? Heat  (radiofrequency energy). ? Laser energy. ? Extreme cold (cryoablation).  When the necessary tissue has been ablated, the catheter will be removed.  Pressure will be held on the catheter insertion area to prevent excessive bleeding.  A bandage (dressing) will be placed over the catheter insertion area. The procedure may vary among health care providers and hospitals. What happens after the procedure?  Your blood pressure, heart rate, breathing rate, and blood oxygen level will be monitored until the medicines you were given have worn off.  Your catheter insertion area will be monitored for bleeding. You will need to lie still for a few hours to ensure that you do not bleed from the catheter insertion area.  Do not drive for 24 hours or as long as directed by your health care provider. Summary  Cardiac ablation is a procedure to disable (ablate) a small amount of heart tissue in very specific places. Ablating some of the problem areas can improve the heart rhythm or return it to normal.  During the procedure, electrical currents will be sent from the catheter to ablate heart tissue in desired areas. This information is not intended to replace advice given to you by your health care provider. Make sure you discuss any questions you have with your health care provider. Document Released: 07/11/2008 Document Revised: 01/12/2016 Document Reviewed: 01/12/2016 Elsevier Interactive Patient Education  2018 Elsevier Inc.  

## 2016-08-30 NOTE — Progress Notes (Signed)
Electrophysiology Office Note   Date:  08/30/2016   ID:  Larry Vasquez, DOB [birth year], MRN [id number]  PCP:  Larry Sacramento, MD  Cardiologist:  Angelena Form Primary Electrophysiologist:  Larry Vasquez Larry Leeds, MD    Chief Complaint  Patient presents with  . Advice Only    Persistent Afib/discuss ablation     History of Present Illness: Larry Vasquez is a 66 y.o. male who is being seen today for the evaluation of atrial fibrillation at the request of Larry Sacramento, MD. Presenting today for electrophysiology evaluation. He has a history of atrial fibrillation and atrial flutter. He was admitted to the hospital on 06/04/16. 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 He was sent to the emergency room was found to be in acute heart failure. He was also found to be in new onset atrial flutter. 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 He underwent cardioversion on 06/24/16. On follow-up in clinic, he was found to be back in atrial fibrillation with ventricular rate of 102.    Today, he denies symptoms of palpitations, chest pain, shortness of breath, orthopnea, PND, lower extremity edema, claudication, dizziness, presyncope, syncope, bleeding, or neurologic sequela. The patient is tolerating medications without difficulties.    Past Medical History:  Diagnosis Date  . Cataract    Left eye  . CML (chronic myelocytic leukemia) ([place])   . Leukocytosis, unspecified 10/17/2012  . Thrombocytosis ([place]) 10/17/2012  . White coat hypertension 06/06/2014   Past Surgical History:  Procedure Laterality Date  . CARDIOVERSION [name] 06/24/2016   Procedure: CARDIOVERSION;  Surgeon: [name], MD;  Location: [facility];  Service: Cardiovascular;  Laterality: [name];  . CATARACT EXTRACTION Left 01/01/2013  . EYE SURGERY  2012   Detached Retina  . HERNIA REPAIR    . JOINT REPLACEMENT  2006   Total Knee Left  . TONSILLECTOMY        Current Outpatient Prescriptions  Medication Sig Dispense Refill  . APPLE CIDER VINEGAR PO Take 15 mLs by mouth every other day.    . cholecalciferol (VITAMIN D) 1000 UNITS tablet Take 2,000 Units by mouth daily.     . Coenzyme Q10 (CO Q-10) 100 MG CAPS Take 100 mg by mouth daily.    . dasatinib (SPRYCEL) 50 MG tablet Take 1 tablet (50 mg total) by mouth daily. 90 tablet 11  . Digestive Enzymes CAPS Take 1 capsule by mouth 2 (two) times daily.     Marland Kitchen ELIQUIS 5 MG TABS tablet TAKE 1 TABLET BY MOUTH TWICE A DAY 60 tablet 5  . fish oil-omega-3 fatty acids 1000 MG capsule Take 1 g by mouth daily.    . furosemide (LASIX) 20 MG tablet Take 1 tablet (20 mg total) by mouth daily. 30 tablet 5  . losartan (COZAAR) 25 MG tablet Take 1 tablet (25 mg total) by mouth daily. 90 tablet 3  . magnesium oxide (MAG-OX) 400 MG tablet Take 400 mg by mouth every morning.    . metoprolol succinate (TOPROL-XL) 200 MG 24 hr tablet Take 1 tablet (200 mg total) by mouth daily. Take with or immediately following a meal. 30 tablet 3  . MILK THISTLE PO Take by mouth daily.    Marland Kitchen OVER THE COUNTER MEDICATION ASEA dietary supplement: Drink 1 ounce by mouth once a day    . TURMERIC CURCUMIN PO Take 3 capsules by mouth daily.     . vitamin B-12 (CYANOCOBALAMIN) 1000  MCG tablet Take 1,000 mcg by mouth daily.     No current facility-administered medications for this visit.     Allergies:   Corticosteroids   Social History:  The patient  reports that he has never smoked. He has never used smokeless tobacco. He reports that he drinks about 4.2 oz of alcohol per week . He reports that he does not use drugs.   Family History:  The patient's family history includes Arthritis in his mother; Atrial fibrillation in his father; CAD in his father; Cancer in his father; Chronic Renal Failure in his father; Diabetes in his father; Healthy in his brother and brother; Hypertension in his father and mother.    ROS:  Please see the  history of present illness.   Otherwise, review of systems is positive for none.   All other systems are reviewed and negative.    PHYSICAL EXAM: VS:  BP 130/80   Pulse 70   Ht 6\' 2"  (1.88 m)   Wt 211 lb (95.7 kg)   BMI 27.09 kg/m  , BMI Body mass index is 27.09 kg/m. GEN: Well nourished, well developed, in no acute distress  HEENT: normal  Neck: no JVD, carotid bruits, or masses Cardiac: RRR; no murmurs, rubs, or gallops,no edema  Respiratory:  clear to auscultation bilaterally, normal work of breathing GI: soft, nontender, nondistended, + BS MS: no deformity or atrophy  Skin: warm and dry,  Neuro:  Strength and sensation are intact Psych: euthymic mood, full affect  EKG:  EKG is chart ordered today. Personal review of the ekg ordered shows atrial fibrillation, rate 106  Recent Labs: 05/25/2016: B Natriuretic Peptide 348.0; Magnesium 2.1; TSH 2.131 07/22/2016: ALT 62; BUN 16.6; Creatinine 1.1; HGB 14.0; Platelets 148; Potassium 4.5; Sodium 138    Lipid Panel  No results found for: CHOL, TRIG, HDL, CHOLHDL, VLDL, LDLCALC, LDLDIRECT   Wt Readings from Last 3 Encounters:  08/30/16 211 lb (95.7 kg)  08/03/16 216 lb 9.6 oz (98.2 kg)  07/14/16 215 lb (97.5 kg)      Other studies Reviewed: Additional studies/ records that were reviewed today include: TTE 05/26/16  Review of the above records today demonstrates:  - Left ventricle: The cavity size was normal. Wall thickness was   increased in a pattern of mild LVH. Systolic function was   moderately reduced. The estimated ejection fraction was in the   range of 35% to 40%. Diffuse hypokinesis. Indeterminant diastolic   function (atrial fibrillation). - Aortic valve: There was no stenosis. There was trivial   regurgitation. - Aorta: Mildly dilated aortic root. Aortic root dimension: 41 mm   (ED). - Mitral valve: There was mild regurgitation. - Left atrium: The atrium was severely dilated. - Right ventricle: The cavity size  was normal. Systolic function   was moderately reduced. - Right atrium: The atrium was moderately to severely dilated. - Tricuspid valve: Peak RV-RA gradient (S): 23 mm Hg. - Pulmonary arteries: PA peak pressure: 38 mm Hg (S). - Systemic veins: IVC measured 2.4 cm with < 50% respirophasic   variation, suggesting RA pressure 15 mmHg. - Pericardium, extracardiac: A trivial pericardial effusion was   identified posterior to the heart.   ASSESSMENT AND PLAN:  1.  Persistent atrial fibrillation/flutter: Currently on Eliquis. Unfortunately, his left atrium is severely dilated. This would decrease the effectiveness of ablation. He never started his amiodarone as he is worried about side effects. We discussed options of therapy including medications versus ablation. Risks and benefits  of ablation were discussed. Risks include bleeding, tamponade, heart block, stroke, and damage to surrounding organs, among others. He understands these risks, as well as the lower rates of success due to a severely dilated left atrium and heart failure. He would like ablation at this time.  This patients CHA2DS2-VASc Score and unadjusted Ischemic Stroke Rate (% per year) is equal to 2.2 % stroke rate/year from a score of 2  Above score calculated as 1 point each if present [CHF, HTN, DM, Vascular=MI/PAD/Aortic Plaque, Age if 65-74, or Male] Above score calculated as 2 points each if present [Age > 75, or Stroke/TIA/TE]  2. Systolic heart failure: EF 35-40%. On ARB and BB.    Current medicines are reviewed at length with the patient today.   The patient does not have concerns regarding his medicines.  The following changes were made today:  none  Labs/ tests ordered today include:  No orders of the defined types were placed in this encounter.    Disposition:   FU with [place] Ganoe 4 months  Signed, Noel Rodier Larry Leeds, MD  08/30/2016 9:33 AM     [facility] [address] [place] [address] [phone] (office) [phone] (fax)

## 2016-09-01 ENCOUNTER — Telehealth: Payer: Self-pay | Admitting: *Deleted

## 2016-09-01 DIAGNOSIS — I4819 Other persistent atrial fibrillation: Secondary | ICD-10-CM

## 2016-09-01 NOTE — Addendum Note (Signed)
Addended by: Stanton Kidney on: 09/01/2016 12:11 PM   Modules accepted: Orders

## 2016-09-01 NOTE — Telephone Encounter (Signed)
Patient would like to have AFib ablation 8/3.  He is aware I will arrange and call him by end of next to discuss/review instructions.

## 2016-09-09 NOTE — Telephone Encounter (Signed)
[email or web address]

## 2016-09-09 NOTE — Telephone Encounter (Signed)
Follow Up:; ° ° °Returning your call. °

## 2016-09-10 ENCOUNTER — Other Ambulatory Visit: Payer: Self-pay | Admitting: Cardiology

## 2016-09-10 ENCOUNTER — Encounter: Payer: Self-pay | Admitting: *Deleted

## 2016-09-10 MED ORDER — METOPROLOL SUCCINATE ER 200 MG PO TB24: 200.0000 mg | ORAL_TABLET | Freq: Every day | ORAL | 3 refills | Status: DC

## 2016-09-10 NOTE — Telephone Encounter (Signed)
Follow Up ° ° ° ° ° ° ° °Pt returning phone call °

## 2016-09-10 NOTE — Telephone Encounter (Signed)
Scheduled AFib ablation for 8/3. Letter of instructions reviewed with patient and mailed to home address & sent via [place]. Pt understands office will call him to arrange cardiac CT within 7 days prior to procedure. Post procedure follow up appts made. Patient verbalized understanding and agreeable to plan.

## 2016-09-13 NOTE — Addendum Note (Signed)
Addended by: Stanton Kidney on: 09/13/2016 05:30 PM   Modules accepted: Orders

## 2016-09-29 ENCOUNTER — Other Ambulatory Visit: Payer: Self-pay | Admitting: *Deleted

## 2016-09-29 DIAGNOSIS — Z01812 Encounter for preprocedural laboratory examination: Secondary | ICD-10-CM

## 2016-09-29 DIAGNOSIS — I4819 Other persistent atrial fibrillation: Secondary | ICD-10-CM

## 2016-09-30 ENCOUNTER — Other Ambulatory Visit: Payer: BLUE CROSS/BLUE SHIELD | Admitting: *Deleted

## 2016-09-30 DIAGNOSIS — Z01812 Encounter for preprocedural laboratory examination: Secondary | ICD-10-CM

## 2016-09-30 DIAGNOSIS — I4819 Other persistent atrial fibrillation: Secondary | ICD-10-CM

## 2016-09-30 LAB — CBC WITH DIFFERENTIAL/PLATELET
BASOS: 1 %
Basophils Absolute: 0.1 10*3/uL (ref 0.0–0.2)
EOS (ABSOLUTE): 0.2 10*3/uL (ref 0.0–0.4)
EOS: 4 %
HEMATOCRIT: 43.5 % (ref 37.5–51.0)
HEMOGLOBIN: 14.3 g/dL (ref 13.0–17.7)
IMMATURE GRANULOCYTES: 0 %
Immature Grans (Abs): 0 10*3/uL (ref 0.0–0.1)
LYMPHS ABS: 1.2 10*3/uL (ref 0.7–3.1)
Lymphs: 29 %
MCH: 31.3 pg (ref 26.6–33.0)
MCHC: 32.9 g/dL (ref 31.5–35.7)
MCV: 95 fL (ref 79–97)
MONOCYTES: 14 %
Monocytes Absolute: 0.6 10*3/uL (ref 0.1–0.9)
Neutrophils Absolute: 2.2 10*3/uL (ref 1.4–7.0)
Neutrophils: 52 %
Platelets: 169 10*3/uL (ref 150–379)
RBC: 4.57 x10E6/uL (ref 4.14–5.80)
RDW: 15.1 % (ref 12.3–15.4)
WBC: 4.2 10*3/uL (ref 3.4–10.8)

## 2016-09-30 LAB — BASIC METABOLIC PANEL
BUN/Creatinine Ratio: 20 (ref 10–24)
BUN: 22 mg/dL (ref 8–27)
CO2: 21 mmol/L (ref 20–29)
CREATININE: 1.11 mg/dL (ref 0.76–1.27)
Calcium: 9.1 mg/dL (ref 8.6–10.2)
Chloride: 105 mmol/L (ref 96–106)
GFR calc Af Amer: 79 mL/min/{1.73_m2} (ref >59)
GFR, EST NON AFRICAN AMERICAN: 68 mL/min/{1.73_m2} (ref >59)
GLUCOSE: 53 mg/dL — AB (ref 65–99)
Potassium: 4.5 mmol/L (ref 3.5–5.2)
Sodium: 140 mmol/L (ref 134–144)

## 2016-10-05 ENCOUNTER — Ambulatory Visit (HOSPITAL_COMMUNITY)
Admission: RE | Admit: 2016-10-05 | Discharge: 2016-10-05 | Disposition: A | Discharge status: Home / Self Care | Payer: BLUE CROSS/BLUE SHIELD | Source: Ambulatory Visit | Attending: Cardiology | Admitting: Cardiology

## 2016-10-05 ENCOUNTER — Encounter (HOSPITAL_COMMUNITY): Payer: Self-pay

## 2016-10-05 ENCOUNTER — Ambulatory Visit (HOSPITAL_COMMUNITY): Payer: BLUE CROSS/BLUE SHIELD

## 2016-10-05 DIAGNOSIS — I4891 Unspecified atrial fibrillation: Secondary | ICD-10-CM

## 2016-10-05 DIAGNOSIS — J90 Pleural effusion, not elsewhere classified: Secondary | ICD-10-CM | POA: Insufficient documentation

## 2016-10-05 DIAGNOSIS — I481 Persistent atrial fibrillation: Secondary | ICD-10-CM | POA: Insufficient documentation

## 2016-10-05 DIAGNOSIS — I313 Pericardial effusion (noninflammatory): Secondary | ICD-10-CM | POA: Diagnosis not present

## 2016-10-05 DIAGNOSIS — I4819 Other persistent atrial fibrillation: Secondary | ICD-10-CM

## 2016-10-05 DIAGNOSIS — J9811 Atelectasis: Secondary | ICD-10-CM | POA: Insufficient documentation

## 2016-10-05 DIAGNOSIS — I700 Atherosclerosis of aorta: Secondary | ICD-10-CM | POA: Diagnosis not present

## 2016-10-05 DIAGNOSIS — I77810 Thoracic aortic ectasia: Secondary | ICD-10-CM | POA: Diagnosis not present

## 2016-10-05 DIAGNOSIS — I517 Cardiomegaly: Secondary | ICD-10-CM | POA: Insufficient documentation

## 2016-10-05 MED ORDER — METOPROLOL TARTRATE 5 MG/5ML IV SOLN
INTRAVENOUS | Status: AC
  Filled 2016-10-05: qty 5

## 2016-10-05 MED ORDER — IOPAMIDOL (ISOVUE-370) INJECTION 76%
INTRAVENOUS | Status: AC
  Administered 2016-10-05: 80 mL
  Filled 2016-10-05: qty 100

## 2016-10-05 MED ORDER — METOPROLOL TARTRATE 5 MG/5ML IV SOLN
5.0000 mg | Freq: Once | INTRAVENOUS | Status: AC
  Administered 2016-10-05: 5 mg via INTRAVENOUS

## 2016-10-06 ENCOUNTER — Telehealth: Payer: Self-pay | Admitting: Cardiology

## 2016-10-06 NOTE — Telephone Encounter (Signed)
DISCUSSED WITH DR Caryl Comes AND THOUGHT THAT WOULD BE OKAY  PT AWARE MAY PLAY GOLF A WEEK AFTER AFIB ABLATION .Adonis Housekeeper

## 2016-10-06 NOTE — Telephone Encounter (Signed)
New message    Pt is calling about his ablation scheduled for Friday. He said he wants to touch base about playing golf a week after procedure.

## 2016-10-08 ENCOUNTER — Encounter (HOSPITAL_COMMUNITY)
Admission: RE | Disposition: A | Discharge status: Home / Self Care | Payer: Self-pay | Source: Ambulatory Visit | Attending: Cardiology

## 2016-10-08 ENCOUNTER — Ambulatory Visit (HOSPITAL_COMMUNITY): Payer: BLUE CROSS/BLUE SHIELD | Admitting: Certified Registered Nurse Anesthetist

## 2016-10-08 ENCOUNTER — Encounter (HOSPITAL_COMMUNITY): Payer: Self-pay | Admitting: Certified Registered Nurse Anesthetist

## 2016-10-08 ENCOUNTER — Ambulatory Visit (HOSPITAL_COMMUNITY)
Admission: RE | Admit: 2016-10-08 | Discharge: 2016-10-09 | Disposition: A | Discharge status: Home / Self Care | Payer: BLUE CROSS/BLUE SHIELD | Source: Ambulatory Visit | Attending: Cardiology | Admitting: Cardiology

## 2016-10-08 DIAGNOSIS — Z7901 Long term (current) use of anticoagulants: Secondary | ICD-10-CM | POA: Insufficient documentation

## 2016-10-08 DIAGNOSIS — I110 Hypertensive heart disease with heart failure: Secondary | ICD-10-CM | POA: Insufficient documentation

## 2016-10-08 DIAGNOSIS — I5022 Chronic systolic (congestive) heart failure: Secondary | ICD-10-CM | POA: Insufficient documentation

## 2016-10-08 DIAGNOSIS — I481 Persistent atrial fibrillation: Secondary | ICD-10-CM

## 2016-10-08 DIAGNOSIS — Z8249 Family history of ischemic heart disease and other diseases of the circulatory system: Secondary | ICD-10-CM | POA: Insufficient documentation

## 2016-10-08 DIAGNOSIS — I4892 Unspecified atrial flutter: Secondary | ICD-10-CM | POA: Diagnosis not present

## 2016-10-08 DIAGNOSIS — I4891 Unspecified atrial fibrillation: Secondary | ICD-10-CM | POA: Insufficient documentation

## 2016-10-08 DIAGNOSIS — C9210 Chronic myeloid leukemia, BCR/ABL-positive, not having achieved remission: Secondary | ICD-10-CM | POA: Diagnosis not present

## 2016-10-08 DIAGNOSIS — Z79899 Other long term (current) drug therapy: Secondary | ICD-10-CM | POA: Diagnosis not present

## 2016-10-08 DIAGNOSIS — Z9889 Other specified postprocedural states: Secondary | ICD-10-CM

## 2016-10-08 HISTORY — PX: ATRIAL FIBRILLATION ABLATION: EP1191

## 2016-10-08 LAB — POCT ACTIVATED CLOTTING TIME
ACTIVATED CLOTTING TIME: 224 s
ACTIVATED CLOTTING TIME: 191 s
Activated Clotting Time: 296 seconds
Activated Clotting Time: 312 seconds

## 2016-10-08 SURGERY — ATRIAL FIBRILLATION ABLATION
Anesthesia: General

## 2016-10-08 MED ORDER — MIDAZOLAM HCL 5 MG/5ML IJ SOLN
INTRAMUSCULAR | Status: DC | PRN
  Administered 2016-10-08: 2 mg via INTRAVENOUS

## 2016-10-08 MED ORDER — PHENYLEPHRINE HCL 10 MG/ML IJ SOLN
INTRAMUSCULAR | Status: DC | PRN
  Administered 2016-10-08: 80 ug via INTRAVENOUS

## 2016-10-08 MED ORDER — PROPOFOL 10 MG/ML IV BOLUS
INTRAVENOUS | Status: DC | PRN
  Administered 2016-10-08: 150 mg via INTRAVENOUS

## 2016-10-08 MED ORDER — SUGAMMADEX SODIUM 200 MG/2ML IV SOLN
INTRAVENOUS | Status: DC | PRN
  Administered 2016-10-08: 200 mg via INTRAVENOUS

## 2016-10-08 MED ORDER — APIXABAN 5 MG PO TABS
5.0000 mg | ORAL_TABLET | Freq: Two times a day (BID) | ORAL | Status: DC
Start: 2016-10-08 — End: 2016-10-09
  Administered 2016-10-08 – 2016-10-09 (×2): 5 mg via ORAL
  Filled 2016-10-08 (×2): qty 1

## 2016-10-08 MED ORDER — DIGESTIVE ENZYMES PO CAPS: 1.0000 | ORAL_CAPSULE | Freq: Every day | ORAL | Status: DC

## 2016-10-08 MED ORDER — METOPROLOL SUCCINATE ER 100 MG PO TB24
200.0000 mg | ORAL_TABLET | Freq: Every day | ORAL | Status: DC
  Administered 2016-10-08 – 2016-10-09 (×2): 200 mg via ORAL
  Filled 2016-10-08 (×2): qty 2

## 2016-10-08 MED ORDER — LOSARTAN POTASSIUM 25 MG PO TABS
25.0000 mg | ORAL_TABLET | Freq: Every day | ORAL | Status: DC
  Administered 2016-10-08 – 2016-10-09 (×2): 25 mg via ORAL
  Filled 2016-10-08 (×2): qty 1

## 2016-10-08 MED ORDER — HEPARIN SODIUM (PORCINE) 1000 UNIT/ML IJ SOLN
INTRAMUSCULAR | Status: AC
  Filled 2016-10-08: qty 1

## 2016-10-08 MED ORDER — ADULT MULTIVITAMIN W/MINERALS CH
1.0000 | ORAL_TABLET | Freq: Every day | ORAL | Status: DC
  Administered 2016-10-08 – 2016-10-09 (×2): 1 via ORAL
  Filled 2016-10-08 (×2): qty 1

## 2016-10-08 MED ORDER — LIDOCAINE HCL (CARDIAC) 20 MG/ML IV SOLN
INTRAVENOUS | Status: DC | PRN
  Administered 2016-10-08: 40 mg via INTRAVENOUS

## 2016-10-08 MED ORDER — FENTANYL CITRATE (PF) 100 MCG/2ML IJ SOLN
INTRAMUSCULAR | Status: DC | PRN
  Administered 2016-10-08 (×2): 50 ug via INTRAVENOUS

## 2016-10-08 MED ORDER — ACETAMINOPHEN 325 MG PO TABS
650.0000 mg | ORAL_TABLET | Freq: Four times a day (QID) | ORAL | Status: DC | PRN
  Administered 2016-10-08: 650 mg via ORAL
  Filled 2016-10-08: qty 2

## 2016-10-08 MED ORDER — DASATINIB 50 MG PO TABS: 50.0000 mg | ORAL_TABLET | Freq: Every day | ORAL | Status: DC

## 2016-10-08 MED ORDER — CO Q-10 100 MG PO CAPS: 100.0000 mg | ORAL_CAPSULE | Freq: Every day | ORAL | Status: DC

## 2016-10-08 MED ORDER — VITAMIN D 1000 UNITS PO TABS
1000.0000 [IU] | ORAL_TABLET | Freq: Every day | ORAL | Status: DC
  Administered 2016-10-08 – 2016-10-09 (×2): 1000 [IU] via ORAL
  Filled 2016-10-08 (×2): qty 1

## 2016-10-08 MED ORDER — SODIUM CHLORIDE 0.9 % IV SOLN
INTRAVENOUS | Status: DC | PRN
  Administered 2016-10-08 (×2): via INTRAVENOUS

## 2016-10-08 MED ORDER — MAGNESIUM OXIDE 400 (241.3 MG) MG PO TABS: 400.0000 mg | ORAL_TABLET | Freq: Every day | ORAL | Status: DC

## 2016-10-08 MED ORDER — DOBUTAMINE IN D5W 4-5 MG/ML-% IV SOLN
INTRAVENOUS | Status: DC | PRN
  Administered 2016-10-08: 20 ug/kg/min via INTRAVENOUS

## 2016-10-08 MED ORDER — PROTAMINE SULFATE 10 MG/ML IV SOLN
INTRAVENOUS | Status: DC | PRN
  Administered 2016-10-08: 40 mg via INTRAVENOUS

## 2016-10-08 MED ORDER — SODIUM CHLORIDE 0.9 % IV SOLN: 250.0000 mL | INTRAVENOUS | Status: DC | PRN

## 2016-10-08 MED ORDER — LIDOCAINE HCL (PF) 1 % IJ SOLN
INTRAMUSCULAR | Status: DC | PRN
  Administered 2016-10-08: 45 mL

## 2016-10-08 MED ORDER — ROCURONIUM BROMIDE 100 MG/10ML IV SOLN
INTRAVENOUS | Status: DC | PRN
  Administered 2016-10-08: 20 mg via INTRAVENOUS
  Administered 2016-10-08: 60 mg via INTRAVENOUS

## 2016-10-08 MED ORDER — ACETAMINOPHEN 325 MG PO TABS: 650.0000 mg | ORAL_TABLET | ORAL | Status: DC | PRN

## 2016-10-08 MED ORDER — SODIUM CHLORIDE 0.9% FLUSH: 3.0000 mL | INTRAVENOUS | Status: DC | PRN

## 2016-10-08 MED ORDER — DOBUTAMINE IN D5W 4-5 MG/ML-% IV SOLN
INTRAVENOUS | Status: AC
  Filled 2016-10-08: qty 250

## 2016-10-08 MED ORDER — PHENYLEPHRINE HCL 10 MG/ML IJ SOLN
INTRAVENOUS | Status: DC | PRN
  Administered 2016-10-08: 25 ug/min via INTRAVENOUS

## 2016-10-08 MED ORDER — RISAQUAD PO CAPS: 1.0000 | ORAL_CAPSULE | Freq: Every day | ORAL | Status: DC

## 2016-10-08 MED ORDER — FUROSEMIDE 20 MG PO TABS
20.0000 mg | ORAL_TABLET | ORAL | Status: DC
  Administered 2016-10-08: 20 mg via ORAL
  Filled 2016-10-08: qty 1

## 2016-10-08 MED ORDER — HEPARIN SODIUM (PORCINE) 1000 UNIT/ML IJ SOLN
INTRAMUSCULAR | Status: DC | PRN
  Administered 2016-10-08: 3000 [IU] via INTRAVENOUS
  Administered 2016-10-08: 14000 [IU] via INTRAVENOUS
  Administered 2016-10-08: 2000 [IU] via INTRAVENOUS
  Administered 2016-10-08: 10000 [IU] via INTRAVENOUS

## 2016-10-08 MED ORDER — SODIUM CHLORIDE 0.9% FLUSH
3.0000 mL | Freq: Two times a day (BID) | INTRAVENOUS | Status: DC
  Administered 2016-10-08 – 2016-10-09 (×2): 3 mL via INTRAVENOUS

## 2016-10-08 MED ORDER — ONDANSETRON HCL 4 MG/2ML IJ SOLN: 4.0000 mg | Freq: Four times a day (QID) | INTRAMUSCULAR | Status: DC | PRN

## 2016-10-08 MED ORDER — LIDOCAINE HCL (PF) 1 % IJ SOLN
INTRAMUSCULAR | Status: AC
  Filled 2016-10-08: qty 60

## 2016-10-08 MED ORDER — HEPARIN (PORCINE) IN NACL 2-0.9 UNIT/ML-% IJ SOLN
INTRAMUSCULAR | Status: AC | PRN
  Administered 2016-10-08: 2000 mL

## 2016-10-08 MED ORDER — HEPARIN SODIUM (PORCINE) 1000 UNIT/ML IJ SOLN
INTRAMUSCULAR | Status: DC | PRN
  Administered 2016-10-08: 1000 [IU] via INTRAVENOUS

## 2016-10-08 MED ORDER — ONDANSETRON HCL 4 MG/2ML IJ SOLN
INTRAMUSCULAR | Status: DC | PRN
  Administered 2016-10-08: 4 mg via INTRAVENOUS

## 2016-10-08 SURGICAL SUPPLY — 20 items
BAG SNAP BAND KOVER 36X36 (MISCELLANEOUS) ×2 IMPLANT
BLANKET WARM UNDERBOD FULL ACC (MISCELLANEOUS) ×3 IMPLANT
CATH SMTCH THERMOCOOL SF DF (CATHETERS) ×1 IMPLANT
CATH SOUNDSTAR ECO REPROCESSED (CATHETERS) ×1 IMPLANT
CATH VARIABLE LASSO NAV 2515 (CATHETERS) ×1 IMPLANT
CATH WEBSTER BI DIR CS D-F CRV (CATHETERS) ×1 IMPLANT
COVER SWIFTLINK CONNECTOR (BAG) ×2 IMPLANT
HOVERMATT SINGLE USE (MISCELLANEOUS) ×1 IMPLANT
NDL TRANSSEPTAL BRK 98CM (NEEDLE) IMPLANT
NEEDLE TRANSSEPTAL BRK 98CM (NEEDLE) ×2 IMPLANT
PACK EP LATEX FREE (CUSTOM PROCEDURE TRAY) ×2
PACK EP LF (CUSTOM PROCEDURE TRAY) ×1 IMPLANT
PAD DEFIB LIFELINK (PAD) ×2 IMPLANT
PATCH CARTO3 (PAD) ×1 IMPLANT
SHEATH AGILIS NXT 8.5F 71CM (SHEATH) ×2 IMPLANT
SHEATH AVANTI 11F 11CM (SHEATH) ×1 IMPLANT
SHEATH PINNACLE 7F 10CM (SHEATH) ×1 IMPLANT
SHEATH PINNACLE 8F 10CM (SHEATH) ×2 IMPLANT
SHEATH PINNACLE 9F 10CM (SHEATH) ×2 IMPLANT
TUBING SMART ABLATE COOLFLOW (TUBING) ×1 IMPLANT

## 2016-10-08 NOTE — Discharge Instructions (Signed)
No driving for 4 days. No lifting over 5 lbs for 1 week. No vigorous or sexual activity for 1 week. You may return to work on 10/15/16. Keep procedure site clean & dry. If you notice increased pain, swelling, bleeding or pus, call/return!  You may shower, but no soaking baths/hot tubs/pools for 1 week.     You have an appointment set up with the [facility].  Multiple studies have shown that being followed by a dedicated atrial fibrillation clinic in addition to the standard care you receive from your other physicians improves health. We believe that enrollment in the atrial fibrillation clinic will allow [place] to better care for you.   The phone number to the [facility] is [phone]. The clinic is staffed Monday through Friday from 8:30am to 5pm.  Parking Directions: The clinic is located in the Heart and Vascular Building connected to [facility]. 1)From [address] turn on to [address] and go to the 3rd entrance  (Heart and Vascular entrance) on the right. 2)Look to the right for Heart &Vascular Parking Garage. 3)A code for the entrance is required please call the clinic to receive this.   4)Take the elevators to the 1st floor. Registration is in the room with the glass walls at the end of the hallway.  If you have any trouble parking or locating the clinic, please dont hesitate to call [phone].

## 2016-10-08 NOTE — H&P (Signed)
History of Present Illness: Larry Vasquez is a 66 y.o. male who is being seen today for the evaluation of atrial fibrillation at the request of [name], MD. Presenting today for electrophysiology evaluation. He has a history of atrial fibrillation and atrial flutter. He was admitted to the hospital on 06/04/16. 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 He was sent to the emergency room was found to be in acute heart failure. He was also found to be in new onset atrial flutter. 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 He underwent cardioversion on 06/24/16. On follow-up in clinic, he was found to be back in atrial fibrillation with ventricular rate of 102.    Today, he denies symptoms of palpitations, chest pain, shortness of breath, orthopnea, PND, lower extremity edema, claudication, dizziness, presyncope, syncope, bleeding, or neurologic sequela. The patient is tolerating medications without difficulties.        Past Medical History:  Diagnosis Date  . Cataract    Left eye  . CML (chronic myelocytic leukemia) ([place])   . Leukocytosis, unspecified 10/17/2012  . Thrombocytosis ([place]) 10/17/2012  . White coat hypertension 06/06/2014        Past Surgical History:  Procedure Laterality Date  . CARDIOVERSION [name] 06/24/2016   Procedure: CARDIOVERSION;  Surgeon: [name], MD;  Location: [facility];  Service: Cardiovascular;  Laterality: [name];  . CATARACT EXTRACTION Left 01/01/2013  . EYE SURGERY  2012   Detached Retina  . HERNIA REPAIR    . JOINT REPLACEMENT  2006   Total Knee Left  . TONSILLECTOMY             Current Outpatient Prescriptions  Medication Sig Dispense Refill  . APPLE CIDER VINEGAR PO Take 15 mLs by mouth every other day.    . cholecalciferol (VITAMIN D) 1000 UNITS tablet Take 2,000 Units by mouth daily.     . Coenzyme Q10 (CO Q-10) 100 MG CAPS Take 100 mg by mouth daily.     . dasatinib (SPRYCEL) 50 MG tablet Take 1 tablet (50 mg total) by mouth daily. 90 tablet 11  . Digestive Enzymes CAPS Take 1 capsule by mouth 2 (two) times daily.     Marland Kitchen ELIQUIS 5 MG TABS tablet TAKE 1 TABLET BY MOUTH TWICE A DAY 60 tablet 5  . fish oil-omega-3 fatty acids 1000 MG capsule Take 1 g by mouth daily.    . furosemide (LASIX) 20 MG tablet Take 1 tablet (20 mg total) by mouth daily. 30 tablet 5  . losartan (COZAAR) 25 MG tablet Take 1 tablet (25 mg total) by mouth daily. 90 tablet 3  . magnesium oxide (MAG-OX) 400 MG tablet Take 400 mg by mouth every morning.    . metoprolol succinate (TOPROL-XL) 200 MG 24 hr tablet Take 1 tablet (200 mg total) by mouth daily. Take with or immediately following a meal. 30 tablet 3  . MILK THISTLE PO Take by mouth daily.    Marland Kitchen OVER THE COUNTER MEDICATION ASEA dietary supplement: Drink 1 ounce by mouth once a day    . TURMERIC CURCUMIN PO Take 3 capsules by mouth daily.     . vitamin B-12 (CYANOCOBALAMIN) 1000 MCG tablet Take 1,000 mcg by mouth daily.     No current facility-administered medications for this visit.     Allergies:   Corticosteroids   Social History:  The patient  reports that he has never smoked. He has never used  smokeless tobacco. He reports that he drinks about 4.2 oz of alcohol per week . He reports that he does not use drugs.   Family History:  The patient's family history includes Arthritis in his mother; Atrial fibrillation in his father; CAD in his father; Cancer in his father; Chronic Renal Failure in his father; Diabetes in his father; Healthy in his brother and brother; Hypertension in his father and mother.    ROS:  Please see the history of present illness.   Otherwise, review of systems is positive for fatigue.   All other systems are reviewed and negative.   PHYSICAL EXAM: VS:  BP (!) 121/95 (BP Location: Right Arm)   Pulse (!) 57   Temp 97.7 F (36.5 C) (Oral)   Ht 6\' 2"  (1.88 m)   Wt 208 lb  (94.3 kg)   SpO2 98%   BMI 26.71 kg/m  , BMI Body mass index is 26.71 kg/m. GEN: Well nourished, well developed, in no acute distress  HEENT: normal  Neck: no JVD, carotid bruits, or masses Cardiac: iRRR; no murmurs, rubs, or gallops,no edema  Respiratory:  clear to auscultation bilaterally, normal work of breathing GI: soft, nontender, nondistended, + BS MS: no deformity or atrophy  Skin: warm and dry Neuro:  Strength and sensation are intact Psych: euthymic mood, full affect   Recent Labs: 05/25/2016: B Natriuretic Peptide 348.0; Magnesium 2.1; TSH 2.131 07/22/2016: ALT 62; BUN 16.6; Creatinine 1.1; HGB 14.0; Platelets 148; Potassium 4.5; Sodium 138    Lipid Panel  Labs(Brief)  No results found for: CHOL, TRIG, HDL, CHOLHDL, VLDL, LDLCALC, LDLDIRECT        Wt Readings from Last 3 Encounters:  08/30/16 211 lb (95.7 kg)  08/03/16 216 lb 9.6 oz (98.2 kg)  07/14/16 215 lb (97.5 kg)      Other studies Reviewed: Additional studies/ records that were reviewed today include: TTE 05/26/16  Review of the above records today demonstrates:  - Left ventricle: The cavity size was normal. Wall thickness was increased in a pattern of mild LVH. Systolic function was moderately reduced. The estimated ejection fraction was in the range of 35% to 40%. Diffuse hypokinesis. Indeterminant diastolic function (atrial fibrillation). - Aortic valve: There was no stenosis. There was trivial regurgitation. - Aorta: Mildly dilated aortic root. Aortic root dimension: 41 mm (ED). - Mitral valve: There was mild regurgitation. - Left atrium: The atrium was severely dilated. - Right ventricle: The cavity size was normal. Systolic function was moderately reduced. - Right atrium: The atrium was moderately to severely dilated. - Tricuspid valve: Peak RV-RA gradient (S): 23 mm Hg. - Pulmonary arteries: PA peak pressure: 38 mm Hg (S). - Systemic veins: IVC measured 2.4 cm with <  50% respirophasic variation, suggesting RA pressure 15 mmHg. - Pericardium, extracardiac: A trivial pericardial effusion was identified posterior to the heart.   ASSESSMENT AND PLAN:  1.  Persistent atrial fibrillation/flutter: Currently on Eliquis. Last dose last night. Presents for ablation. Risks and benefits discussed. Risks include but not limited to bleeding, tamponade, heart block, stroke and damage to sourrouiding organs. He understands the risks and has agreed to the procedure.  This patients CHA2DS2-VASc Score and unadjusted Ischemic Stroke Rate (% per year) is equal to 2.2 % stroke rate/year from a score of 2  Above score calculated as 1 point each if present [CHF, HTN, DM, Vascular=MI/PAD/Aortic Plaque, Age if 65-74, or Male] Above score calculated as 2 points each if present [Age > 75, or Stroke/TIA/TE]  2. Systolic heart failure: EF 35-40%. On ARB and BB.

## 2016-10-08 NOTE — Anesthesia Preprocedure Evaluation (Signed)
Anesthesia Evaluation  Patient identified by MRN, date of birth, ID band Patient awake    Reviewed: Allergy & Precautions, NPO status , Patient's Chart, lab work & pertinent test results, reviewed documented beta blocker date and time   History of Anesthesia Complications Negative for: history of anesthetic complications  Airway Mallampati: II  TM Distance: >3 FB Neck ROM: Full    Dental  (+) Teeth Intact   Pulmonary neg pulmonary ROS,    breath sounds clear to auscultation       Cardiovascular hypertension, Pt. on medications and Pt. on home beta blockers +CHF  + dysrhythmias Atrial Fibrillation  Rhythm:Irregular     Neuro/Psych negative neurological ROS  negative psych ROS   GI/Hepatic negative GI ROS, Neg liver ROS,   Endo/Other  negative endocrine ROS  Renal/GU Renal InsufficiencyRenal disease     Musculoskeletal   Abdominal   Peds  Hematology negative hematology ROS (+)   Anesthesia Other Findings   Reproductive/Obstetrics                             Anesthesia Physical Anesthesia Plan  ASA: II  Anesthesia Plan: General   Post-op Pain Management:    Induction: Intravenous  PONV Risk Score and Plan: 2 and Ondansetron and Dexamethasone  Airway Management Planned: Oral ETT  Additional Equipment: None  Intra-op Plan:   Post-operative Plan: Extubation in OR  Informed Consent: I have reviewed the patients History and Physical, chart, labs and discussed the procedure including the risks, benefits and alternatives for the proposed anesthesia with the patient or authorized representative who has indicated his/her understanding and acceptance.   Dental advisory given  Plan Discussed with: CRNA and Surgeon  Anesthesia Plan Comments:         Anesthesia Quick Evaluation

## 2016-10-08 NOTE — Transfer of Care (Signed)
Immediate Anesthesia Transfer of Care Note  Patient: Larry Vasquez  Procedure(s) Performed: Procedure(s): Atrial Fibrillation Ablation ([name])  Patient Location: PACU  Anesthesia Type:General  Level of Consciousness: awake, alert  and oriented  Airway & Oxygen Therapy: Patient Spontanous Breathing and Patient connected to nasal cannula oxygen  Post-op Assessment: Report given to RN, Post -op Vital signs reviewed and stable and Patient moving all extremities X 4  Post vital signs: Reviewed and stable  Last Vitals:  Vitals:   10/08/16 0927 10/08/16 1635  BP: (!) 121/95 116/72  Pulse: (!) 57 66  Resp:  12  Temp: 36.5 C 36.5 C    Last Pain:  Vitals:   10/08/16 0927  TempSrc: Oral         Complications: No apparent anesthesia complications

## 2016-10-08 NOTE — Progress Notes (Signed)
Site area: Left groin a 11 and 7 french venous sheath was removed  Site Prior to Removal:  Level 0  Pressure Applied For 20 MINUTES    Bedrest Beginning at 1900p  Manual:   Yes.    Patient Status During Pull:  stable  Post Pull Groin Site:  Level 0  Post Pull Instructions Given:  Yes.    Post Pull Pulses Present:  Yes.    Dressing Applied:  Yes.    Comments:  VS remain stable during sheath pull.

## 2016-10-08 NOTE — Progress Notes (Signed)
Site area: Right groin a 9 french venous sheath X2 was removed  Site Prior to Removal:  Level 0  Pressure Applied For 25 MINUTES    Bedrest Beginning at 1900p  Manual:   Yes.    Patient Status During Pull:  stable  Post Pull Groin Site:  Level 0  Post Pull Instructions Given:  Yes.    Post Pull Pulses Present:  Yes.    Dressing Applied:  Yes.    Comments:  VS remain stable during sheath pull.

## 2016-10-08 NOTE — Anesthesia Procedure Notes (Signed)
Procedure Name: Intubation Date/Time: 10/08/2016 1:07 PM Performed by: Rejeana Brock L Pre-anesthesia Checklist: Patient identified, Emergency Drugs available, Suction available and Patient being monitored Patient Re-evaluated:Patient Re-evaluated prior to induction Oxygen Delivery Method: Circle System Utilized Preoxygenation: Pre-oxygenation with 100% oxygen Induction Type: IV induction Ventilation: Mask ventilation without difficulty and Oral airway inserted - appropriate to patient size Laryngoscope Size: Mac and 4 Grade View: Grade I Tube type: Oral Tube size: 7.5 mm Number of attempts: 1 Airway Equipment and Method: Stylet and Oral airway Placement Confirmation: ETT inserted through vocal cords under direct vision,  positive ETCO2 and breath sounds checked- equal and bilateral Secured at: 22 cm Tube secured with: Tape Dental Injury: Teeth and Oropharynx as per pre-operative assessment

## 2016-10-09 DIAGNOSIS — I481 Persistent atrial fibrillation: Secondary | ICD-10-CM | POA: Diagnosis not present

## 2016-10-09 DIAGNOSIS — Z7901 Long term (current) use of anticoagulants: Secondary | ICD-10-CM | POA: Diagnosis not present

## 2016-10-09 DIAGNOSIS — I4892 Unspecified atrial flutter: Secondary | ICD-10-CM | POA: Diagnosis not present

## 2016-10-09 DIAGNOSIS — Z8249 Family history of ischemic heart disease and other diseases of the circulatory system: Secondary | ICD-10-CM | POA: Diagnosis not present

## 2016-10-09 DIAGNOSIS — Z9889 Other specified postprocedural states: Secondary | ICD-10-CM

## 2016-10-09 NOTE — Care Management Note (Signed)
Case Management Note  Patient Details  Name: Larry Vasquez MRN: [id number] Date of Birth: 04-13-1950  Subjective/Objective:                 Patient with order to DC to home today. Chart reviewed. No Home Health or Equipment needs, no unacknowledged Case Management consults or medication needs identified at the time of this note. Plan for DC to home. If needs arise today prior to discharge, please call Carles Collet RN CM at [phone].    Action/Plan:   Expected Discharge Date:  10/09/16               Expected Discharge Plan:  Home/Self Care  In-House Referral:     Discharge planning Services  CM Consult  Post Acute Care Choice:    Choice offered to:     DME Arranged:    DME Agency:     HH Arranged:    HH Agency:     Status of Service:  Completed, signed off  If discussed at [employer] of Stay Meetings, dates discussed:    Additional Comments:  Carles Collet, RN 10/09/2016, 9:03 AM

## 2016-10-09 NOTE — Discharge Summary (Signed)
Discharge Summary    Patient ID: Larry Vasquez,  MRN: [id number], DOB/AGE: 67-09-1950 67 y.o.  Admit date: 10/08/2016 Discharge date: 10/09/2016  Primary Care Provider: [name] Primary Cardiologist: Dr. Angelena Form EP: Dr. Curt Bears  Discharge Diagnoses    Active Problems:   Status post catheter ablation of atrial flutter   Status post catheter ablation of atrial fibrillation   Allergies Allergies  Allergen Reactions  . Corticosteroids Other (See Comments)    Muscle stiffness    Diagnostic Studies/Procedures   EP Study/ Afib and Flutter Ablation 10/08/16 CONCLUSIONS: 1. Atrial fibrillation upon presentation.   2. Successful electrical isolation and anatomical encircling of all four pulmonary veins with radiofrequency current.  A WACA approach was used 3. Additional left atrial ablation was performed with a standard box lesion created along the posterior wall of the left atrium 4. Cavo-tricuspid isthmus ablation was performed with complete bidirectional isthmus block achieved.  5. Atrial fibrillation successfully cardioverted to sinus rhythm. 6. No inducible arrhythmias following ablation both on and off of dobutamine 7. No early apparent complications.    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 Bunnie Philips, for elective afib/flutter ablation.   He was first admitted to the hospital on 06/04/16 after being noted to be tachycardiac while at his oncologist office visit. He was sent to the emergency room was found to be in acute heart failure. He was also found to be in new onset atrial fib/ flutter. 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 He underwent cardioversion on 06/24/16. However, on follow-up in clinic, he was found to be back in atrial fibrillation with ventricular rate of 102. Given his CML and treatment with  sprycel, he was determined not a candidate for AAD therapy given drug interaction risk for QT prolongation. Thus, he was referred to Dr. Curt Bears for consideration for afib/flutter ablation.   Hospital Course     Pt presented to [facility] on 10/08/16 for the planned procedure, performed by Dr. Curt Bears. He underwent successful afib/flutter catheter ablation. No inducible arrhythmias following ablation both on and off of dobutamine. He was monitored overnight and had no post procedural issues. No CP or dyspnea. No complications with catheter access site. No arrhthymias. He was continued on Eliquis. He was last seen and examined by Dr. Lovena Le, who determined he was stable for discharge home. 1 month f/u has been arranged in the Afib clinic and 3 month f/u with Dr. Curt Bears.    Consultants: none    Discharge Vitals Blood pressure 107/73, pulse 60, temperature 98.1 F (36.7 C), temperature source Oral, resp. rate 14, height 6\' 2"  (1.88 m), weight 212 lb 9.6 oz (96.4 kg), SpO2 95 %.  Filed Weights   10/08/16 0927 10/08/16 1934 10/09/16 0505  Weight: 208 lb (94.3 kg) 208 lb (94.3 kg) 212 lb 9.6 oz (96.4 kg)   Physical Exam  Constitutional: He is oriented to person, place, and time. He appears well-developed and well-nourished. No distress.  HENT:  Head: Normocephalic and atraumatic.  Eyes: Pupils are equal, round, and reactive to light. EOM are normal.  Neck: Normal range of motion. No JVD present. No thyromegaly present.  Cardiovascular: Normal rate, regular rhythm, normal heart sounds and intact distal pulses.  Exam reveals no gallop and no friction rub.   No murmur heard. Pulmonary/Chest: Effort normal and breath sounds normal. No respiratory distress. He  has no wheezes. He has no rales.  Abdominal: Soft. Bowel sounds are normal. He exhibits no distension and no mass. There is no tenderness.  Musculoskeletal: He exhibits no edema or deformity.  Neurological: He is alert and oriented to person, place,  and time.  Skin: Skin is warm and dry. No rash noted. No erythema.  Psychiatric: He has a normal mood and affect. His behavior is normal. Judgment and thought content normal.     Labs & Radiologic Studies    CBC No results for input(s): WBC, NEUTROABS, HGB, HCT, MCV, PLT in the last 72 hours. Basic Metabolic Panel No results for input(s): NA, K, CL, CO2, GLUCOSE, BUN, CREATININE, CALCIUM, MG, PHOS in the last 72 hours. Liver Function Tests No results for input(s): AST, ALT, ALKPHOS, BILITOT, PROT, ALBUMIN in the last 72 hours. No results for input(s): LIPASE, AMYLASE in the last 72 hours. Cardiac Enzymes No results for input(s): CKTOTAL, CKMB, CKMBINDEX, TROPONINI in the last 72 hours. BNP Invalid input(s): POCBNP D-Dimer No results for input(s): DDIMER in the last 72 hours. Hemoglobin A1C No results for input(s): HGBA1C in the last 72 hours. Fasting Lipid Panel No results for input(s): CHOL, HDL, LDLCALC, TRIG, CHOLHDL, LDLDIRECT in the last 72 hours. Thyroid Function Tests No results for input(s): TSH, T4TOTAL, T3FREE, THYROIDAB in the last 72 hours.  Invalid input(s): FREET3 _____________  Ct Cardiac Morph/pulm Vein W/cm&w/o Ca Score  Addendum Date: 10/05/2016   ADDENDUM REPORT: 10/05/2016 15:40 CLINICAL DATA:  Pre Ablation EXAM: Cardiac CTA MEDICATIONS: Lopressor 5mg  TECHNIQUE: The patient was scanned on a Siemens 814 slice Force scanner. Gantry rotation speed was 250 msecs. Collimation was.6 mm a 100 kV prospective scan was triggered in the ascending thoracic aorta at 140 HU's Patient was in rapid erratic atrial fibrillation during scan The 3D data set was interpreted on a dedicated work station using MPR, MIP and VRT modes. A total of 80cc of contrast was used. FINDINGS: Non-cardiac: See separate report from [facility]. No significant findings on limited lung and soft tissue windows. Calcium Score:  2746 marked in RCA and LAD Coronary Arteries: Study not ideal to  evaluate no nitro given and patient in rapid afib. RCA is calcified but does not appear to have obstructive disease nor does LM or LAD The distal LAD and circumflex not well visualized There was severe biatrial enlargement. There was no ASD/VSD or pericardial effusion The aortic root was mildly dilated at 4.2 cm The pulmonary veins drained normally into the LA. There were no anomalies. The LAA was enlarged. Initial contrast images suggested thrombus in the mid and distal appendage. However delayed wash out images 8 seconds latter were totally normal The esophagus coursed closest to the LLPV ostium LUPV:  Ostium 12.5 mm Area 1.1 cm2 LLPV:  Ostium 17.5 mm Area 3 cm2 RUPV:  Ostium 15.4 mm Area 1.9 cm2 RLPV:  Ostium 20 mm Area 2.9 cm2 IMPRESSION: 1) Dilated LAA no thrombus on delayed contrast images 2) Severe bi atrial enlargement 3) Normal pulmonary vein anatomy measurements as above 4) Mild aortic root dilatation 4.2 cm 5) Calcium score 2746 with dense calcium in LAD and RCA this is 97th percentile for age and sex. Consider f/u perfusion study especially given known decreased LV function [place] Rouge Electronically Signed   By: Jenkins Rouge M.D.   On: 10/05/2016 15:40   Result Date: 10/05/2016 EXAM: OVER-READ INTERPRETATION  CT CHEST The following report is an over-read performed by radiologist Dr. Evangeline Dakin of [facility] on 10/05/2016. This over-read does not include interpretation of cardiac or coronary anatomy or pathology. The coronary CTA interpretation by the cardiologist is attached. COMPARISON:  None. FINDINGS: Cardiovascular: Moderate to marked cardiomegaly with biatrial enlargement. Small pericardial effusion. Ectasia of the ascending thoracic aorta measuring up to approximately 4.2 cm diameter. Atherosclerosis involving the descending thoracic aorta. Mediastinum/Nodes: No pathologic lymphadenopathy involving the visualized mediastinum, hila or lower axilla. Visualized esophagus normal  in appearance. Lungs/Pleura: Moderately large right pleural effusion extending into the major fissure on the right. Associated passive atelectasis involving the right lower lobe. Atelectasis involving the left lower lobe. Remaining visualized pulmonary parenchyma clear. Central airways patent with moderate bronchial wall thickening. Upper Abdomen: Visualized extreme upper abdomen unremarkable for the arterial phase of enhancement. Musculoskeletal: Degenerative disc disease and spondylosis throughout the visualized thoracic spine. No acute abnormalities. IMPRESSION: 1. Moderately large right pleural effusion with associated passive atelectasis involving the right lower lobe. Mild atelectasis involving the left lower lobe. 2. Central bronchial wall thickening indicating asthma and/or bronchitis. 3. Moderate to marked cardiomegaly with biatrial enlargement. Small pericardial effusion. 4. Ectatic ascending thoracic aorta measuring up to approximately 4.2 cm diameter. Recommend annual imaging followup by CTA or MRA. This recommendation follows 2010 ACCF/AHA/AATS/ACR/ASA/SCA/SCAI/SIR/STS/SVM Guidelines for the Diagnosis and Management of Patients with Thoracic Aortic Disease. Circulation. 2010; 121: H209-O709. 5.  Aortic Atherosclerosis (ICD10-170.0) Electronically Signed: By: Evangeline Dakin M.D. On: 10/05/2016 14:46   Disposition   Pt is being discharged home today in good condition.  Follow-up Plans & Appointments    Follow-up Information    [facility] Follow up on 11/10/2016.   Specialty:  Cardiology Why:  1:30PM Contact information: [address] [id number] mc [address] [place] [address] [phone]       Constance Haw, MD Follow up on 01/19/2017.   Specialty:  Cardiology Why:  9:30AM Contact information: [place] [address] [phone]            Discharge Medications   Current Discharge Medication List     CONTINUE these medications which have NOT CHANGED   Details  acetaminophen (TYLENOL) 325 MG tablet Take 650 mg by mouth every 6 (six) hours as needed (for minor aches/pains.).    APPLE CIDER VINEGAR PO Take 15 mLs by mouth every other day.    cholecalciferol (VITAMIN D) 1000 UNITS tablet Take 1,000 Units by mouth daily.     Coenzyme Q10 (CO Q-10) 100 MG CAPS Take 100 mg by mouth daily.    dasatinib (SPRYCEL) 50 MG tablet Take 1 tablet (50 mg total) by mouth daily. Qty: 90 tablet, Refills: 11    Digestive Enzymes CAPS Take 1 capsule by mouth daily with lunch.     ELIQUIS 5 MG TABS tablet TAKE 1 TABLET BY MOUTH TWICE A DAY Qty: 60 tablet, Refills: 5    furosemide (LASIX) 20 MG tablet Take 1 tablet (20 mg total) by mouth daily. Qty: 30 tablet, Refills: 5    losartan (COZAAR) 25 MG tablet Take 1 tablet (25 mg total) by mouth daily. Qty: 90 tablet, Refills: 3    magnesium oxide (MAG-OX) 400 MG tablet Take 400 mg by mouth at bedtime.     metoprolol (TOPROL-XL) 200 MG 24 hr tablet Take 1 tablet (200 mg total) by mouth daily. Take with or immediately following a meal. Qty: 90 tablet, Refills: 3    MILK THISTLE PO Take 1 capsule by mouth 4 (four) times a  week.     Multiple Vitamin (MULTIVITAMIN WITH MINERALS) TABS tablet Take 1 tablet by mouth daily.    OVER THE COUNTER MEDICATION Take 1 oz by mouth daily. ASEA dietary supplement: Drink 1 ounce by mouth once a day    TURMERIC CURCUMIN PO Take 2-3 capsules by mouth daily.    Associated Diagnoses: 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[place])           Outstanding Labs/Studies   None   Duration of Discharge Encounter   Greater than 30 minutes including physician time.  Signed, Lyda Jester PA-C 10/09/2016, 8:53 AM  EP Attending  Patient seen and examined. Agree with above. He is stable for DC home. Continue his current meds. Followup with Dr. Curt Bears as outlined above.  Mikle Bosworth.D.

## 2016-10-09 NOTE — Progress Notes (Signed)
Patient received discharge instructions with wife at bedside. Care instructions on post-ablation (bilateral groins). No change in medications. Patient verbalized understanding and had no further questions. Removed PIV. Both groins assessed, level 0 and no further complaints. Escorted out by wheelchair.

## 2016-10-11 ENCOUNTER — Inpatient Hospital Stay (HOSPITAL_COMMUNITY)
Admission: RE | Admit: 2016-10-11 | Payer: BLUE CROSS/BLUE SHIELD | Source: Ambulatory Visit | Admitting: Nurse Practitioner

## 2016-10-11 ENCOUNTER — Observation Stay (HOSPITAL_COMMUNITY)
Admission: EM | Admit: 2016-10-11 | Discharge: 2016-10-14 | Disposition: A | Discharge status: Home / Self Care | Payer: BLUE CROSS/BLUE SHIELD | Attending: Emergency Medicine | Admitting: Emergency Medicine

## 2016-10-11 ENCOUNTER — Emergency Department (HOSPITAL_BASED_OUTPATIENT_CLINIC_OR_DEPARTMENT_OTHER)
Admit: 2016-10-11 | Discharge: 2016-10-11 | Disposition: A | Discharge status: Home / Self Care | Payer: BLUE CROSS/BLUE SHIELD | Attending: Nurse Practitioner | Admitting: Nurse Practitioner

## 2016-10-11 ENCOUNTER — Encounter (HOSPITAL_COMMUNITY): Payer: Self-pay | Admitting: Cardiology

## 2016-10-11 ENCOUNTER — Telehealth: Payer: Self-pay | Admitting: Cardiology

## 2016-10-11 DIAGNOSIS — I509 Heart failure, unspecified: Secondary | ICD-10-CM | POA: Diagnosis not present

## 2016-10-11 DIAGNOSIS — S301XXA Contusion of abdominal wall, initial encounter: Secondary | ICD-10-CM | POA: Diagnosis not present

## 2016-10-11 DIAGNOSIS — Y9289 Other specified places as the place of occurrence of the external cause: Secondary | ICD-10-CM | POA: Insufficient documentation

## 2016-10-11 DIAGNOSIS — Z7901 Long term (current) use of anticoagulants: Secondary | ICD-10-CM | POA: Insufficient documentation

## 2016-10-11 DIAGNOSIS — Y999 Unspecified external cause status: Secondary | ICD-10-CM | POA: Insufficient documentation

## 2016-10-11 DIAGNOSIS — Y9389 Activity, other specified: Secondary | ICD-10-CM | POA: Insufficient documentation

## 2016-10-11 DIAGNOSIS — Z79899 Other long term (current) drug therapy: Secondary | ICD-10-CM | POA: Insufficient documentation

## 2016-10-11 DIAGNOSIS — Y658 Other specified misadventures during surgical and medical care: Secondary | ICD-10-CM | POA: Insufficient documentation

## 2016-10-11 DIAGNOSIS — M79609 Pain in unspecified limb: Secondary | ICD-10-CM | POA: Diagnosis not present

## 2016-10-11 DIAGNOSIS — T148XXA Other injury of unspecified body region, initial encounter: Secondary | ICD-10-CM | POA: Diagnosis not present

## 2016-10-11 DIAGNOSIS — I97410 Intraoperative hemorrhage and hematoma of a circulatory system organ or structure complicating a cardiac catheterization: Secondary | ICD-10-CM | POA: Diagnosis not present

## 2016-10-11 DIAGNOSIS — R1030 Lower abdominal pain, unspecified: Secondary | ICD-10-CM | POA: Diagnosis present

## 2016-10-11 DIAGNOSIS — I481 Persistent atrial fibrillation: Secondary | ICD-10-CM

## 2016-10-11 DIAGNOSIS — R1909 Other intra-abdominal and pelvic swelling, mass and lump: Secondary | ICD-10-CM | POA: Diagnosis not present

## 2016-10-11 LAB — CBC WITH DIFFERENTIAL/PLATELET
BASOS PCT: 0 %
Basophils Absolute: 0 10*3/uL (ref 0.0–0.1)
EOS ABS: 0 10*3/uL (ref 0.0–0.7)
EOS PCT: 0 %
HCT: 31.1 % — ABNORMAL LOW (ref 39.0–52.0)
HEMOGLOBIN: 10.6 g/dL — AB (ref 13.0–17.0)
Lymphocytes Relative: 6 %
Lymphs Abs: 0.6 10*3/uL — ABNORMAL LOW (ref 0.7–4.0)
MCH: 31.2 pg (ref 26.0–34.0)
MCHC: 34.1 g/dL (ref 30.0–36.0)
MCV: 91.5 fL (ref 78.0–100.0)
Monocytes Absolute: 0.6 10*3/uL (ref 0.1–1.0)
Monocytes Relative: 7 %
NEUTROS PCT: 87 %
Neutro Abs: 7.7 10*3/uL (ref 1.7–7.7)
PLATELETS: 138 10*3/uL — AB (ref 150–400)
RBC: 3.4 MIL/uL — AB (ref 4.22–5.81)
RDW: 14.6 % (ref 11.5–15.5)
WBC: 8.9 10*3/uL (ref 4.0–10.5)

## 2016-10-11 LAB — BASIC METABOLIC PANEL
Anion gap: 7 (ref 5–15)
BUN: 14 mg/dL (ref 6–20)
CALCIUM: 8.5 mg/dL — AB (ref 8.9–10.3)
CO2: 23 mmol/L (ref 22–32)
CREATININE: 1.06 mg/dL (ref 0.61–1.24)
Chloride: 104 mmol/L (ref 101–111)
GFR calc non Af Amer: >60 mL/min (ref >60)
Glucose, Bld: 176 mg/dL — ABNORMAL HIGH (ref 65–99)
Potassium: 4 mmol/L (ref 3.5–5.1)
SODIUM: 134 mmol/L — AB (ref 135–145)

## 2016-10-11 LAB — PROTIME-INR
INR: 1.45
PROTHROMBIN TIME: 17.8 s — AB (ref 11.4–15.2)

## 2016-10-11 LAB — TYPE AND SCREEN
ABO/RH(D): A POS
ANTIBODY SCREEN: NEGATIVE

## 2016-10-11 LAB — ABO/RH: ABO/RH(D): A POS

## 2016-10-11 MED ORDER — MAGNESIUM OXIDE 400 (241.3 MG) MG PO TABS
400.0000 mg | ORAL_TABLET | Freq: Every day | ORAL | Status: DC
  Administered 2016-10-11 – 2016-10-13 (×3): 400 mg via ORAL
  Filled 2016-10-11 (×3): qty 1

## 2016-10-11 MED ORDER — DASATINIB 50 MG PO TABS: 50.0000 mg | ORAL_TABLET | Freq: Every day | ORAL | Status: DC

## 2016-10-11 MED ORDER — OXYCODONE-ACETAMINOPHEN 5-325 MG PO TABS
1.0000 | ORAL_TABLET | Freq: Once | ORAL | Status: AC
  Administered 2016-10-11: 1 via ORAL
  Filled 2016-10-11: qty 1

## 2016-10-11 MED ORDER — LOSARTAN POTASSIUM 25 MG PO TABS
25.0000 mg | ORAL_TABLET | Freq: Every day | ORAL | Status: DC
  Administered 2016-10-12 – 2016-10-14 (×3): 25 mg via ORAL
  Filled 2016-10-11 (×3): qty 1

## 2016-10-11 MED ORDER — FENTANYL CITRATE (PF) 100 MCG/2ML IJ SOLN
25.0000 ug | INTRAMUSCULAR | Status: DC | PRN
  Administered 2016-10-11 – 2016-10-14 (×18): 25 ug via INTRAVENOUS
  Filled 2016-10-11 (×18): qty 2

## 2016-10-11 MED ORDER — FENTANYL CITRATE (PF) 100 MCG/2ML IJ SOLN
100.0000 ug | Freq: Once | INTRAMUSCULAR | Status: AC
  Administered 2016-10-11: 100 ug via INTRAVENOUS
  Filled 2016-10-11: qty 2

## 2016-10-11 MED ORDER — FUROSEMIDE 20 MG PO TABS
20.0000 mg | ORAL_TABLET | Freq: Every day | ORAL | Status: DC
  Administered 2016-10-12 – 2016-10-14 (×3): 20 mg via ORAL
  Filled 2016-10-11 (×3): qty 1

## 2016-10-11 MED ORDER — SODIUM CHLORIDE 0.9 % IV BOLUS (SEPSIS)
500.0000 mL | Freq: Once | INTRAVENOUS | Status: AC
  Administered 2016-10-11: 500 mL via INTRAVENOUS

## 2016-10-11 MED ORDER — OXYCODONE HCL 5 MG PO TABS
5.0000 mg | ORAL_TABLET | ORAL | Status: DC | PRN
  Administered 2016-10-11 – 2016-10-14 (×10): 5 mg via ORAL
  Filled 2016-10-11 (×10): qty 1

## 2016-10-11 MED ORDER — ONDANSETRON HCL 4 MG/2ML IJ SOLN
4.0000 mg | Freq: Four times a day (QID) | INTRAMUSCULAR | Status: DC | PRN
Start: 2016-10-11 — End: 2016-10-14

## 2016-10-11 MED ORDER — METOPROLOL SUCCINATE ER 100 MG PO TB24
200.0000 mg | ORAL_TABLET | Freq: Every day | ORAL | Status: DC
  Administered 2016-10-12 – 2016-10-14 (×3): 200 mg via ORAL
  Filled 2016-10-11 (×3): qty 2

## 2016-10-11 NOTE — ED Notes (Signed)
Heart healthy ordered 

## 2016-10-11 NOTE — ED Notes (Signed)
Attempted report x1. 

## 2016-10-11 NOTE — H&P (Signed)
ELECTROPHYSIOLOGY HISTORY AND PHYSICAL    Patient ID: Larry Vasquez MRN: [id number], DOB/AGE: [birth year] 66 y.o.  Admit date: 10/11/2016 Date of Consult: 10/11/2016  Primary Physician: [name], MD Primary Cardiologist: Angelena Form Electrophysiologist: Curt Bears  Reason for Consultation: right groin pain and swelling post AF ablation  HPI:  Larry Vasquez is a 66 y.o. male is referred by Dr Eulis Foster for evaluation of right groin pain and swelling post AF ablation.  Past medical history is significant for CML, and persistent atrial fibrillation/flutter s/p ablation 10/08/16 by Dr Curt Bears.  Due to medications for CML, he does not have good AAD options for AF management.  He did well initially post ablation but then developed acute pain and swelling that started earlier today.  His pain was a level "12".  Plan initially was for evaluation in AF clinic, however when triage called back, his pain had worsened and he called 911 for evaluation. He was transported to [facility].  He is hemodynamically stable at this point. He does have significant hematoma in right groin and down right leg.  He denies chest pain, shortness of breath, LE edema, recent fevers, chills, nausea or vomiting.   Past Medical History:  Diagnosis Date  . Cataract    Left eye  . CML (chronic myelocytic leukemia) ([place])   . Leukocytosis, unspecified 10/17/2012  . Thrombocytosis ([place]) 10/17/2012  . White coat hypertension 06/06/2014     Surgical History:  Past Surgical History:  Procedure Laterality Date  . ATRIAL FIBRILLATION ABLATION [name] 10/08/2016   Procedure: Atrial Fibrillation Ablation;  Surgeon: Constance Haw, MD;  Location: [facility];  Service: Cardiovascular;  Laterality: [name];  . CARDIOVERSION [name] 06/24/2016   Procedure: CARDIOVERSION;  Surgeon: [name], MD;  Location: [facility] ENDOSCOPY;  Service: Cardiovascular;  Laterality: [name];  . CATARACT EXTRACTION Left 01/01/2013  . EYE SURGERY  2012   Detached  Retina  . HERNIA REPAIR    . JOINT REPLACEMENT  2006   Total Knee Left  . TONSILLECTOMY        (Not in a hospital admission)  Inpatient Medications:   Allergies:  Allergies  Allergen Reactions  . Corticosteroids Other (See Comments)    Muscle stiffness    Social History   Social History  . Marital status: Married    Spouse name: [name]  . Number of children: [name]  . Years of education: [name]   Occupational History  . Not on file.   Social History Main Topics  . Smoking status: Never Smoker  . Smokeless tobacco: Never Used  . Alcohol use 4.2 oz/week    7 Glasses of wine per week  . Drug use: No  . Sexual activity: Yes    Birth control/ protection: None   Other Topics Concern  . Not on file   Social History Narrative  . No narrative on file     Family History  Problem Relation Age of Onset  . Arthritis Mother   . Hypertension Mother   . Diabetes Father   . Hypertension Father   . Cancer Father        bladder ca  . CAD Father        CABG  . Chronic Renal Failure Father   . Atrial fibrillation Father   . Healthy Brother   . Healthy Brother      Review of Systems: All other systems reviewed and are otherwise negative except as noted above.  Physical Exam: Vitals:   10/11/16 1320  10/11/16 1330 10/11/16 1345 10/11/16 1400  BP: (!) 150/82 139/81 128/78 127/75  Pulse: 66 69 62 69  Resp: 13 19 13 14   Temp: 98.3 F (36.8 C)     TempSrc: Oral     SpO2: 99% 96% 95% 95%  Weight: 216 lb (98 kg)     Height: 6\' 2"  (1.88 m)       GEN- The patient is well appearing, alert and oriented x 3 today.   HEENT: normocephalic, atraumatic; sclera clear, conjunctiva pink; hearing intact; oropharynx clear; neck supple  Lungs- Clear to ausculation bilaterally, normal work of breathing.  No wheezes, rales, rhonchi Heart- Regular rate and rhythm  GI- soft, non-tender, non-distended, bowel sounds present  Extremities- no clubbing, cyanosis, or edema; DP/PT/radial pulses 2+  bilaterally, +extensive hematoma right groin and right thigh MS- no significant deformity or atrophy Skin- warm and dry, no rash or lesion Psych- euthymic mood, full affect Neuro- strength and sensation are intact  Labs:  Lab Results  Component Value Date   WBC 8.9 10/11/2016   HGB 10.6 (L) 10/11/2016   HCT 31.1 (L) 10/11/2016   MCV 91.5 10/11/2016   PLT 138 (L) 10/11/2016   No results for input(s): NA, K, CL, CO2, BUN, CREATININE, CALCIUM, PROT, BILITOT, ALKPHOS, ALT, AST, GLUCOSE in the last 168 hours.  Invalid input(s): LABALBU  LGX:QJJHE bradycardia, rate 59, 1st degree AV block (personally reviewed)  TELEMETRY: sinus rhythm (personally reviewed)  Assessment/Plan: 1.  Right groin hematoma s/p AF ablation 10/08/16 Will obtain ultrasound to further evaluate Manage pain For now, will continue Eliquis until ultrasound obtained as stroke risk is highest in the 3 months following ablation.  CBC with Hgb down to 10.6. Will type and screen.  2.  Persistent atrial fibrillation/flutter Maintaining SR post ablation Eliquis as above CHADS2VASC is 1   Signed, Chanetta Marshall, NP 10/11/2016 2:15 PM  I have seen and examined this patient with Chanetta Marshall.  Agree with above, note added to reflect my findings.  On exam, RRR, no murmurs, lungs clear. Patient had AF alation on 8/3 with return to sinus rhythm. This morning, had swelling in RLE and pain. On exam, pulses present in foot but cool. Leg is tight with hematoma formation in the groin. On re-examination by NP, pulses diminished. Plan to ultrasound groin and call vascular surgery for evaluation.    Will M. Camnitz MD 10/11/2016 2:45 PM

## 2016-10-11 NOTE — ED Triage Notes (Addendum)
Pt brought in by EMS due to having increase in groin swelling and pain since Saturday. Pt had ablation for a.fib done on Friday and discharged Saturday. Pt has bruising on bilateral groins, but swelling only on right side. Pedal pulses present.

## 2016-10-11 NOTE — Consult Note (Signed)
[facility]  Patient name: Larry Vasquez MRN: [id number] DOB: January 15, 1951 Sex: male  REASON FOR CONSULT: Right groin hematoma  HPI: Larry Vasquez is a 66 y.o. male, who is in in the emergency department for evaluation of right groin hematoma. He underwent cardiac ablation via his right groin. He was doing well at home and noted increased swelling in his right groin and presented to the emergency department. He had been up walking but no strenuous activity today. Reports some pain in his right knee but this is chronic related to orthopedic issues. He notes no motor or sensory loss in his right foot. Peripheral vascular occlusive disease.  Past Medical History:  Diagnosis Date  . Cataract    Left eye  . CML (chronic myelocytic leukemia) ([place])   . Leukocytosis, unspecified 10/17/2012  . Thrombocytosis ([place]) 10/17/2012  . White coat hypertension 06/06/2014    Family History  Problem Relation Age of Onset  . Arthritis Mother   . Hypertension Mother   . Diabetes Father   . Hypertension Father   . Cancer Father        bladder ca  . CAD Father        CABG  . Chronic Renal Failure Father   . Atrial fibrillation Father   . Healthy Brother   . Healthy Brother     SOCIAL HISTORY: Social History   Social History  . Marital status: Married    Spouse name: [name]  . Number of children: [name]  . Years of education: [name]   Occupational History  . Not on file.   Social History Main Topics  . Smoking status: Never Smoker  . Smokeless tobacco: Never Used  . Alcohol use 4.2 oz/week    7 Glasses of wine per week  . Drug use: No  . Sexual activity: Yes    Birth control/ protection: None   Other Topics Concern  . Not on file   Social History Narrative  . No narrative on file    Allergies  Allergen Reactions  . Corticosteroids Other (See Comments)    Muscle stiffness    Current Facility-Administered  Medications  Medication Dose Route Frequency Provider Last Rate Last Dose  . fentaNYL (SUBLIMAZE) injection 25 mcg  25 mcg Intravenous Q1H PRN Chanetta Marshall K, NP      . oxyCODONE (Oxy IR/ROXICODONE) immediate release tablet 5 mg  5 mg Oral Q4H PRN Patsey Berthold, NP       Current Outpatient Prescriptions  Medication Sig Dispense Refill  . acetaminophen (TYLENOL) 500 MG tablet Take 1,000 mg by mouth every 6 (six) hours as needed for headache (pain).    . APPLE CIDER VINEGAR PO Take 15 mLs by mouth every other day.    . cholecalciferol (VITAMIN D) 1000 UNITS tablet Take 1,000 Units by mouth daily.     . Coenzyme Q10 (CO Q-10) 100 MG CAPS Take 100 mg by mouth daily.    . dasatinib (SPRYCEL) 50 MG tablet Take 1 tablet (50 mg total) by mouth daily. (Patient taking differently: Take 50 mg by mouth daily before supper. ) 90 tablet 11  . Digestive Enzymes CAPS Take 1 capsule by mouth daily with lunch.     Marland Kitchen ELIQUIS 5 MG TABS tablet TAKE 1 TABLET BY MOUTH TWICE A DAY 60 tablet 5  . furosemide (LASIX) 20 MG tablet Take 1 tablet (20 mg total) by mouth daily. (Patient taking differently: Take 20 mg by mouth  daily as needed for fluid or edema (leg/ankle swelling/ weight gain of >5 lbs in 24 hours). ) 30 tablet 5  . losartan (COZAAR) 25 MG tablet Take 1 tablet (25 mg total) by mouth daily. 90 tablet 3  . magnesium oxide (MAG-OX) 400 MG tablet Take 400 mg by mouth at bedtime.     . metoprolol (TOPROL-XL) 200 MG 24 hr tablet Take 1 tablet (200 mg total) by mouth daily. Take with or immediately following a meal. 90 tablet 3  . MILK THISTLE PO Take 1 capsule by mouth 4 (four) times a week.     . Multiple Vitamin (MULTIVITAMIN WITH MINERALS) TABS tablet Take 1 tablet by mouth daily.    Marland Kitchen OVER THE COUNTER MEDICATION Take 1 oz by mouth See admin instructions. ASEA dietary supplement: Drink 1 ounce by mouth up to twice daily.    . TURMERIC CURCUMIN PO Take 2-3 capsules by mouth daily.       REVIEW OF SYSTEMS:    Reviewed in his history and physical with nothing to add   PHYSICAL EXAM: Vitals:   10/11/16 1400 10/11/16 1445 10/11/16 1500 10/11/16 1515  BP: 127/75 (!) 143/77 138/76 121/76  Pulse: 69 66 62 60  Resp: 14 15 12  (!) 6  Temp:      TempSrc:      SpO2: 95% 100% 99% 98%  Weight:      Height:        GENERAL: The patient is a well-nourished male, in no acute distress. The vital signs are documented above. CARDIOVASCULAR: 2+ radial and 2+ dorsalis pedis pulses bilaterally. Bruising in his right groin with obvious swelling in his groin and medial thigh. PULMONARY: There is good air exchange  ABDOMEN: Soft and non-tender  MUSCULOSKELETAL: There are no major deformities or cyanosis. NEUROLOGIC: No focal weakness or paresthesias are detected. SKIN: There are no ulcers or rashes noted. PSYCHIATRIC: The patient has a normal affect.  DATA:  Duplex shows a large hematoma in his groin extending through the medial thigh. No evidence of flow into this area and no evidence of false aneurysm  MEDICAL ISSUES: Discussed with the patient and his wife present. Also with [place].  Has had the bleeding which is stopped. On Eliquis. No evidence of active ongoing bleeding. Would recommend admission for observation and bedrest with bathroom privileges. Discontinue anticoagulation if possible. Explained to the patient that if he continues to have evidence of ongoing bleeding would require groin exploration and evacuation of hematoma and control of bleeding from his femoral artery. I doubt that this will be required. Will follow.   Rosetta Posner, MD FACS [facility] Tel [phone] Pager [phone]

## 2016-10-11 NOTE — Telephone Encounter (Signed)
Patient wife calling, states that patient had procedure done on 10-08-16 and now his right groin area is swollen and  Hard (Mrs. Larry Vasquez states that they went through this area to get to patient heart with cath". Patient is having pain and states that the pain level is about a "12."

## 2016-10-11 NOTE — Telephone Encounter (Signed)
Spoke with pt and groin was fine this am and as morning progressed  pt states groin is tight ,hard ,warm ,and hurts if presses on sight . Per pt can only be comfortable in standing position has taken 2 ES tylenol with little relief  Now notes back pain.Discussed with Kathyrn Lass  PA whom spoke with Dr Curt Bears pt needs to be seen today.Appt made for a fib clinic today at  3:00  Called to let pt know and pt feels leg is worse is going to call 911 and go to ER for eval and tx ./cy

## 2016-10-11 NOTE — Progress Notes (Signed)
VASCULAR LAB PRELIMINARY  PRELIMINARY  PRELIMINARY  PRELIMINARY  Right groin ultrasound completed.    Preliminary report:  There is no obvious evidence of pseudoaneurysm or AV fistula.  There is large hematoma groin to mid thigh.  Romulo Okray, RVT 10/11/2016, 3:05 PM

## 2016-10-12 DIAGNOSIS — T148XXA Other injury of unspecified body region, initial encounter: Secondary | ICD-10-CM | POA: Diagnosis not present

## 2016-10-12 DIAGNOSIS — S301XXA Contusion of abdominal wall, initial encounter: Secondary | ICD-10-CM | POA: Diagnosis not present

## 2016-10-12 DIAGNOSIS — I97410 Intraoperative hemorrhage and hematoma of a circulatory system organ or structure complicating a cardiac catheterization: Secondary | ICD-10-CM | POA: Diagnosis not present

## 2016-10-12 LAB — MRSA PCR SCREENING: MRSA by PCR: NEGATIVE

## 2016-10-12 MED ORDER — SIMETHICONE 80 MG PO CHEW
80.0000 mg | CHEWABLE_TABLET | Freq: Four times a day (QID) | ORAL | Status: DC | PRN
  Administered 2016-10-13: 80 mg via ORAL
  Filled 2016-10-12: qty 1

## 2016-10-12 NOTE — Plan of Care (Signed)
Problem: Cardiovascular: Goal: Ability to achieve and maintain adequate cardiovascular perfusion will improve Outcome: Progressing Patient had a heart cath last week and his sites became ecchymotic and painful, Right groin from femoral to thigh and inner thigh to scrotal area red/purple, firm and warm to the touch but pulses palpable. Patient states that they are painful but not quite as bad as when he first came in th the hospital, Left groin is ecchymotic but relatively soft, will continue to monitor.

## 2016-10-12 NOTE — Progress Notes (Signed)
Progress Note  Patient Name: Larry Vasquez Date of Encounter: 10/12/2016  Primary Cardiologist: Larry Vasquez/Larry Vasquez  Subjective   Leg pain improved. Eager to ambulate.  Inpatient Medications    Scheduled Meds: . dasatinib  50 mg Oral Daily  . furosemide  20 mg Oral Daily  . losartan  25 mg Oral Daily  . magnesium oxide  400 mg Oral QHS  . metoprolol  200 mg Oral Daily   Continuous Infusions:  PRN Meds: fentaNYL (SUBLIMAZE) injection, ondansetron (ZOFRAN) IV, oxyCODONE   Vital Signs    Vitals:   10/11/16 1945 10/11/16 2025 10/12/16 0423 10/12/16 0800  BP: 135/78 (!) 131/92 120/68 129/65  Pulse: 73 80 90   Resp: 16 18 18    Temp:  98.6 F (37 C) 98.9 F (37.2 C) 99.4 F (37.4 C)  TempSrc:   Oral Oral  SpO2: 95% 94% 94% 93%  Weight:  216 lb 9.6 oz (98.2 kg) 215 lb 9.6 oz (97.8 kg)   Height:  6\' 2"  (1.88 m)      Intake/Output Summary (Last 24 hours) at 10/12/16 1113 Last data filed at 10/12/16 0423  Gross per 24 hour  Intake              500 ml  Output              400 ml  Net              100 ml   Filed Weights   10/11/16 1320 10/11/16 2025 10/12/16 0423  Weight: 216 lb (98 kg) 216 lb 9.6 oz (98.2 kg) 215 lb 9.6 oz (97.8 kg)    Telemetry    Sinus rhythm - Personally Reviewed  ECG    Sinus rhythm - Personally Reviewed  Physical Exam   GEN: No acute distress.   Neck: No JVD Cardiac: RRR, no murmurs, rubs, or gallops.  Respiratory: Clear to auscultation bilaterally. GI: Soft, nontender, non-distended  MS: No edema; right thigh hematoma Neuro:  Nonfocal  Psych: Normal affect   Labs    Chemistry Recent Labs Lab 10/11/16 1328  NA 134*  K 4.0  CL 104  CO2 23  GLUCOSE 176*  BUN 14  CREATININE 1.06  CALCIUM 8.5*  GFRNONAA >60  GFRAA >60  ANIONGAP 7     Hematology Recent Labs Lab 10/11/16 1328  WBC 8.9  RBC 3.40*  HGB 10.6*  HCT 31.1*  MCV 91.5  MCH 31.2  MCHC 34.1  RDW 14.6  PLT 138*    Cardiac EnzymesNo results for  input(s): TROPONINI in the last 168 hours. No results for input(s): TROPIPOC in the last 168 hours.   BNPNo results for input(s): BNP, PROBNP in the last 168 hours.   DDimer No results for input(s): DDIMER in the last 168 hours.   Radiology    No results found.  Cardiac Studies   TTE - Left ventricle: The cavity size was normal. Wall thickness was   increased in a pattern of mild LVH. Systolic function was   moderately reduced. The estimated ejection fraction was in the   range of 35% to 40%. Diffuse hypokinesis. Indeterminant diastolic   function (atrial fibrillation). - Aortic valve: There was no stenosis. There was trivial   regurgitation. - Aorta: Mildly dilated aortic root. Aortic root dimension: 41 mm   (ED). - Mitral valve: There was mild regurgitation. - Left atrium: The atrium was severely dilated. - Right ventricle: The cavity size was normal. Systolic function   was  moderately reduced. - Right atrium: The atrium was moderately to severely dilated. - Tricuspid valve: Peak RV-RA gradient (S): 23 mm Hg. - Pulmonary arteries: PA peak pressure: 38 mm Hg (S). - Systemic veins: IVC measured 2.4 cm with < 50% respirophasic   variation, suggesting RA pressure 15 mmHg. - Pericardium, extracardiac: A trivial pericardial effusion was   identified posterior to the heart.  Patient Profile     66 y.o. male with right groin pain and swelling post AF ablation.  Assessment & Plan    1.  Right groin hematoma s/p AF ablation 10/08/16 Ultrasound with active bleeding Held Eliquis last night Larry Vasquez allow to ambulate and eat today Possible discharge tomorrow per vascular surgery  2.  Persistent atrial fibrillation/flutter Maintaining sinus rhythm Eliquis CHADS2VASC is 1  Signed, Larry Stallsmith Meredith Leeds, MD  10/12/2016, 11:13 AM

## 2016-10-12 NOTE — Plan of Care (Signed)
Problem: Safety: Goal: Ability to remain free from injury will improve Outcome: Progressing Pt instructed to remain in bed and to use the call light or white phone and call RN/NT as needed for assistance and patient verbalized understanding, will continue to monitor.

## 2016-10-12 NOTE — Progress Notes (Signed)
Patient ID: Larry Vasquez, male   DOB: [birth year], 66 y.o.   MRN: [id number] Comfortable this morning. No pain in his right groin.  On physical exam he has easily palpable dorsalis pedis pulses bilaterally. Right groin is soft with no expansile mass. Mild bruising. Medial thigh soft with no tenderness.  Will feed heart healthy diet. No reason for nothing by mouth. Would recommend liberalization of mobility today. Should be okay for discharge tomorrow if no evidence of ongoing bleeding. Will follow

## 2016-10-12 NOTE — Anesthesia Postprocedure Evaluation (Signed)
Anesthesia Post Note  Patient: Larry Vasquez  Procedure(s) Performed: Procedure(s) (LRB): Atrial Fibrillation Ablation ([name])     Patient location during evaluation: Cath Lab Anesthesia Type: General Level of consciousness: awake and alert Pain management: pain level controlled Vital Signs Assessment: post-procedure vital signs reviewed and stable Respiratory status: spontaneous breathing, nonlabored ventilation, respiratory function stable and patient connected to nasal cannula oxygen Cardiovascular status: blood pressure returned to baseline and stable Postop Assessment: no signs of nausea or vomiting Anesthetic complications: no    Last Vitals:  Vitals:   10/09/16 0505 10/09/16 0839  BP: 109/66 107/73  Pulse: 62 60  Resp: 12 14  Temp: 36.6 C 36.7 C    Last Pain:  Vitals:   10/09/16 1013  TempSrc:   PainSc: 0-No pain                 Dj Senteno

## 2016-10-12 NOTE — Progress Notes (Signed)
Patient complaining of a lot of gas this evening.  Patient would like to try some gas-x.  RN text paged Cardiology with this information via Amion.

## 2016-10-12 NOTE — Plan of Care (Signed)
Problem: Cardiovascular: Goal: Vascular access site(s) Level 0-1 will be maintained Outcome: Progressing Taught pt about vascular access care. Informed pt that d/t hematoma there might be soreness. Also informed pt that bruising may occur.

## 2016-10-13 ENCOUNTER — Observation Stay (HOSPITAL_COMMUNITY): Payer: BLUE CROSS/BLUE SHIELD

## 2016-10-13 DIAGNOSIS — S301XXA Contusion of abdominal wall, initial encounter: Secondary | ICD-10-CM | POA: Diagnosis not present

## 2016-10-13 DIAGNOSIS — T148XXA Other injury of unspecified body region, initial encounter: Secondary | ICD-10-CM | POA: Diagnosis not present

## 2016-10-13 LAB — CBC WITH DIFFERENTIAL/PLATELET
BASOS ABS: 0 10*3/uL (ref 0.0–0.1)
BASOS PCT: 0 %
EOS PCT: 1 %
Eosinophils Absolute: 0.1 10*3/uL (ref 0.0–0.7)
HCT: 23.7 % — ABNORMAL LOW (ref 39.0–52.0)
Hemoglobin: 8.1 g/dL — ABNORMAL LOW (ref 13.0–17.0)
LYMPHS PCT: 18 %
Lymphs Abs: 1.3 10*3/uL (ref 0.7–4.0)
MCH: 30.8 pg (ref 26.0–34.0)
MCHC: 34.2 g/dL (ref 30.0–36.0)
MCV: 90.1 fL (ref 78.0–100.0)
MONO ABS: 1.1 10*3/uL — AB (ref 0.1–1.0)
Monocytes Relative: 15 %
NEUTROS ABS: 4.6 10*3/uL (ref 1.7–7.7)
Neutrophils Relative %: 65 %
PLATELETS: 141 10*3/uL — AB (ref 150–400)
RBC: 2.63 MIL/uL — AB (ref 4.22–5.81)
RDW: 14.2 % (ref 11.5–15.5)
WBC: 7.1 10*3/uL (ref 4.0–10.5)

## 2016-10-13 MED ORDER — IOPAMIDOL (ISOVUE-300) INJECTION 61%
INTRAVENOUS | Status: AC
  Administered 2016-10-13: 100 mL
  Filled 2016-10-13: qty 100

## 2016-10-13 NOTE — Plan of Care (Signed)
Problem: Pain Managment: Goal: General experience of comfort will improve Outcome: Progressing Patient able to rate pain using numeric pain scale as instructed by RN.  RN administered PRN pain medications(s) to decrease patient's pain score.  (See flowsheet for pain assessment/reassessment and MAR for detailed information.)

## 2016-10-13 NOTE — Progress Notes (Signed)
Called regarding CT results by radiology, discussed with Dr. [name] who as well discussed with radiologist and independently reviewed films.  No retroperitoneal bleeding., no active bleeding.  OK to introduce ambulation today and anticipate OK for discharge tomorrow.  Continue pain management.  Tommye Standard, PA-C

## 2016-10-13 NOTE — Progress Notes (Signed)
Patient ID: Larry Vasquez, male   DOB: [birth year], 66 y.o.   MRN: [id number] More uncomfortable this morning. Reports discomfort in his back.  On physical exam his abdomen is completely benign with no lower quadrant tenderness. Does continue to have swelling in his medial thigh on the right. Subjectively his groin exam appears to be more pulsatile than it had been in either the emergency department 2 days ago or yesterday.  Concerning for ongoing bleeding and even polyp possible false aneurysm formation. A CT of his abdomen and pelvis have been ordered. I have altered this to with contrast. His creatinine is normal. May have developed false aneurysm in the hematoma in his right groin. Will keep nothing by mouth. Discussed with the patient explaining he may require evacuation and repair of the artery. He continues to have 2+ right dorsalis pedis pulse

## 2016-10-13 NOTE — Discharge Summary (Signed)
ELECTROPHYSIOLOGY PROCEDURE DISCHARGE SUMMARY    Patient ID: Larry Vasquez,  MRN: [id number], DOB/AGE: 1950/12/26 66 y.o.  Admit date: 10/11/2016 Discharge date: 10/14/16  Primary Care Physician: [name], MD  Primary Cardiologist: Dr. Angelena Form Electrophysiologist: Dr. Curt Bears  Primary Discharge Diagnosis:  1. R groin/thigh hematoma  Secondary Discharge Diagnosis:  1. Persistent AFib     S/p AFib ablation 10/08/16     CHA2Ds2Vasc is 3, on Eliquis 2. CML   Allergies  Allergen Reactions  . Corticosteroids Other (See Comments)    Muscle stiffness     Procedures This Admission:  None  Brief HPI: Larry Vasquez is a 65 y.o. male was referred to the ER with sudden onset R groin pain post procedure day # after an Afib ablation, found with extensive R groin/thigh hematoma.  Hospital Course:  The patient was admitted, an US obtained noting large hematoma extending through the medial thigh, vascular services was consulted with escalating pain and diminished pulses.  Pain management was initiated.  The patient showed improvement and allowed liberalization of mobility.  The patient has some degree of chronic lumbar pain though seemed to be worsened on day #3, CT was obtained, no retroperitoneal hemorrhage. Dr. [name] reviewed findings/images, no active bleeding or aneurysm/pseudoaneurysm was felt to be present and recommended the patient be allowed to mobilize/ambulate. The patient is feeling much improved today.  He Rudolpho Claxton be sent home with 1 week of oxycodone at the direction of Dr. Curt Bears, the patient does not have any narcotic prescriptions in [facility] system.  Dr. [name] cleared the patient for discharge. To resume standard post ablation restrictions as discussed with the patient today with Dr. Curt Bears and the patient.  PT saw the patient,recommended home PT visits as well as rolling walker and 3in1 to aid in home mobility.  The patient feels the equipment Ferdie Bakken be helpful though  is comfortable with the use of the walker and 3in1, and the instructions he was given by Dr. [name] regarding advancement of mobility, prefers not to have the home PT, I have notified case management to cancel this.  The patient's telemetry remained SR, R thigh swelling continues to improve slowly.  His back pain much better, he has a component of chronic R knee pain as well.  The patient was examined by Dr. Curt Bears and considered stable for discharge to home.  Early f/u for next week has been arranged, as well as his routine post AF ablation appointments.   Physical Exam: Vitals:   10/14/16 0413 10/14/16 0419 10/14/16 0857 10/14/16 1150  BP:  (!) 135/56 102/84 (!) 133/57  Pulse:  94  81  Resp:  14 14   Temp:  98.4 F (36.9 C) 98.4 F (36.9 C) 98.2 F (36.8 C)  TempSrc:  Oral Oral Oral  SpO2:  97% 99% 98%  Weight: 215 lb 14.4 oz (97.9 kg)     Height:        GEN- The patient is well appearing, alert and oriented x 3 today.   HEENT: normocephalic, atraumatic; sclera clear, conjunctiva pink; hearing intact; oropharynx clear; neck supple, no JVP Lungs- CTA b/l , normal work of breathing.  No wheezes, rales, rhonchi Heart- RRR, no murmurs, rubs or gallops, PMI not laterally displaced GI- soft, non-tender, non-distended Extremities- R thigh remains very swollen, though not tense and is improved from admission MS- no significant deformity or atrophy Skin- warm and dry, no rash or lesion Psych- euthymic mood, full affect Neuro- no gross deficits  Labs:   Lab Results  Component Value Date   WBC 7.6 10/14/2016   HGB 7.9 (L) 10/14/2016   HCT 23.7 (L) 10/14/2016   MCV 91.5 10/14/2016   PLT 170 10/14/2016     Recent Labs Lab 10/11/16 1328  NA 134*  K 4.0  CL 104  CO2 23  BUN 14  CREATININE 1.06  CALCIUM 8.5*  GLUCOSE 176*    Discharge Medications:  Allergies as of 10/14/2016      Reactions   Corticosteroids Other (See Comments)   Muscle stiffness      Medication List     TAKE these medications   acetaminophen 500 MG tablet Commonly known as:  TYLENOL Take 1,000 mg by mouth every 6 (six) hours as needed for headache (pain).   APPLE CIDER VINEGAR PO Take 15 mLs by mouth every other day.   cholecalciferol 1000 units tablet Commonly known as:  VITAMIN D Take 1,000 Units by mouth daily.   Co Q-10 100 MG Caps Take 100 mg by mouth daily.   dasatinib 50 MG tablet Commonly known as:  SPRYCEL Take 1 tablet (50 mg total) by mouth daily. What changed:  when to take this   Digestive Enzymes Caps Take 1 capsule by mouth daily with lunch.   ELIQUIS 5 MG Tabs tablet Generic drug:  apixaban TAKE 1 TABLET BY MOUTH TWICE A DAY   furosemide 20 MG tablet Commonly known as:  LASIX Take 1 tablet (20 mg total) by mouth daily. What changed:  when to take this  reasons to take this   losartan 25 MG tablet Commonly known as:  COZAAR Take 1 tablet (25 mg total) by mouth daily.   magnesium oxide 400 MG tablet Commonly known as:  MAG-OX Take 400 mg by mouth at bedtime.   metoprolol 200 MG 24 hr tablet Commonly known as:  TOPROL-XL Take 1 tablet (200 mg total) by mouth daily. Take with or immediately following a meal.   MILK THISTLE PO Take 1 capsule by mouth 4 (four) times a week.   multivitamin with minerals Tabs tablet Take 1 tablet by mouth daily.   OVER THE COUNTER MEDICATION Take 1 oz by mouth See admin instructions. ASEA dietary supplement: Drink 1 ounce by mouth up to twice daily.   oxyCODONE 5 MG immediate release tablet Commonly known as:  Oxy IR/ROXICODONE Take 1-2 tablets (5-10 mg total) by mouth every 6 (six) hours as needed for moderate pain or severe pain.   TURMERIC CURCUMIN PO Take 2-3 capsules by mouth daily.            Durable Medical Equipment        Start     Ordered   10/14/16 1153  For home use only DME 3 n 1  Once     10/14/16 1153   10/14/16 1150  For home use only DME Walker rolling  Once    Comments:  Pain  limits gait stability  Question:  Patient needs a walker to treat with the following condition  Answer:  Gait difficulty   10/14/16 1151      Disposition: Home Discharge Instructions    Diet - low sodium heart healthy    Complete by:  As directed    Increase activity slowly    Complete by:  As directed      Follow-up Information    Early, Arvilla Meres, MD Follow up.   Specialties:  Vascular Surgery, Cardiology Why:  You Dora Clauss be contacted  by the office to schedule an appointment for the next 3 weeks Contact information: [place] [address] [phone]        [facility] Follow up.   Specialty:  Cardiology Why:  10/22/16 @ 9:30AM 11/10/16 @ 1:30PM Contact information: [address] [id number] mc [address] [place] [address] [phone]       Constance Haw, MD Follow up on 01/19/2017.   Specialty:  Cardiology Why:  9:30AM Contact information: [address] [place] [address] [place] Follow up.   Why:  [facility] Contact information: [place] [address] [place] Follow up.   Why:  3 n1 and rolling walker Contact information: [place] [address] [phone]           Duration of Discharge Encounter: Greater than 30 minutes including physician time.  Venetia Night, PA-C 10/14/2016 12:34 PM  I have seen and examined this patient with Tommye Standard.  Agree with above, note added to reflect my findings.  On exam, RRR, no murmurs, lungs clear, RLE swelling and with thigh hematoma. Has a monitor for 2 days without expansion of hematoma. CT scan yesterday showed no active bleeding. We'll plan for discharge home today with follow-up in clinic. We'll resume Eliquis tonight.    Chatara Lucente M. Siham Bucaro MD 10/14/2016 1:47 PM

## 2016-10-13 NOTE — Progress Notes (Addendum)
Pt ambulated to the bathroom with a walker. Pt sat on a chair x 3 hours.. Lower thigh circuference at 11.25 inches . Mid upper thigh at 25 inches. Will continue to monitor. Updated Renee U of continuing pain complaint.

## 2016-10-13 NOTE — Progress Notes (Signed)
Progress Note  Patient Name: Larry Vasquez Date of Encounter: 10/13/2016  Primary Cardiologist: McAlhany/Sanjeev Main  Subjective   C/w LLE pain, back pain, back pain different from his chronic back pain.  No CP, palpitations or SOB.  Patient feels there has been some dergee of improvement in the RLE swelling  Inpatient Medications    Scheduled Meds: . dasatinib  50 mg Oral Daily  . furosemide  20 mg Oral Daily  . losartan  25 mg Oral Daily  . magnesium oxide  400 mg Oral QHS  . metoprolol  200 mg Oral Daily   Continuous Infusions:  PRN Meds: fentaNYL (SUBLIMAZE) injection, ondansetron (ZOFRAN) IV, oxyCODONE, simethicone   Vital Signs    Vitals:   10/13/16 0001 10/13/16 0426 10/13/16 0800 10/13/16 0840  BP: 129/62 132/62 (!) 146/70 (!) 156/63  Pulse: 75 85 85 89  Resp: 20 20    Temp: 98.8 F (37.1 C) 98.9 F (37.2 C) 98.4 F (36.9 C)   TempSrc: Oral Oral Oral   SpO2: 97% 97% 96%   Weight:  215 lb 14.4 oz (97.9 kg)    Height:        Intake/Output Summary (Last 24 hours) at 10/13/16 0903 Last data filed at 10/13/16 0700  Gross per 24 hour  Intake              684 ml  Output              525 ml  Net              159 ml   Filed Weights   10/11/16 2025 10/12/16 0423 10/13/16 0426  Weight: 216 lb 9.6 oz (98.2 kg) 215 lb 9.6 oz (97.8 kg) 215 lb 14.4 oz (97.9 kg)    Telemetry    Sinus rhythm - Personally Reviewed  ECG    No new EKGs- Personally Reviewed  Physical Exam   GEN: No acute distress.   Neck: No JVD Cardiac: RRR, no murmurs, rubs, or gallops.  Respiratory: Clear to auscultation bilaterally. GI: Soft, nontender, non-distended, ecchymosis  MS: No edema; right thigh hematoma. RLE edema Neuro:  Nonfocal  Psych: Normal affect   Labs    Chemistry  Recent Labs Lab 10/11/16 1328  NA 134*  K 4.0  CL 104  CO2 23  GLUCOSE 176*  BUN 14  CREATININE 1.06  CALCIUM 8.5*  GFRNONAA >60  GFRAA >60  ANIONGAP 7     Hematology  Recent  Labs Lab 10/11/16 1328 10/13/16 0213  WBC 8.9 7.1  RBC 3.40* 2.63*  HGB 10.6* 8.1*  HCT 31.1* 23.7*  MCV 91.5 90.1  MCH 31.2 30.8  MCHC 34.1 34.2  RDW 14.6 14.2  PLT 138* 141*    Cardiac EnzymesNo results for input(s): TROPONINI in the last 168 hours. No results for input(s): TROPIPOC in the last 168 hours.   BNPNo results for input(s): BNP, PROBNP in the last 168 hours.   DDimer No results for input(s): DDIMER in the last 168 hours.   Radiology    No results found.  Cardiac Studies   TTE - Left ventricle: The cavity size was normal. Wall thickness was   increased in a pattern of mild LVH. Systolic function was   moderately reduced. The estimated ejection fraction was in the   range of 35% to 40%. Diffuse hypokinesis. Indeterminant diastolic   function (atrial fibrillation). - Aortic valve: There was no stenosis. There was trivial   regurgitation. - Aorta: Mildly  dilated aortic root. Aortic root dimension: 41 mm   (ED). - Mitral valve: There was mild regurgitation. - Left atrium: The atrium was severely dilated. - Right ventricle: The cavity size was normal. Systolic function   was moderately reduced. - Right atrium: The atrium was moderately to severely dilated. - Tricuspid valve: Peak RV-RA gradient (S): 23 mm Hg. - Pulmonary arteries: PA peak pressure: 38 mm Hg (S). - Systemic veins: IVC measured 2.4 cm with < 50% respirophasic   variation, suggesting RA pressure 15 mmHg. - Pericardium, extracardiac: A trivial pericardial effusion was   identified posterior to the heart.  Patient Profile     66 y.o. male with right groin pain and swelling post AF ablation.  Assessment & Plan    1.  Right groin hematoma s/p AF ablation 10/08/16 H/H continues to trend down C/o back pain CT ordered, appreciate vascular, felt exam was stable, though concerns of have developed false aneurysm in the hematoma in his right groin, may need evacuation and repair Await CT  findings  2.  Persistent atrial fibrillation/flutter Maintaining sinus rhythm Eliquis held for now [facility] is 1  Signed, Baldwin Jamaica, PA-C  10/13/2016, 9:03 AM    I have seen and examined this patient with Tommye Standard.  Agree with above, note added to reflect my findings.  On exam, RRR, no murmurs, lungs clear. Continues to have pain in his right leg as well as his back. We'll plan for CT scan of the pelvis and leg with contrast to further determine if he is having any issues. Possible evacuation and artery repair with vascular surgery.    Sandra Tellefsen M. Samona Chihuahua MD 10/13/2016 10:06 AM

## 2016-10-13 NOTE — Plan of Care (Signed)
Problem: Education: Goal: Knowledge of [facility] Education information/materials will improve Outcome: Progressing Patient aware of plan of care.  RN provided medication education on medication administered prior to administration.  Patient stated understanding.

## 2016-10-14 ENCOUNTER — Telehealth: Payer: Self-pay | Admitting: Vascular Surgery

## 2016-10-14 DIAGNOSIS — S301XXA Contusion of abdominal wall, initial encounter: Secondary | ICD-10-CM | POA: Diagnosis not present

## 2016-10-14 LAB — CBC WITH DIFFERENTIAL/PLATELET
BASOS ABS: 0 10*3/uL (ref 0.0–0.1)
BASOS PCT: 0 %
EOS ABS: 0 10*3/uL (ref 0.0–0.7)
Eosinophils Relative: 1 %
HCT: 23.7 % — ABNORMAL LOW (ref 39.0–52.0)
Hemoglobin: 7.9 g/dL — ABNORMAL LOW (ref 13.0–17.0)
Lymphocytes Relative: 18 %
Lymphs Abs: 1.3 10*3/uL (ref 0.7–4.0)
MCH: 30.5 pg (ref 26.0–34.0)
MCHC: 33.3 g/dL (ref 30.0–36.0)
MCV: 91.5 fL (ref 78.0–100.0)
MONO ABS: 1.4 10*3/uL — AB (ref 0.1–1.0)
MONOS PCT: 18 %
NEUTROS ABS: 4.9 10*3/uL (ref 1.7–7.7)
Neutrophils Relative %: 63 %
PLATELETS: 170 10*3/uL (ref 150–400)
RBC: 2.59 MIL/uL — ABNORMAL LOW (ref 4.22–5.81)
RDW: 14.4 % (ref 11.5–15.5)
WBC: 7.6 10*3/uL (ref 4.0–10.5)

## 2016-10-14 MED ORDER — OXYCODONE HCL 5 MG PO TABS: 5.0000 mg | ORAL_TABLET | Freq: Four times a day (QID) | ORAL | 0 refills | Status: AC | PRN

## 2016-10-14 NOTE — Evaluation (Signed)
Physical Therapy Evaluation Patient Details Name: Larry Vasquez MRN: [id number] DOB: [birth year] Today's Date: 10/14/2016   History of Present Illness  Pt is a 66 y/o male admitted with increased swelling and hematoma at R thigh and groin. Pt with ablation procedure on 8/3. PMH includes a fib, L cataract, CML, and thrombocytosis.   Clinical Impression  Pt admitted for problem above with deficits below. PTA, pt was independent with functional mobility. Upon eval, pt limited by swelling and pain in RLE, decreased strength, decreased ROM, and decreased balance. Pt required mod A for bed mobility to assist with RLE movement, however, required min to min guard for remainder of mobility. Pt reports wife can assist as needed upon return home. Pt concerned about toilet transfers and mobility at home, however, educated about use of 3 in 1 and energy conservation techniques. Recommending HHPT at d/c and equipment recommendations below to increase independence with functional mobility and safety. Will continue to follow acutely to maximize functional mobility independence and safety.     Follow Up Recommendations Home health PT;Supervision/Assistance - 24 hour    Equipment Recommendations  Rolling walker with 5" wheels;3in1 (PT)    Recommendations for Other Services       Precautions / Restrictions Precautions Precautions: None Restrictions Weight Bearing Restrictions: No      Mobility  Bed Mobility Overal bed mobility: Needs Assistance Bed Mobility: Supine to Sit     Supine to sit: Mod assist     General bed mobility comments: Mod A for RLE management. Educated on hook technique using LLE to assist with movement. Educated wife about how to assist pt at home.   Transfers Overall transfer level: Needs assistance Equipment used: Rolling walker (2 wheeled) Transfers: Sit to/from Stand Sit to Stand: Min assist         General transfer comment: Min A for steadying during transfer.  Increased time and effort required secondary to RLE pain and swelling. Verbal cues on LE and appropriate hand placement during transfer.   Ambulation/Gait Ambulation/Gait assistance: Min guard Ambulation Distance (Feet): 50 Feet Assistive device: Rolling walker (2 wheeled) Gait Pattern/deviations: Step-to pattern;Decreased step length - right;Decreased step length - left;Ataxic Gait velocity: Decreased Gait velocity interpretation: Below normal speed for age/gender General Gait Details: Slow, antalgic gait with decreased WB on RLE. Pt lacking full knee extension during stance phase as well. Required verbal cues for step height and weight shifting to R. Overall steady. Distance limited secondary to UE fatigue and pain in RLE.   Stairs            Wheelchair Mobility    Modified Rankin (Stroke Patients Only)       Balance Overall balance assessment: Needs assistance Sitting-balance support: No upper extremity supported;Feet supported Sitting balance-Leahy Scale: Good     Standing balance support: Bilateral upper extremity supported;During functional activity Standing balance-Leahy Scale: Poor Standing balance comment: Reliant on RW for stability                              Pertinent Vitals/Pain Pain Assessment: 0-10 Pain Score: 4  Pain Location: R knee and thigh  Pain Descriptors / Indicators: Constant;Stabbing;Sharp;Sore Pain Intervention(s): Limited activity within patient's tolerance;Monitored during session;Repositioned    Home Living Family/patient expects to be discharged to:: Private residence Living Arrangements: Spouse/significant other Available Help at Discharge: Family;Available 24 hours/day Type of Home: Other(Comment) (townhome ) Home Access: Level entry     Home Layout: One  level Home Equipment: None      Prior Function Level of Independence: Independent               Hand Dominance   Dominant Hand: Right    Extremity/Trunk  Assessment   Upper Extremity Assessment Upper Extremity Assessment: Overall WFL for tasks assessed    Lower Extremity Assessment Lower Extremity Assessment: RLE deficits/detail RLE Deficits / Details: Swelling at groin, thigh, and knee. Senosry in tact. Unable to formally assess secondary to pain and swelling. Limited active movement secondary to pain and swelling.     Cervical / Trunk Assessment Cervical / Trunk Assessment: Normal  Communication   Communication: No difficulties  Cognition Arousal/Alertness: Awake/alert Behavior During Therapy: WFL for tasks assessed/performed Overall Cognitive Status: Within Functional Limits for tasks assessed                                        General Comments General comments (skin integrity, edema, etc.): Pt's wife present. Expressed concerns about toilet transfers and mobility at home. Educated about energy conservation techniques and about 3 in 1 equipment recommendations. Also educated about HHPT given current deficits and pt agreeable.     Exercises     Assessment/Plan    PT Assessment Patient needs continued PT services  PT Problem List Decreased strength;Decreased range of motion;Decreased balance;Decreased activity tolerance;Decreased mobility;Decreased knowledge of use of DME;Decreased knowledge of precautions;Pain       PT Treatment Interventions DME instruction;Gait training;Functional mobility training;Therapeutic activities;Therapeutic exercise;Balance training;Neuromuscular re-education;Patient/family education    PT Goals (Current goals can be found in the Care Plan section)  Acute Rehab PT Goals Patient Stated Goal: to become independent  PT Goal Formulation: With patient Time For Goal Achievement: 10/21/16 Potential to Achieve Goals: Good    Frequency Min 3X/week   Barriers to discharge        Co-evaluation               AM-PAC PT "6 Clicks" Daily Activity  Outcome Measure Difficulty  turning over in bed (including adjusting bedclothes, sheets and blankets)?: Total Difficulty moving from lying on back to sitting on the side of the bed? : Total Difficulty sitting down on and standing up from a chair with arms (e.g., wheelchair, bedside commode, etc,.)?: Total Help needed moving to and from a bed to chair (including a wheelchair)?: A Little Help needed walking in hospital room?: A Little Help needed climbing 3-5 steps with a railing? : A Lot 6 Click Score: 11    End of Session Equipment Utilized During Treatment: Gait belt Activity Tolerance: Patient limited by fatigue;Patient limited by pain Patient left: in chair;with call bell/phone within reach;with family/visitor present Nurse Communication: Mobility status PT Visit Diagnosis: Other abnormalities of gait and mobility (R26.89);Difficulty in walking, not elsewhere classified (R26.2);Pain Pain - Right/Left: Right Pain - part of body: Leg    Time: 6301-6010 PT Time Calculation (min) (ACUTE ONLY): 23 min   Charges:   PT Evaluation $PT Eval Moderate Complexity: 1 Mod PT Treatments $Gait Training: 8-22 mins   PT G Codes:   PT G-Codes **NOT FOR INPATIENT CLASS** Functional Assessment Tool Used: AM-PAC 6 Clicks Basic Mobility Functional Limitation: Mobility: Walking and moving around Mobility: Walking and Moving Around Current Status (X3235): At least 60 percent but less than 80 percent impaired, limited or restricted Mobility: Walking and Moving Around Goal Status [phone]): At least 1  percent but less than 20 percent impaired, limited or restricted    Leighton Ruff, PT, DPT  Acute Rehabilitation Services  Pager: [place] 10/14/2016, 10:09 AM

## 2016-10-14 NOTE — Plan of Care (Signed)
Problem: Activity: Goal: Risk for activity intolerance will decrease Outcome: Progressing Patient ambulated to and from bathroom this morning with RN assist and front wheeled walker;  tolerated fair due to pain with ambulation.  No shortness of breath noted per RN.

## 2016-10-14 NOTE — Discharge Instructions (Signed)
No driving until cleared to. No lifting over 5 lbs for 1 week. No vigorous or sexual activity for 1 week. Keep procedure site clean & dry. If you notice increased pain, swelling, bleeding or pus, call/return!  You may shower, but no soaking baths/hot tubs/pools for 1 week.   You have an appointment set up with the [facility].  Multiple studies have shown that being followed by a dedicated atrial fibrillation clinic in addition to the standard care you receive from your other physicians improves health. We believe that enrollment in the atrial fibrillation clinic will allow [place] to better care for you.   The phone number to the [facility] is [phone]. The clinic is staffed Monday through Friday from 8:30am to 5pm.  Parking Directions: The clinic is located in the Heart and Vascular Building connected to [facility]. 1)From [address] turn on to [address] and go to the 3rd entrance  (Heart and Vascular entrance) on the right. 2)Look to the right for Heart &Vascular Parking Garage. 3)A code for the entrance is required please call the clinic to receive this.   4)Take the elevators to the 1st floor. Registration is in the room with the glass walls at the end of the hallway.  If you have any trouble parking or locating the clinic, please dont hesitate to call [phone].

## 2016-10-14 NOTE — Plan of Care (Signed)
Problem: Safety: Goal: Ability to remain free from injury will improve Pt able to ambulate safely with walker with minimal assitance

## 2016-10-14 NOTE — Plan of Care (Signed)
Problem: Education: Goal: Knowledge of [facility] Education information/materials will improve Outcome: Progressing Patient aware of plan of care.  RN provided medication education on medication(s) administered thus far this shift.  Patient stated understanding.

## 2016-10-14 NOTE — Plan of Care (Signed)
Problem: Skin Integrity: Goal: Risk for impaired skin integrity will decrease Outcome: Not Applicable Date Met: 32/20/25 Braden score greater than 18 throughout this admission per RN charting.

## 2016-10-14 NOTE — Progress Notes (Signed)
Patient ID: Larry Vasquez, male   DOB: 1950-03-26, 66 y.o.   MRN: [id number] Much more comfortable this morning. Right groin and thigh unchanged. Nonpulsatile hematoma. Discussed with Dr.Camnitz Feel comfortable with discharge and resuming anticoagulation. We'll see the patient in 3 weeks for office follow-up

## 2016-10-14 NOTE — ED Provider Notes (Signed)
[facility] DEPT Provider Note   CSN: [id number] Arrival date & time: 10/11/16  1316     History   Chief Complaint Chief Complaint  Patient presents with  . Groin Pain  . Groin Swelling    HPI Larry Vasquez is a 66 y.o. male.  He presents for evaluation of right groin pain with swelling for 3 days following a cardiac catheterization, right femoral approach, 4 days ago.  He denies shortness of breath, cough, nausea, fever, chills, weakness or dizziness.  He is able to walk.  He is on anticoagulant, chronically.  There are no other known modifying factors.  HPI  Past Medical History:  Diagnosis Date  . Cataract    Left eye  . CML (chronic myelocytic leukemia) ([place])   . Leukocytosis, unspecified 10/17/2012  . Thrombocytosis ([place]) 10/17/2012  . White coat hypertension 06/06/2014    Patient Active Problem List   Diagnosis Date Noted  . Hematoma 10/11/2016  . Status post catheter ablation of atrial flutter 10/09/2016  . Status post catheter ablation of atrial fibrillation 10/09/2016  . AF (atrial fibrillation) ([place]) 10/08/2016  . CHF (congestive heart failure) ([place]) 05/25/2016  . Atrial fibrillation, new onset ([place]) 05/25/2016  . Essential (primary) hypertension 05/25/2016  . Atrial fibrillation ([place]) 05/25/2016  . Atrial fibrillation with RVR ([place])   . Prostate cancer screening 10/22/2015  . Routine health maintenance 10/22/2015  . BPH with elevated PSA 10/16/2015  . White coat syndrome with hypertension 06/06/2014  . Spells 11/28/2013  . Leukopenia due to antineoplastic chemotherapy ([place]) 11/28/2013  . Pancytopenia due to antineoplastic chemotherapy ([place]) 08/09/2013  . 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[place]) 01/30/2013    Past Surgical History:  Procedure Laterality Date  . ATRIAL FIBRILLATION ABLATION [name] 10/08/2016   Procedure: Atrial Fibrillation Ablation;  Surgeon: Constance Haw, MD;  Location: [facility];  Service: Cardiovascular;  Laterality: [name];    . CARDIOVERSION [name] 06/24/2016   Procedure: CARDIOVERSION;  Surgeon: [name], MD;  Location: [facility] ENDOSCOPY;  Service: Cardiovascular;  Laterality: [name];  . CATARACT EXTRACTION Left 01/01/2013  . EYE SURGERY  2012   Detached Retina  . HERNIA REPAIR    . JOINT REPLACEMENT  2006   Total Knee Left  . TONSILLECTOMY         Home Medications    Prior to Admission medications   Medication Sig Start Date End Date Taking? Authorizing Provider  acetaminophen (TYLENOL) 500 MG tablet Take 1,000 mg by mouth every 6 (six) hours as needed for headache (pain).   Yes [provider]  APPLE CIDER VINEGAR PO Take 15 mLs by mouth every other day.   Yes [provider]  cholecalciferol (VITAMIN D) 1000 UNITS tablet Take 1,000 Units by mouth daily.    Yes [provider]  Coenzyme Q10 (CO Q-10) 100 MG CAPS Take 100 mg by mouth daily.   Yes [provider]  dasatinib (SPRYCEL) 50 MG tablet Take 1 tablet (50 mg total) by mouth daily. Patient taking differently: Take 50 mg by mouth daily before supper.  01/15/16  Yes Heath Lark, MD  Digestive Enzymes CAPS Take 1 capsule by mouth daily with lunch.    Yes [provider]  ELIQUIS 5 MG TABS tablet TAKE 1 TABLET BY MOUTH TWICE A DAY 07/23/16  Yes Burnell Blanks, MD  furosemide (LASIX) 20 MG tablet Take 1 tablet (20 mg total) by mouth daily. Patient taking differently: Take 20 mg by mouth daily as needed for fluid  or edema (leg/ankle swelling/ weight gain of >5 lbs in 24 hours).  05/27/16  Yes Rosita Fire, Brittainy M, PA-C  losartan (COZAAR) 25 MG tablet Take 1 tablet (25 mg total) by mouth daily. 08/13/16  Yes Lyda Jester M, PA-C  magnesium oxide (MAG-OX) 400 MG tablet Take 400 mg by mouth at bedtime.    Yes [provider]  metoprolol (TOPROL-XL) 200 MG 24 hr tablet Take 1 tablet (200 mg total) by mouth daily. Take with or immediately following a meal. 09/10/16  Yes Camnitz, Will Hassell Done, MD  MILK  THISTLE PO Take 1 capsule by mouth 4 (four) times a week.    Yes [provider]  Multiple Vitamin (MULTIVITAMIN WITH MINERALS) TABS tablet Take 1 tablet by mouth daily.   Yes [provider]  OVER THE COUNTER MEDICATION Take 1 oz by mouth See admin instructions. ASEA dietary supplement: Drink 1 ounce by mouth up to twice daily.   Yes [provider]  TURMERIC CURCUMIN PO Take 2-3 capsules by mouth daily.    Yes [provider]  oxyCODONE (OXY IR/ROXICODONE) 5 MG immediate release tablet Take 1-2 tablets (5-10 mg total) by mouth every 6 (six) hours as needed for moderate pain or severe pain. 10/14/16 10/21/16  Baldwin Jamaica, PA-C    Family History Family History  Problem Relation Age of Onset  . Arthritis Mother   . Hypertension Mother   . Diabetes Father   . Hypertension Father   . Cancer Father        bladder ca  . CAD Father        CABG  . Chronic Renal Failure Father   . Atrial fibrillation Father   . Healthy Brother   . Healthy Brother     Social History Social History  Substance Use Topics  . Smoking status: Never Smoker  . Smokeless tobacco: Never Used  . Alcohol use 4.2 oz/week    7 Glasses of wine per week     Allergies   Corticosteroids   Review of Systems Review of Systems  All other systems reviewed and are negative.    Physical Exam Updated Vital Signs BP (!) 133/57 (BP Location: Left Arm)   Pulse 81   Temp 98.2 F (36.8 C) (Oral)   Resp 14   Ht 6\' 2"  (1.88 m)   Wt 97.9 kg (215 lb 14.4 oz)   SpO2 98%   BMI 27.72 kg/m   Physical Exam  Constitutional: He is oriented to person, place, and time. He appears well-developed and well-nourished. No distress.  HENT:  Head: Normocephalic and atraumatic.  Right Ear: External ear normal.  Left Ear: External ear normal.  Eyes: Pupils are equal, round, and reactive to light. Conjunctivae and EOM are normal.  Neck: Normal range of motion and phonation normal. Neck  supple.  Cardiovascular: Normal rate and regular rhythm.   Pulmonary/Chest: Effort normal. He exhibits no bony tenderness.  Abdominal: Soft. There is no tenderness.  Right groin mass, consistent with hematoma, moderately tender.  No pulsatile component.  No overlying skin breakdown.  Musculoskeletal: Normal range of motion.  Normal range of motion right leg.  Right leg warm to touch.  Right leg sensate.  Neurological: He is alert and oriented to person, place, and time. No cranial nerve deficit or sensory deficit. He exhibits normal muscle tone. Coordination normal.  Skin: Skin is warm, dry and intact.  Psychiatric: He has a normal mood and affect. His behavior is normal. Judgment and  thought content normal.  Nursing note and vitals reviewed.    ED Treatments / Results  Labs (all labs ordered are listed, but only abnormal results are displayed) Labs Reviewed  BASIC METABOLIC PANEL - Abnormal; Notable for the following:       Result Value   Sodium 134 (*)    Glucose, Bld 176 (*)    Calcium 8.5 (*)    All other components within normal limits  CBC WITH DIFFERENTIAL/PLATELET - Abnormal; Notable for the following:    RBC 3.40 (*)    Hemoglobin 10.6 (*)    HCT 31.1 (*)    Platelets 138 (*)    Lymphs Abs 0.6 (*)    All other components within normal limits  PROTIME-INR - Abnormal; Notable for the following:    Prothrombin Time 17.8 (*)    All other components within normal limits  CBC WITH DIFFERENTIAL/PLATELET - Abnormal; Notable for the following:    RBC 2.63 (*)    Hemoglobin 8.1 (*)    HCT 23.7 (*)    Platelets 141 (*)    Monocytes Absolute 1.1 (*)    All other components within normal limits  CBC WITH DIFFERENTIAL/PLATELET - Abnormal; Notable for the following:    RBC 2.59 (*)    Hemoglobin 7.9 (*)    HCT 23.7 (*)    Monocytes Absolute 1.4 (*)    All other components within normal limits  MRSA PCR SCREENING  TYPE AND SCREEN  ABO/RH    EKG  EKG  Interpretation None       Radiology Ct Abdomen Pelvis W Contrast  Result Date: 10/13/2016 CLINICAL DATA:  Right-sided groin and back pain following cardiac procedure EXAM: CT ABDOMEN AND PELVIS WITH CONTRAST TECHNIQUE: Multidetector CT imaging of the abdomen and pelvis was performed using the standard protocol following bolus administration of intravenous contrast. CONTRAST:  139mL ISOVUE-300 IOPAMIDOL (ISOVUE-300) INJECTION 61% COMPARISON:  None. FINDINGS: Lower chest: Lung bases demonstrate bibasilar atelectatic changes worse on the right than the left with a moderate right-sided pleural effusion. No definitive pericardial effusion is seen. Hepatobiliary: No focal liver abnormality is seen. No gallstones, gallbladder wall thickening, or biliary dilatation. Pancreas: Unremarkable. No pancreatic ductal dilatation or surrounding inflammatory changes. Spleen: Normal in size without focal abnormality. Adrenals/Urinary Tract: The adrenal glands are within normal limits. The kidneys are well visualized bilaterally with a tiny nonobstructing renal stone in the upper pole of the left kidney. The bladder is well distended. Stomach/Bowel: Diverticular change of the colon is seen without evidence of diverticulitis. The appendix is within normal limits. No obstructive changes are seen. Vascular/Lymphatic: Aortic atherosclerosis. No enlarged abdominal or pelvic lymph nodes. Reproductive: Prostate is prominent indenting upon the inferior aspect of the urinary bladder. Other: There is a large muscular hematoma involving the medial aspect of the right thigh. It involves primarily the adductor muscles on the right with some heterogeneous appearance consistent with varying ages of hemorrhage. The hematoma measures approximately 14.6 x 12.8 cm in greatest transverse and AP dimensions respectively. It extends for at least 19 cm in craniocaudad projection and causes some mass effect along the adjacent right superficial femoral  vein. No significant mass effect upon the superficial femoral artery is noted. No focal pooling of contrast material is noted within the hematoma to suggest active extravasation. A small 1 cm collection of contrast is noted anterior to the hematoma within the subcutaneous fat best seen on image number 89 of series 3. A few small superficial arterial branches are  noted adjacent to this collection and this may represent a small pseudoaneurysm. This does not arises directly from the common femoral or superficial femoral artery but likely from a small muscular perforating branch. Mild subcutaneous edema is noted in the inferior right thigh and likely related to the venous compression. No findings to suggest deep venous thrombosis are noted. Musculoskeletal: Degenerative changes of the lumbar spine are seen. No acute bony abnormality is seen. IMPRESSION: Changes consistent with a muscular hematoma in the medial aspect of the right thigh involving the adductor muscles. No active extravasation within the hematoma is noted. A 1 cm focus of contrast is noted in the subcutaneous tissues of the anterior right thigh. This may represent a small pseudoaneurysm of a perforating artery. No pseudoaneurysm arising from the common femoral artery is seen. Tiny nonobstructing left renal stone. Bibasilar atelectatic changes right greater than left with moderate right pleural effusion. These results were called by telephone at the time of interpretation on 10/13/2016 at 12:27 pm to Tommye Standard, PA who verbally acknowledged these results. Electronically Signed   By: Inez Catalina M.D.   On: 10/13/2016 12:28    Procedures Procedures (including critical care time)  Medications Ordered in ED Medications  sodium chloride 0.9 % bolus 500 mL (0 mLs Intravenous Stopped 10/11/16 1504)  fentaNYL (SUBLIMAZE) injection 100 mcg (100 mcg Intravenous Given 10/11/16 1412)  oxyCODONE-acetaminophen (PERCOCET/ROXICET) 5-325 MG per tablet 1 tablet (1  tablet Oral Given 10/11/16 1412)  iopamidol (ISOVUE-300) 61 % injection (100 mLs  Contrast Given 10/13/16 1122)     Initial Impression / Assessment and Plan / ED Course  I have reviewed the triage vital signs and the nursing notes.  Pertinent labs & imaging results that were available during my care of the patient were reviewed by me and considered in my medical decision making (see chart for details).      Patient Vitals for the past 24 hrs:  BP Temp Temp src Pulse Resp SpO2 Weight  10/14/16 1150 (!) 133/57 98.2 F (36.8 C) Oral 81 - 98 % -  10/14/16 0857 102/84 98.4 F (36.9 C) Oral - 14 99 % -  10/14/16 0419 (!) 135/56 98.4 F (36.9 C) Oral 94 14 97 % -  10/14/16 0413 - - - - - - 97.9 kg (215 lb 14.4 oz)  10/13/16 2357 (!) 147/57 99.8 F (37.7 C) Oral 92 14 97 % -    Consult cardiology who came to the ED, and admitted the patient.   Final Clinical Impressions(s) / ED Diagnoses   Final diagnoses:  Hematoma of groin, initial encounter   Postoperative groin hematoma, likely secondary to minor trauma with anticoagulation status.  Nursing Notes Reviewed/ Care Coordinated Applicable Imaging Reviewed Interpretation of Laboratory Data incorporated into ED treatment  Plan: Admit  New Prescriptions Discharge Medication List as of 10/14/2016  1:32 PM    START taking these medications   Details  oxyCODONE (OXY IR/ROXICODONE) 5 MG immediate release tablet Take 1-2 tablets (5-10 mg total) by mouth every 6 (six) hours as needed for moderate pain or severe pain., Starting Thu 10/14/2016, Until Thu 10/21/2016, Print         Daleen Bo, MD 10/14/16 2347

## 2016-10-14 NOTE — Telephone Encounter (Signed)
-----   Message from Mena Goes, RN sent at 10/14/2016 10:40 AM EDT ----- Regarding: 2 weeks  10-28-16 per TFE   ----- Message ----- From: Rosetta Posner, MD Sent: 10/14/2016  10:27 AM To: [facility] Clinical Pool, [facility] Charge Pool  Need an office visit in 2 weeks. Can see him on the vein day on August 23. Does not need vascular lab

## 2016-10-14 NOTE — Care Management Note (Signed)
Case Management Note  Patient Details  Name: Larry Vasquez MRN: [id number] Date of Birth: 01/14/1951  Subjective/Objective:  From home with wife, for discharge today, will need HHPT orders and DME orders.  NCM offered choice to patient for [facility] services he chose [facility]. Referral made to [facility] for HHPT and 3 n 1 and rolling walker. Soc will begin 24-48 hrs post dc.   8/9 1239 Larry Bamberger RN BSN- NCM received call from [facility] PA, she states patient has decided he does not want HHPT, Larry Vasquez with [facility] notified.                                  Action/Plan:   Expected Discharge Date:  10/14/16               Expected Discharge Plan:  [place]  In-House Referral:     Discharge planning Services  CM Consult  Post Acute Care Choice:  Durable Medical Equipment, Home Health Choice offered to:  Patient  DME Arranged:  3-N-1, Walker rolling DME Agency:  [place]:  PT, Patient Refused [place] Agency:  [place]  Status of Service:  Completed, signed off  If discussed at [place] of Stay Meetings, dates discussed:    Additional Comments:  Larry Mayo, RN 10/14/2016, 12:39 PM

## 2016-10-14 NOTE — Care Management Note (Signed)
Case Management Note  Patient Details  Name: Calloway Andrus MRN: [id number] Date of Birth: Sep 22, 1950  Subjective/Objective:  From home with with, for discharge today, will need HHPT orders and DME orders.  NCM offered choice to patient for [facility] services he chose [facility]. Referral made to [facility] for HHPT and 3 n 1 and rolling walker. Soc will begin 24-48 hrs post dc.                  Action/Plan: NCM will follow for dc needs.   Expected Discharge Date:                  Expected Discharge Plan:  [place]  In-House Referral:     Discharge planning Services  CM Consult  Post Acute Care Choice:  Durable Medical Equipment, Home Health Choice offered to:  Patient  DME Arranged:  3-N-1, Walker rolling DME Agency:  [place]:  PT [place]:  [place]  Status of Service:  Completed, signed off  If discussed at [place] of Stay Meetings, dates discussed:    Additional Comments:  Zenon Mayo, RN 10/14/2016, 11:28 AM

## 2016-10-14 NOTE — Telephone Encounter (Signed)
Sched appt 10/28/16 at 11:30. Lm on cell#.

## 2016-10-18 ENCOUNTER — Telehealth: Payer: Self-pay

## 2016-10-18 NOTE — Telephone Encounter (Signed)
Pt called in and reported an increase in tightness and swelling of R inner thigh and foot also reported no BM in 8 days with no pain just gas. Pt reported no fever, no drainage, slight pain, decrease in size and feels soft to touch. Pt stated he had not been keeping his leg elevated above his heart. Advised him to elevate his leg above the heart anytime he is sitting or lying. Also advised him to increase his fluid intake while eating a fiber rich diet to help with BM. Advised that if he begins to show signs of fever, have increased pain, or cold foot to call our office. He verbalized understanding and agrees with this plan.

## 2016-10-19 ENCOUNTER — Encounter: Payer: Self-pay | Admitting: Vascular Surgery

## 2016-10-22 ENCOUNTER — Encounter (HOSPITAL_COMMUNITY): Payer: Self-pay | Admitting: Nurse Practitioner

## 2016-10-22 ENCOUNTER — Ambulatory Visit (HOSPITAL_COMMUNITY)
Admit: 2016-10-22 | Discharge: 2016-10-22 | Disposition: A | Discharge status: Home / Self Care | Payer: BLUE CROSS/BLUE SHIELD | Attending: Nurse Practitioner | Admitting: Nurse Practitioner

## 2016-10-22 VITALS — BP 148/76 | HR 67 | Ht 74.0 in | Wt 214.0 lb

## 2016-10-22 DIAGNOSIS — C9210 Chronic myeloid leukemia, BCR/ABL-positive, not having achieved remission: Secondary | ICD-10-CM | POA: Insufficient documentation

## 2016-10-22 DIAGNOSIS — N501 Vascular disorders of male genital organs: Secondary | ICD-10-CM | POA: Diagnosis not present

## 2016-10-22 DIAGNOSIS — Z9889 Other specified postprocedural states: Secondary | ICD-10-CM | POA: Diagnosis not present

## 2016-10-22 DIAGNOSIS — K5900 Constipation, unspecified: Secondary | ICD-10-CM | POA: Insufficient documentation

## 2016-10-22 DIAGNOSIS — I10 Essential (primary) hypertension: Secondary | ICD-10-CM | POA: Diagnosis not present

## 2016-10-22 DIAGNOSIS — I4891 Unspecified atrial fibrillation: Secondary | ICD-10-CM | POA: Diagnosis not present

## 2016-10-22 DIAGNOSIS — T148XXA Other injury of unspecified body region, initial encounter: Secondary | ICD-10-CM | POA: Diagnosis not present

## 2016-10-22 DIAGNOSIS — Z8249 Family history of ischemic heart disease and other diseases of the circulatory system: Secondary | ICD-10-CM | POA: Diagnosis not present

## 2016-10-22 NOTE — Progress Notes (Signed)
Primary Care Physician: [name], MD Referring Physician:Dr. Genella Mech Begue is a 66 y.o. male with a h/o recent Afib ablation with complication of rt groin hematoma. He was followed in hospital by Dr. [name] and has f/u with him 8/23.Pt overall feels that the leg is improving and rt thigh is softer but now has increased swelling of rt lower leg and foot. He denies any pain in the leg, + soreness.No fever or chills. The leg is without new bruising. Bruising is fading along inner thigh and inner knee area. No redness or increased warmth. He has not noted any afib. Heart rate  regular today.He is back on xarelto.He is also c/o constipation probably 2/2 narcotic, oxycodone that he was d/c on and has now finished.  Today, he denies symptoms of palpitations, chest pain, shortness of breath, orthopnea, PND, dizziness, presyncope, syncope, or neurologic sequela. Positive for Rt LLE old bruising. The patient is tolerating medications without difficulties and is otherwise without complaint today.   Past Medical History:  Diagnosis Date  . Cataract    Left eye  . CML (chronic myelocytic leukemia) ([place])   . Leukocytosis, unspecified 10/17/2012  . Thrombocytosis ([place]) 10/17/2012  . White coat hypertension 06/06/2014   Past Surgical History:  Procedure Laterality Date  . ATRIAL FIBRILLATION ABLATION [name] 10/08/2016   Procedure: Atrial Fibrillation Ablation;  Surgeon: Constance Haw, MD;  Location: [place] CV LAB;  Service: Cardiovascular;  Laterality: [name];  . CARDIOVERSION [name] 06/24/2016   Procedure: CARDIOVERSION;  Surgeon: [name], MD;  Location: [facility] ENDOSCOPY;  Service: Cardiovascular;  Laterality: [name];  . CATARACT EXTRACTION Left 01/01/2013  . EYE SURGERY  2012   Detached Retina  . HERNIA REPAIR    . JOINT REPLACEMENT  2006   Total Knee Left  . TONSILLECTOMY      Current Outpatient Prescriptions  Medication Sig Dispense Refill  . acetaminophen (TYLENOL) 500 MG  tablet Take 1,000 mg by mouth every 6 (six) hours as needed for headache (pain).    . cholecalciferol (VITAMIN D) 1000 UNITS tablet Take 1,000 Units by mouth daily.     . Coenzyme Q10 (CO Q-10) 100 MG CAPS Take 100 mg by mouth daily.    . dasatinib (SPRYCEL) 50 MG tablet Take 1 tablet (50 mg total) by mouth daily. (Patient taking differently: Take 50 mg by mouth daily before supper. ) 90 tablet 11  . Digestive Enzymes CAPS Take 1 capsule by mouth daily with lunch.     Marland Kitchen ELIQUIS 5 MG TABS tablet TAKE 1 TABLET BY MOUTH TWICE A DAY 60 tablet 5  . furosemide (LASIX) 20 MG tablet Take 1 tablet (20 mg total) by mouth daily. (Patient taking differently: Take 20 mg by mouth daily as needed for fluid or edema (leg/ankle swelling/ weight gain of >5 lbs in 24 hours). ) 30 tablet 5  . losartan (COZAAR) 25 MG tablet Take 1 tablet (25 mg total) by mouth daily. 90 tablet 3  . magnesium oxide (MAG-OX) 400 MG tablet Take 400 mg by mouth at bedtime.     . metoprolol (TOPROL-XL) 200 MG 24 hr tablet Take 1 tablet (200 mg total) by mouth daily. Take with or immediately following a meal. 90 tablet 3  . MILK THISTLE PO Take 1 capsule by mouth 4 (four) times a week.     . Multiple Vitamin (MULTIVITAMIN WITH MINERALS) TABS tablet Take 1 tablet by mouth daily.    Marland Kitchen OVER THE COUNTER  MEDICATION Take 1 oz by mouth See admin instructions. ASEA dietary supplement: Drink 1 ounce by mouth up to twice daily.    . TURMERIC CURCUMIN PO Take 2-3 capsules by mouth daily.     . APPLE CIDER VINEGAR PO Take 15 mLs by mouth every other day.     No current facility-administered medications for this encounter.     Allergies  Allergen Reactions  . Corticosteroids Other (See Comments)    Muscle stiffness    Social History   Social History  . Marital status: Married    Spouse name: [name]  . Number of children: [name]  . Years of education: [name]   Occupational History  . Not on file.   Social History Main Topics  . Smoking status:  Never Smoker  . Smokeless tobacco: Never Used  . Alcohol use 4.2 oz/week    7 Glasses of wine per week  . Drug use: No  . Sexual activity: Yes    Birth control/ protection: None   Other Topics Concern  . Not on file   Social History Narrative  . No narrative on file    Family History  Problem Relation Age of Onset  . Arthritis Mother   . Hypertension Mother   . Diabetes Father   . Hypertension Father   . Cancer Father        bladder ca  . CAD Father        CABG  . Chronic Renal Failure Father   . Atrial fibrillation Father   . Healthy Brother   . Healthy Brother     ROS- All systems are reviewed and negative except as per the HPI above  Physical Exam: Vitals:   10/22/16 0923  BP: (!) 148/76  Pulse: 67  SpO2: 100%  Weight: 214 lb (97.1 kg)  Height: 6\' 2"  (1.88 m)   Wt Readings from Last 3 Encounters:  10/22/16 214 lb (97.1 kg)  10/14/16 215 lb 14.4 oz (97.9 kg)  10/09/16 212 lb 9.6 oz (96.4 kg)    Labs: Lab Results  Component Value Date   NA 134 (L) 10/11/2016   K 4.0 10/11/2016   CL 104 10/11/2016   CO2 23 10/11/2016   GLUCOSE 176 (H) 10/11/2016   BUN 14 10/11/2016   CREATININE 1.06 10/11/2016   CALCIUM 8.5 (L) 10/11/2016   MG 2.1 05/25/2016   Lab Results  Component Value Date   INR 1.45 10/11/2016   No results found for: CHOL, HDL, LDLCALC, TRIG   GEN- The patient is well appearing, alert and oriented x 3 today.   Head- normocephalic, atraumatic Eyes-  Sclera clear, conjunctiva pink Ears- hearing intact Oropharynx- clear Neck- supple, no JVP Lymph- no cervical lymphadenopathy Lungs- Clear to ausculation bilaterally, normal work of breathing Heart- Regular rate and rhythm, no murmurs, rubs or gallops, PMI not laterally displaced GI- soft, NT, ND, + BS Extremities- no clubbing, cyanosis, or edema on Left. Rt LE with fading bruising of rt thigh and less edema of thigh per pt but still edematous. Rt LE now with increased swelling from knee to  foot area.. Cannot palpate pulse due to edema but warm to touch MS- no significant deformity or atrophy Skin- no rash or lesion Psych- euthymic mood, full affect Neuro- strength and sensation are intact  EKG-none today    Assessment and Plan: 1. Afib S/p ablation 8/7 Appears to be staying in [place]  2. Rt groin hematoma Pt overall feels that the leg  is improving although slowly Spoke to Dr. [name] re less edema of thigh but with more edema of rt leg/foot He feels this is gravity moving fluid into lower leg He feels that it will take time to absorb edema Recommended ace for LE which was wrapped foot to calf When sitting elevate rt leg  3. Constipation  Now off narcotics Miralax, hot prune juice  F/u with Dr. [name] 8/16  Geroge Baseman. [place], [facility] [address] [place], [place] [address] [phone]

## 2016-10-28 ENCOUNTER — Encounter: Payer: Self-pay | Admitting: Vascular Surgery

## 2016-10-28 ENCOUNTER — Ambulatory Visit (INDEPENDENT_AMBULATORY_CARE_PROVIDER_SITE_OTHER): Payer: BLUE CROSS/BLUE SHIELD | Admitting: Vascular Surgery

## 2016-10-28 VITALS — BP 154/77 | HR 51 | Temp 97.8°F | Resp 20 | Ht 74.0 in | Wt 211.5 lb

## 2016-10-28 DIAGNOSIS — I97410 Intraoperative hemorrhage and hematoma of a circulatory system organ or structure complicating a cardiac catheterization: Secondary | ICD-10-CM

## 2016-10-28 DIAGNOSIS — I868 Varicose veins of other specified sites: Secondary | ICD-10-CM

## 2016-10-28 NOTE — Progress Notes (Signed)
Vascular and Vein Specialist of [facility]  Patient name: Larry Vasquez MRN: [id number] DOB: 1950/09/07 Sex: male  REASON FOR VISIT: Follow-up hematoma in his right groin.  HPI: Larry Vasquez is a 66 y.o. male here for follow-up. I'd seen him in the hospital. He had undergone ablation of cardiac rhythm disturbance. He was discharged uneventfully and then presented back to the emergency room with the significant bleed into his right groin and right thigh. Of interest the only had a venous puncture. He was discharged home on oral anticoagulation. Duplex on his presentation showed no evidence of false aneurysm. There was a large groin hematoma with no suggestion of retroperitoneal bleeding. He was admitted overnight and was stable following morning. He then had more discomfort in his lower back which felt to potentially be related to degenerative disc disease. Due to this he underwent a CT scan which showed no evidence of breath peritoneal bleed. He did have a large amount of blood in his medial thigh musculature and subcutaneous tissue. There is no evidence of femoral artery false aneurysm. He was eventually discharged home with no difficulty. He presents today for follow-up. He does have a significant amount of swelling throughout his entire leg pain extending down into his calf and onto his foot. No significant swelling in his left leg.  Past Medical History:  Diagnosis Date  . Cataract    Left eye  . CML (chronic myelocytic leukemia) ([place])   . Leukocytosis, unspecified 10/17/2012  . Thrombocytosis ([place]) 10/17/2012  . White coat hypertension 06/06/2014    Family History  Problem Relation Age of Onset  . Arthritis Mother   . Hypertension Mother   . Diabetes Father   . Hypertension Father   . Cancer Father        bladder ca  . CAD Father        CABG  . Chronic Renal Failure Father   . Atrial fibrillation Father   . Healthy Brother   . Healthy  Brother     SOCIAL HISTORY: Social History  Substance Use Topics  . Smoking status: Never Smoker  . Smokeless tobacco: Never Used  . Alcohol use 4.2 oz/week    7 Glasses of wine per week    Allergies  Allergen Reactions  . Corticosteroids Other (See Comments)    Muscle stiffness    Current Outpatient Prescriptions  Medication Sig Dispense Refill  . acetaminophen (TYLENOL) 500 MG tablet Take 1,000 mg by mouth every 6 (six) hours as needed for headache (pain).    . APPLE CIDER VINEGAR PO Take 15 mLs by mouth every other day.    . cholecalciferol (VITAMIN D) 1000 UNITS tablet Take 1,000 Units by mouth daily.     . Coenzyme Q10 (CO Q-10) 100 MG CAPS Take 100 mg by mouth daily.    . dasatinib (SPRYCEL) 50 MG tablet Take 1 tablet (50 mg total) by mouth daily. (Patient taking differently: Take 50 mg by mouth daily before supper. ) 90 tablet 11  . Digestive Enzymes CAPS Take 1 capsule by mouth daily with lunch.     Marland Kitchen ELIQUIS 5 MG TABS tablet TAKE 1 TABLET BY MOUTH TWICE A DAY 60 tablet 5  . furosemide (LASIX) 20 MG tablet Take 1 tablet (20 mg total) by mouth daily. (Patient taking differently: Take 20 mg by mouth daily as needed for fluid or edema (leg/ankle swelling/ weight gain of >5 lbs in 24 hours). ) 30 tablet 5  . losartan (COZAAR) 25  MG tablet Take 1 tablet (25 mg total) by mouth daily. 90 tablet 3  . magnesium oxide (MAG-OX) 400 MG tablet Take 400 mg by mouth at bedtime.     . metoprolol (TOPROL-XL) 200 MG 24 hr tablet Take 1 tablet (200 mg total) by mouth daily. Take with or immediately following a meal. 90 tablet 3  . MILK THISTLE PO Take 1 capsule by mouth 4 (four) times a week.     . Multiple Vitamin (MULTIVITAMIN WITH MINERALS) TABS tablet Take 1 tablet by mouth daily.    Marland Kitchen OVER THE COUNTER MEDICATION Take 1 oz by mouth See admin instructions. ASEA dietary supplement: Drink 1 ounce by mouth up to twice daily.    . TURMERIC CURCUMIN PO Take 2-3 capsules by mouth daily.      No  current facility-administered medications for this visit.     REVIEW OF SYSTEMS:  [X]  denotes positive finding, [ ]  denotes negative finding Cardiac  Comments:  Chest pain or chest pressure:    Shortness of breath upon exertion:    Short of breath when lying flat:    Irregular heart rhythm: x       Vascular    Pain in calf, thigh, or hip brought on by ambulation:    Pain in feet at night that wakes you up from your sleep:     Blood clot in your veins:    Leg swelling:  x         PHYSICAL EXAM: Vitals:   10/28/16 1129 10/28/16 1130  BP: (!) 160/80 (!) 154/77  Pulse: (!) 51   Resp: 20   Temp: 97.8 F (36.6 C)   TempSrc: Oral   SpO2: 100%   Weight: 211 lb 8 oz (95.9 kg)   Height: 6\' 2"  (1.88 m)     GENERAL: The patient is a well-nourished male, in no acute distress. The vital signs are documented above. CARDIOVASCULAR: Normal right femoral pulse. Significant swelling throughout his entire right leg with thickening from the hematoma in his right groin and right medial thigh. PULMONARY: There is good air exchange  MUSCULOSKELETAL: There are no major deformities or cyanosis. NEUROLOGIC: No focal weakness or paresthesias are detected. SKIN: There are no ulcers or rashes noted. PSYCHIATRIC: The patient has a normal affect.  DATA:  I did image his groin with SonoSite ultrasound. This shows no evidence of false aneurysm in his common femoral artery and no evidence of injury to his femoral vein. His common femoral vein was patent with no evidence of DVT by SonoSite. His large hematoma in the medial thigh and groin were easily seen by SonoSite as well.  MEDICAL ISSUES: I reviewed his CT scan with the patient and his wife from his hospitalization. Showed him the great deal of blood that he had in his medial thigh musculature. I feel that he is slowly resolving all of this and explained that it may take another several months to completely resolve. I have recommended that he wear her  knee-high graduated compression stockings and we have fitted him with this today. I did discuss the option of a formal venous duplex but do not see any evidence of clot in his common femoral vein and explained that there would be no change in therapy. I have a low suspicion for DVT and his treatment would not change since he is on full anticoagulation currently. He is comfortable with this discussion and will see [place] again on an as-needed basis  Rosetta Posner, MD FACS Vascular and Vein Specialists of [facility] Tel [phone] Pager [phone]

## 2016-11-05 ENCOUNTER — Ambulatory Visit (HOSPITAL_COMMUNITY): Payer: BLUE CROSS/BLUE SHIELD

## 2016-11-10 ENCOUNTER — Encounter (HOSPITAL_COMMUNITY): Payer: Self-pay | Admitting: Nurse Practitioner

## 2016-11-10 ENCOUNTER — Ambulatory Visit (HOSPITAL_COMMUNITY)
Admission: RE | Admit: 2016-11-10 | Discharge: 2016-11-10 | Disposition: A | Discharge status: Home / Self Care | Payer: BLUE CROSS/BLUE SHIELD | Source: Ambulatory Visit | Attending: Nurse Practitioner | Admitting: Nurse Practitioner

## 2016-11-10 VITALS — BP 122/62 | HR 50 | Ht 74.0 in | Wt 205.0 lb

## 2016-11-10 DIAGNOSIS — Z9889 Other specified postprocedural states: Secondary | ICD-10-CM | POA: Insufficient documentation

## 2016-11-10 DIAGNOSIS — Z8052 Family history of malignant neoplasm of bladder: Secondary | ICD-10-CM | POA: Diagnosis not present

## 2016-11-10 DIAGNOSIS — Z841 Family history of disorders of kidney and ureter: Secondary | ICD-10-CM | POA: Diagnosis not present

## 2016-11-10 DIAGNOSIS — I10 Essential (primary) hypertension: Secondary | ICD-10-CM | POA: Insufficient documentation

## 2016-11-10 DIAGNOSIS — I481 Persistent atrial fibrillation: Secondary | ICD-10-CM | POA: Diagnosis not present

## 2016-11-10 DIAGNOSIS — Z888 Allergy status to other drugs, medicaments and biological substances status: Secondary | ICD-10-CM | POA: Diagnosis not present

## 2016-11-10 DIAGNOSIS — I4819 Other persistent atrial fibrillation: Secondary | ICD-10-CM

## 2016-11-10 DIAGNOSIS — Z8249 Family history of ischemic heart disease and other diseases of the circulatory system: Secondary | ICD-10-CM | POA: Diagnosis not present

## 2016-11-10 DIAGNOSIS — X58XXXA Exposure to other specified factors, initial encounter: Secondary | ICD-10-CM | POA: Diagnosis not present

## 2016-11-10 DIAGNOSIS — Z833 Family history of diabetes mellitus: Secondary | ICD-10-CM | POA: Insufficient documentation

## 2016-11-10 DIAGNOSIS — Z96652 Presence of left artificial knee joint: Secondary | ICD-10-CM | POA: Diagnosis not present

## 2016-11-10 DIAGNOSIS — D72829 Elevated white blood cell count, unspecified: Secondary | ICD-10-CM | POA: Diagnosis not present

## 2016-11-10 DIAGNOSIS — Z7902 Long term (current) use of antithrombotics/antiplatelets: Secondary | ICD-10-CM | POA: Diagnosis not present

## 2016-11-10 DIAGNOSIS — I4891 Unspecified atrial fibrillation: Secondary | ICD-10-CM | POA: Insufficient documentation

## 2016-11-10 DIAGNOSIS — H269 Unspecified cataract: Secondary | ICD-10-CM | POA: Insufficient documentation

## 2016-11-10 DIAGNOSIS — Z9842 Cataract extraction status, left eye: Secondary | ICD-10-CM | POA: Insufficient documentation

## 2016-11-10 DIAGNOSIS — S301XXA Contusion of abdominal wall, initial encounter: Secondary | ICD-10-CM | POA: Insufficient documentation

## 2016-11-10 DIAGNOSIS — Z8261 Family history of arthritis: Secondary | ICD-10-CM | POA: Diagnosis not present

## 2016-11-10 NOTE — Progress Notes (Signed)
Primary Care Physician: Larry Sacramento, MD Referring Physician:Dr. Genella Mech Larry Vasquez is a 66 y.o. male with a h/o recent Afib ablation with complication of rt groin hematoma. He was followed in hospital by Dr. Donnetta Vasquez and has f/u with him 8/23.Pt overall feels that the leg is improving and rt thigh is softer but now has increased swelling of rt lower leg and foot. He denies any Vasquez in the leg, + soreness.No fever or chills. The leg is without new bruising. Bruising is fading along inner thigh and inner knee area. No redness or increased warmth. He has not noted any afib. Heart rate  regular today.He is back on xarelto.He is also c/o constipation probably 2/2 narcotic, oxycodone that he was d/c on and has now finished.  F/u in afib clinic,9/5, he is done well. No awareness of afib. The swelling in his rt leg is much improved and is now mostly around rt ankle and top of rt foot. He notices some soreness both groins if he stands too long, but he has been more active over the last week.   Today, he denies symptoms of palpitations, chest Vasquez, shortness of breath, orthopnea, PND, dizziness, presyncope, syncope, or neurologic sequela. Positive for Rt LLE old bruising. The patient is tolerating medications without difficulties and is otherwise without complaint today.   Past Medical History:  Diagnosis Date  . Cataract    Left eye  . CML (chronic myelocytic leukemia) ([place])   . Leukocytosis, unspecified 10/17/2012  . Thrombocytosis ([place]) 10/17/2012  . White coat hypertension 06/06/2014   Past Surgical History:  Procedure Laterality Date  . ATRIAL FIBRILLATION ABLATION [name] 10/08/2016   Procedure: Atrial Fibrillation Ablation;  Surgeon: Larry Haw, MD;  Location: [place] CV LAB;  Service: Cardiovascular;  Laterality: [name];  . CARDIOVERSION [name] 06/24/2016   Procedure: CARDIOVERSION;  Surgeon: Larry Pain, MD;  Location: [facility] ENDOSCOPY;  Service: Cardiovascular;  Laterality: [name];  .  CATARACT EXTRACTION Left 01/01/2013  . EYE SURGERY  2012   Detached Retina  . HERNIA REPAIR    . JOINT REPLACEMENT  2006   Total Knee Left  . TONSILLECTOMY      Current Outpatient Prescriptions  Medication Sig Dispense Refill  . acetaminophen (TYLENOL) 500 MG tablet Take 1,000 mg by mouth every 6 (six) hours as needed for headache (Vasquez).    . APPLE CIDER VINEGAR PO Take 15 mLs by mouth every other day.    . cholecalciferol (VITAMIN D) 1000 UNITS tablet Take 1,000 Units by mouth daily.     . Coenzyme Q10 (CO Q-10) 100 MG CAPS Take 100 mg by mouth daily.    . dasatinib (SPRYCEL) 50 MG tablet Take 1 tablet (50 mg total) by mouth daily. (Patient taking differently: Take 50 mg by mouth daily before supper. ) 90 tablet 11  . Digestive Enzymes CAPS Take 1 capsule by mouth daily with lunch.     Marland Kitchen ELIQUIS 5 MG TABS tablet TAKE 1 TABLET BY MOUTH TWICE A DAY 60 tablet 5  . furosemide (LASIX) 20 MG tablet Take 1 tablet (20 mg total) by mouth daily. 30 tablet 5  . losartan (COZAAR) 25 MG tablet Take 1 tablet (25 mg total) by mouth daily. 90 tablet 3  . magnesium oxide (MAG-OX) 400 MG tablet Take 400 mg by mouth at bedtime.     . metoprolol (TOPROL-XL) 200 MG 24 hr tablet Take 1 tablet (200 mg total) by mouth daily. Take with or immediately  following a meal. 90 tablet 3  . MILK THISTLE PO Take 1 capsule by mouth 4 (four) times a week.     . Multiple Vitamin (MULTIVITAMIN WITH MINERALS) TABS tablet Take 1 tablet by mouth daily.    Marland Kitchen OVER THE COUNTER MEDICATION Take 1 oz by mouth See admin instructions. ASEA dietary supplement: Drink 1 ounce by mouth up to twice daily.    . TURMERIC CURCUMIN PO Take 2-3 capsules by mouth daily.      No current facility-administered medications for this encounter.     Allergies  Allergen Reactions  . Corticosteroids Other (See Comments)    Muscle stiffness    Social History   Social History  . Marital status: Married    Spouse name: [name]  . Number of  children: [name]  . Years of education: [name]   Occupational History  . Not on file.   Social History Main Topics  . Smoking status: Never Smoker  . Smokeless tobacco: Never Used  . Alcohol use 4.2 oz/week    7 Glasses of wine per week  . Drug use: No  . Sexual activity: Yes    Birth control/ protection: None   Other Topics Concern  . Not on file   Social History Narrative  . No narrative on file    Family History  Problem Relation Age of Onset  . Arthritis Mother   . Hypertension Mother   . Diabetes Father   . Hypertension Father   . Cancer Father        bladder ca  . CAD Father        CABG  . Chronic Renal Failure Father   . Atrial fibrillation Father   . Healthy Brother   . Healthy Brother     ROS- All systems are reviewed and negative except as per the HPI above  Physical Exam: Vitals:   11/10/16 1326  BP: 122/62  Pulse: (!) 50  Weight: 205 lb (93 kg)  Height: 6\' 2"  (1.88 m)   Wt Readings from Last 3 Encounters:  11/10/16 205 lb (93 kg)  10/28/16 211 lb 8 oz (95.9 kg)  10/22/16 214 lb (97.1 kg)    Labs: Lab Results  Component Value Date   NA 134 (L) 10/11/2016   K 4.0 10/11/2016   CL 104 10/11/2016   CO2 23 10/11/2016   GLUCOSE 176 (H) 10/11/2016   BUN 14 10/11/2016   CREATININE 1.06 10/11/2016   CALCIUM 8.5 (L) 10/11/2016   MG 2.1 05/25/2016   Lab Results  Component Value Date   INR 1.45 10/11/2016   No results found for: CHOL, HDL, LDLCALC, TRIG   GEN- The patient is well appearing, alert and oriented x 3 today.   Head- normocephalic, atraumatic Eyes-  Sclera clear, conjunctiva pink Ears- hearing intact Oropharynx- clear Neck- supple, no JVP Lymph- no cervical lymphadenopathy Lungs- Clear to ausculation bilaterally, normal work of breathing Heart- Regular rate and rhythm, no murmurs, rubs or gallops, PMI not laterally displaced GI- soft, NT, ND, + BS Extremities- no clubbing, cyanosis, or edema on Left. Rt LE mild edema right ankle,  top of foot. MS- no significant deformity or atrophy Skin- no rash or lesion Psych- euthymic mood, full affect Neuro- strength and sensation are intact  EKG - Sinus brady at 50 bpm, pr int 222 ms, qrs int 94 ms, qtc 445ms  Assessment and Plan: 1. Afib S/p ablation 8/7 Appears to be staying in [place]  2. Rt  groin hematoma Bruising resolved, swelling minimal rt ankle and foot   F/u with Dr. Shonna Chock as scheduled 11/14 afib clinic as needed  Larry Vasquez, [facility] [address] [place], [place] [address] [phone]

## 2016-11-29 ENCOUNTER — Other Ambulatory Visit (HOSPITAL_BASED_OUTPATIENT_CLINIC_OR_DEPARTMENT_OTHER): Payer: BLUE CROSS/BLUE SHIELD

## 2016-11-29 DIAGNOSIS — C9210 Chronic myeloid leukemia, BCR/ABL-positive, not having achieved remission: Secondary | ICD-10-CM

## 2016-11-29 DIAGNOSIS — C9211 Chronic myeloid leukemia, BCR/ABL-positive, in remission: Secondary | ICD-10-CM

## 2016-11-29 LAB — COMPREHENSIVE METABOLIC PANEL
ALT: 16 U/L (ref 0–55)
ANION GAP: 8 meq/L (ref 3–11)
AST: 17 U/L (ref 5–34)
Albumin: 3.8 g/dL (ref 3.5–5.0)
Alkaline Phosphatase: 63 U/L (ref 40–150)
BUN: 17 mg/dL (ref 7.0–26.0)
CHLORIDE: 107 meq/L (ref 98–109)
CO2: 24 meq/L (ref 22–29)
Calcium: 9.1 mg/dL (ref 8.4–10.4)
Creatinine: 0.9 mg/dL (ref 0.7–1.3)
EGFR: 84 mL/min/{1.73_m2} — AB (ref >90)
Glucose: 101 mg/dl (ref 70–140)
POTASSIUM: 4.3 meq/L (ref 3.5–5.1)
Sodium: 139 mEq/L (ref 136–145)
Total Bilirubin: 0.83 mg/dL (ref 0.20–1.20)
Total Protein: 7 g/dL (ref 6.4–8.3)

## 2016-11-29 LAB — CBC WITH DIFFERENTIAL/PLATELET
BASO%: 0.9 % (ref 0.0–2.0)
BASOS ABS: 0 10*3/uL (ref 0.0–0.1)
EOS ABS: 0.1 10*3/uL (ref 0.0–0.5)
EOS%: 1.9 % (ref 0.0–7.0)
HEMATOCRIT: 37.7 % — AB (ref 38.4–49.9)
HEMOGLOBIN: 12.2 g/dL — AB (ref 13.0–17.1)
LYMPH#: 0.8 10*3/uL — AB (ref 0.9–3.3)
LYMPH%: 25.2 % (ref 14.0–49.0)
MCH: 30.7 pg (ref 27.2–33.4)
MCHC: 32.4 g/dL (ref 32.0–36.0)
MCV: 94.7 fL (ref 79.3–98.0)
MONO#: 0.4 10*3/uL (ref 0.1–0.9)
MONO%: 11 % (ref 0.0–14.0)
NEUT%: 61 % (ref 39.0–75.0)
NEUTROS ABS: 1.9 10*3/uL (ref 1.5–6.5)
Platelets: 173 10*3/uL (ref 140–400)
RBC: 3.98 10*6/uL — ABNORMAL LOW (ref 4.20–5.82)
RDW: 14.4 % (ref 11.0–14.6)
WBC: 3.2 10*3/uL — ABNORMAL LOW (ref 4.0–10.3)

## 2016-12-02 LAB — BCR-ABL1, CML/ALL, PCR, QUANT

## 2016-12-06 ENCOUNTER — Ambulatory Visit (HOSPITAL_BASED_OUTPATIENT_CLINIC_OR_DEPARTMENT_OTHER): Payer: BLUE CROSS/BLUE SHIELD | Admitting: Hematology and Oncology

## 2016-12-06 ENCOUNTER — Telehealth: Payer: Self-pay | Admitting: Hematology and Oncology

## 2016-12-06 DIAGNOSIS — I10 Essential (primary) hypertension: Secondary | ICD-10-CM | POA: Diagnosis not present

## 2016-12-06 DIAGNOSIS — I4891 Unspecified atrial fibrillation: Secondary | ICD-10-CM

## 2016-12-06 DIAGNOSIS — C9210 Chronic myeloid leukemia, BCR/ABL-positive, not having achieved remission: Secondary | ICD-10-CM

## 2016-12-06 DIAGNOSIS — T451X5A Adverse effect of antineoplastic and immunosuppressive drugs, initial encounter: Secondary | ICD-10-CM

## 2016-12-06 DIAGNOSIS — D61810 Antineoplastic chemotherapy induced pancytopenia: Secondary | ICD-10-CM | POA: Diagnosis not present

## 2016-12-06 NOTE — Telephone Encounter (Signed)
Gave avs and calendar for march and April 2019

## 2016-12-07 ENCOUNTER — Encounter: Payer: Self-pay | Admitting: Hematology and Oncology

## 2016-12-07 NOTE — Progress Notes (Signed)
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      11/07/2012 - 08/09/2013 Chemotherapy    He was started on Gleevec 400 mg daily.      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      08/22/2013 -  Chemotherapy    He is started on dasatinib.      11/19/2013 Tumor Marker    BCR/ABL is improving      02/22/2014 Pathology Results    BCR/ABL continues to improve and the patient is moving towards molecular remission      05/27/2014 Pathology Results    BCR/ABL b2a2 & b3a2 both are 0.05%, b2a2 & b3a2 IS both are 0.028%. Patient in MMR      09/26/2014 Pathology Results    Repeat BCR/ABL was not detectable      02/28/2015 Tumor Marker    Repeat BCR/ABL was not detectable      08/28/2015 Tumor Marker    Repeat BCR/ABL was not detectable      01/06/2016 Pathology Results    Repeat BCR/ABL was not detectable      04/15/2016 Pathology Results    Repeat BCR/ABL was not detectable      07/22/2016 Pathology Results    Repeat BCR/ABL was not detectable      11/29/2016 Pathology Results    Repeat BCR/ABL was not detectable       INTERVAL HISTORY: Please see below for problem oriented charting. He returns for further follow-up He tolerated treatment well Denies recent pleural effusion, fluid retention or diarrhea He denies chest pain or shortness of breath The patient denies any recent signs or symptoms  of bleeding such as spontaneous epistaxis, hematuria or hematochezia. No recent infection He has declined influenza vaccination  REVIEW OF SYSTEMS:   Constitutional: Denies fevers, chills or abnormal weight loss Eyes: Denies blurriness of vision Ears, nose, mouth, throat, and face: Denies mucositis or sore throat Respiratory: Denies cough, dyspnea or wheezes Cardiovascular: Denies palpitation, chest discomfort or lower extremity swelling Gastrointestinal:  Denies nausea, heartburn or change in bowel habits Skin: Denies abnormal skin rashes Lymphatics: Denies new lymphadenopathy or easy bruising Neurological:Denies numbness, tingling or new weaknesses Behavioral/Psych: Mood is stable, no new changes  All other systems were reviewed with the patient and are negative.  I have reviewed the past medical history, past surgical history, social history and family history with the patient and they are unchanged from previous note.  ALLERGIES:  is allergic to corticosteroids.  MEDICATIONS:  Current Outpatient Prescriptions  Medication Sig Dispense Refill  . acetaminophen (TYLENOL) 500 MG tablet Take 1,000 mg by mouth every 6 (six) hours as needed for headache (pain).    . APPLE CIDER VINEGAR PO Take 15 mLs by mouth every other day.    . cholecalciferol (VITAMIN D) 1000 UNITS tablet Take 1,000 Units by mouth daily.     . Coenzyme Q10 (  CO Q-10) 100 MG CAPS Take 100 mg by mouth daily.    . dasatinib (SPRYCEL) 50 MG tablet Take 1 tablet (50 mg total) by mouth daily. (Patient taking differently: Take 50 mg by mouth daily before supper. ) 90 tablet 11  . Digestive Enzymes CAPS Take 1 capsule by mouth daily with lunch.     Marland Kitchen ELIQUIS 5 MG TABS tablet TAKE 1 TABLET BY MOUTH TWICE A DAY 60 tablet 5  . furosemide (LASIX) 20 MG tablet Take 1 tablet (20 mg total) by mouth daily. 30 tablet 5  . losartan (COZAAR) 25 MG tablet Take 1 tablet (25 mg total) by mouth daily. 90 tablet 3  . magnesium oxide (MAG-OX)  400 MG tablet Take 400 mg by mouth at bedtime.     . metoprolol (TOPROL-XL) 200 MG 24 hr tablet Take 1 tablet (200 mg total) by mouth daily. Take with or immediately following a meal. 90 tablet 3  . MILK THISTLE PO Take 1 capsule by mouth 4 (four) times a week.     . Multiple Vitamin (MULTIVITAMIN WITH MINERALS) TABS tablet Take 1 tablet by mouth daily.    Marland Kitchen OVER THE COUNTER MEDICATION Take 1 oz by mouth See admin instructions. ASEA dietary supplement: Drink 1 ounce by mouth up to twice daily.    . TURMERIC CURCUMIN PO Take 2-3 capsules by mouth daily.      No current facility-administered medications for this visit.     PHYSICAL EXAMINATION: ECOG PERFORMANCE STATUS: 1 - Symptomatic but completely ambulatory  Vitals:   12/06/16 0847  BP: (!) 162/58  Pulse: (!) 50  Resp: 18  Temp: 97.7 F (36.5 C)  SpO2: 99%   Filed Weights   12/06/16 0847  Weight: 203 lb 12.8 oz (92.4 kg)    GENERAL:alert, no distress and comfortable SKIN: skin color, texture, turgor are normal, no rashes or significant lesions EYES: normal, Conjunctiva are pink and non-injected, sclera clear OROPHARYNX:no exudate, no erythema and lips, buccal mucosa, and tongue normal  NECK: supple, thyroid normal size, non-tender, without nodularity LYMPH:  no palpable lymphadenopathy in the cervical, axillary or inguinal LUNGS: clear to auscultation and percussion with normal breathing effort HEART: regular rate & rhythm and no murmurs and no lower extremity edema ABDOMEN:abdomen soft, non-tender and normal bowel sounds Musculoskeletal:no cyanosis of digits and no clubbing  NEURO: alert & oriented x 3 with fluent speech, no focal motor/sensory deficits  LABORATORY DATA:  I have reviewed the data as listed    Component Value Date/Time   NA 139 11/29/2016 0829   K 4.3 11/29/2016 0829   CL 104 10/11/2016 1328   CO2 24 11/29/2016 0829   GLUCOSE 101 11/29/2016 0829   BUN 17.0 11/29/2016 0829   CREATININE 0.9 11/29/2016  0829   CALCIUM 9.1 11/29/2016 0829   PROT 7.0 11/29/2016 0829   ALBUMIN 3.8 11/29/2016 0829   AST 17 11/29/2016 0829   ALT 16 11/29/2016 0829   ALKPHOS 63 11/29/2016 0829   BILITOT 0.83 11/29/2016 0829   GFRNONAA >60 10/11/2016 1328   GFRAA >60 10/11/2016 1328    No results found for: SPEP, UPEP  Lab Results  Component Value Date   WBC 3.2 (L) 11/29/2016   NEUTROABS 1.9 11/29/2016   HGB 12.2 (L) 11/29/2016   HCT 37.7 (L) 11/29/2016   MCV 94.7 11/29/2016   PLT 173 11/29/2016      Chemistry      Component Value Date/Time   NA 139 11/29/2016 0829  K 4.3 11/29/2016 0829   CL 104 10/11/2016 1328   CO2 24 11/29/2016 0829   BUN 17.0 11/29/2016 0829   CREATININE 0.9 11/29/2016 0829      Component Value Date/Time   CALCIUM 9.1 11/29/2016 0829   ALKPHOS 63 11/29/2016 0829   AST 17 11/29/2016 0829   ALT 16 11/29/2016 0829   BILITOT 0.83 11/29/2016 0829       ASSESSMENT & PLAN:  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HCC) Recent BCR/ABL showed he is in remission. He will continue on reduced dose Sprycel at 50 mg daily by mouth I would not recommend further dose adjustment or stopping his treatment because of documented resistance to [place].  He does not fulfill the criteria for discontinuation of TKI based on current guidelines   Pancytopenia due to antineoplastic chemotherapy [facility]) He is not symptomatic with mild pancytopenia due to his treatment We will continue same dose without dose adjustment  White coat syndrome with hypertension His blood pressure is elevated but the patient is not symptomatic. 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 We will observe only.  He will continue medical management with other antihypertensives as directed  Atrial fibrillation, new onset [facility]) He had recent management by cardiologist for atrial fibrillation I do not believe this is induced by Sprycel He is on chronic  anticoagulation therapy   No orders of the defined types were placed in this encounter.  All questions were answered. The patient knows to call the clinic with any problems, questions or concerns. No barriers to learning was detected. I spent 15 minutes counseling the patient face to face. The total time spent in the appointment was 20 minutes and more than 50% was on counseling and review of test results     Heath Lark, MD 12/07/2016 4:47 PM

## 2016-12-07 NOTE — Assessment & Plan Note (Signed)
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 He will continue on reduced dose Sprycel at 50 mg daily by mouth I would not recommend further dose adjustment or stopping his treatment because of documented resistance to [place].  He does not fulfill the criteria for discontinuation of TKI based on current guidelines

## 2016-12-07 NOTE — Assessment & Plan Note (Signed)
His blood pressure is elevated but the patient is not symptomatic. 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 We will observe only.  He will continue medical management with other antihypertensives as directed

## 2016-12-07 NOTE — Assessment & Plan Note (Signed)
He had recent management by cardiologist for atrial fibrillation I do not believe this is induced by Sprycel He is on chronic anticoagulation therapy

## 2016-12-07 NOTE — Assessment & Plan Note (Signed)
He is not symptomatic with mild pancytopenia due to his treatment We will continue same dose without dose adjustment 

## 2017-01-04 DIAGNOSIS — C9210 Chronic myeloid leukemia, BCR/ABL-positive, not having achieved remission: Secondary | ICD-10-CM | POA: Insufficient documentation

## 2017-01-04 DIAGNOSIS — H269 Unspecified cataract: Secondary | ICD-10-CM | POA: Insufficient documentation

## 2017-01-19 ENCOUNTER — Encounter: Payer: Self-pay | Admitting: Cardiology

## 2017-01-19 ENCOUNTER — Ambulatory Visit (INDEPENDENT_AMBULATORY_CARE_PROVIDER_SITE_OTHER): Payer: BLUE CROSS/BLUE SHIELD | Admitting: Cardiology

## 2017-01-19 VITALS — BP 136/88 | HR 59 | Ht 74.0 in | Wt 210.6 lb

## 2017-01-19 DIAGNOSIS — I428 Other cardiomyopathies: Secondary | ICD-10-CM

## 2017-01-19 DIAGNOSIS — I4819 Other persistent atrial fibrillation: Secondary | ICD-10-CM

## 2017-01-19 DIAGNOSIS — I481 Persistent atrial fibrillation: Secondary | ICD-10-CM | POA: Diagnosis not present

## 2017-01-19 NOTE — Patient Instructions (Signed)
Medication Instructions:    Your physician recommends that you continue on your current medications as directed. Please refer to the Current Medication list given to you today.  --- If you need a refill on your cardiac medications before your next appointment, please call your pharmacy. ---  Labwork:  None ordered  Testing/Procedures: Your physician has requested that you have an echocardiogram. Echocardiography is a painless test that uses sound waves to create images of your heart. It provides your doctor with information about the size and shape of your heart and how well your heart's chambers and valves are working. This procedure takes approximately one hour. There are no restrictions for this procedure.  Follow-Up:  Your physician recommends that you schedule a follow-up appointment in: 3 months with Dr. Camnitz.  Thank you for choosing [facility]!!   Trebor Galdamez, RN [phone]         

## 2017-01-19 NOTE — Progress Notes (Signed)
Electrophysiology Office Note   Date:  01/19/2017   ID:  Ajene Carchi, DOB 02/02/1951, MRN [id number]  PCP:  [name], MD  Cardiologist:  Angelena Form Primary Electrophysiologist:  Quinci Gavidia Meredith Leeds, MD    Chief Complaint  Patient presents with  . Follow-up    Persistent Afib/3 months post ablation     History of Present Illness: Larry Vasquez is a 66 y.o. male who is being seen today for the evaluation of atrial fibrillation at the request of [name], MD. Presenting today for electrophysiology evaluation. He has a history of atrial fibrillation and atrial flutter. He was admitted to the hospital on 06/04/16. 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 He was sent to the emergency room was found to be in acute heart failure. He was also found to be in new onset atrial flutter. 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 He underwent cardioversion on 06/24/16.  He had an atrial fibrillation ablation on 10/08/16.  He returned to the hospital on 10/11/16 with a right groin hematoma.  Today, denies symptoms of palpitations, chest pain, shortness of breath, orthopnea, PND, lower extremity edema, claudication, dizziness, presyncope, syncope, bleeding, or neurologic sequela. The patient is tolerating medications without difficulties.  His lower extremity hematoma has improved and is roughly back to normal.  His only complaint today is of constipation.   Past Medical History:  Diagnosis Date  . Cataract    Left eye  . CML (chronic myelocytic leukemia) ([place])   . Leukocytosis, unspecified 10/17/2012  . Thrombocytosis ([place]) 10/17/2012  . White coat hypertension 06/06/2014   Past Surgical History:  Procedure Laterality Date  . CATARACT EXTRACTION Left 01/01/2013  . EYE SURGERY  2012   Detached Retina  . HERNIA REPAIR    . JOINT REPLACEMENT  2006   Total Knee Left  . TONSILLECTOMY       Current Outpatient Medications   Medication Sig Dispense Refill  . acetaminophen (TYLENOL) 500 MG tablet Take 1,000 mg by mouth every 6 (six) hours as needed for headache (pain).    . APPLE CIDER VINEGAR PO Take 15 mLs by mouth every other day.    . cholecalciferol (VITAMIN D) 1000 UNITS tablet Take 1,000 Units by mouth daily.     . Coenzyme Q10 (CO Q-10) 100 MG CAPS Take 100 mg by mouth daily.    . dasatinib (SPRYCEL) 50 MG tablet Take 1 tablet (50 mg total) by mouth daily. (Patient taking differently: Take 50 mg by mouth daily before supper. ) 90 tablet 11  . Digestive Enzymes CAPS Take 1 capsule by mouth daily with lunch.     Marland Kitchen ELIQUIS 5 MG TABS tablet TAKE 1 TABLET BY MOUTH TWICE A DAY 60 tablet 5  . furosemide (LASIX) 20 MG tablet Take 1 tablet (20 mg total) by mouth daily. (Patient taking differently: Take 20 mg daily as needed by mouth for fluid. ) 30 tablet 5  . losartan (COZAAR) 25 MG tablet Take 1 tablet (25 mg total) by mouth daily. 90 tablet 3  . magnesium oxide (MAG-OX) 400 MG tablet Take 400 mg by mouth at bedtime.     . metoprolol (TOPROL-XL) 200 MG 24 hr tablet Take 1 tablet (200 mg total) by mouth daily. Take with or immediately following a meal. 90 tablet 3  . MILK THISTLE PO Take 1 capsule by mouth 4 (four) times a week.     . Multiple Vitamin (  MULTIVITAMIN WITH MINERALS) TABS tablet Take 1 tablet by mouth daily.    Marland Kitchen OVER THE COUNTER MEDICATION Take 1 oz by mouth See admin instructions. ASEA dietary supplement: Drink 1 ounce by mouth up to twice daily.    . TURMERIC CURCUMIN PO Take 2-3 capsules by mouth daily.      No current facility-administered medications for this visit.     Allergies:   Corticosteroids   Social History:  The patient  reports that  has never smoked. he has never used smokeless tobacco. He reports that he drinks about 4.2 oz of alcohol per week. He reports that he does not use drugs.   Family History:  The patient's family history includes Arthritis in his mother; Atrial  fibrillation in his father; CAD in his father; Cancer in his father; Chronic Renal Failure in his father; Diabetes in his father; Healthy in his brother and brother; Hypertension in his father and mother.    ROS:  Please see the history of present illness.   Otherwise, review of systems is positive for constipation.   All other systems are reviewed and negative.   PHYSICAL EXAM: VS:  BP 136/88   Pulse (!) 59   Ht 6\' 2"  (1.88 m)   Wt 210 lb 9.6 oz (95.5 kg)   BMI 27.04 kg/m  , BMI Body mass index is 27.04 kg/m. GEN: Well nourished, well developed, in no acute distress  HEENT: normal  Neck: no JVD, carotid bruits, or masses Cardiac: RRR; no murmurs, rubs, or gallops,no edema  Respiratory:  clear to auscultation bilaterally, normal work of breathing GI: soft, nontender, nondistended, + BS MS: no deformity or atrophy  Skin: warm and dry Neuro:  Strength and sensation are intact Psych: euthymic mood, full affect  EKG:  EKG is ordered today. Personal review of the ekg ordered shows sinus rhythm, 1 degree AV block, LVH by voltage   Recent Labs: 05/25/2016: B Natriuretic Peptide 348.0; Magnesium 2.1; TSH 2.131 11/29/2016: ALT 16; BUN 17.0; Creatinine 0.9; HGB 12.2; Platelets 173; Potassium 4.3; Sodium 139    Lipid Panel  No results found for: CHOL, TRIG, HDL, CHOLHDL, VLDL, LDLCALC, LDLDIRECT   Wt Readings from Last 3 Encounters:  01/19/17 210 lb 9.6 oz (95.5 kg)  12/06/16 203 lb 12.8 oz (92.4 kg)  11/10/16 205 lb (93 kg)      Other studies Reviewed: Additional studies/ records that were reviewed today include: TTE 05/26/16  Review of the above records today demonstrates:  - Left ventricle: The cavity size was normal. Wall thickness was   increased in a pattern of mild LVH. Systolic function was   moderately reduced. The estimated ejection fraction was in the   range of 35% to 40%. Diffuse hypokinesis. Indeterminant diastolic   function (atrial fibrillation). - Aortic valve:  There was no stenosis. There was trivial   regurgitation. - Aorta: Mildly dilated aortic root. Aortic root dimension: 41 mm   (ED). - Mitral valve: There was mild regurgitation. - Left atrium: The atrium was severely dilated. - Right ventricle: The cavity size was normal. Systolic function   was moderately reduced. - Right atrium: The atrium was moderately to severely dilated. - Tricuspid valve: Peak RV-RA gradient (S): 23 mm Hg. - Pulmonary arteries: PA peak pressure: 38 mm Hg (S). - Systemic veins: IVC measured 2.4 cm with < 50% respirophasic   variation, suggesting RA pressure 15 mmHg. - Pericardium, extracardiac: A trivial pericardial effusion was   identified posterior to the heart.  ASSESSMENT AND PLAN:  1.  Persistent atrial fibrillation/flutter: Currently on Eliquis.  Status post atrial fibrillation ablation on 04/11/38 complicated by groin hematoma.  Hematoma has been followed by vascular surgery who felt to be stable.  Has since resolved.  This patients CHA2DS2-VASc Score and unadjusted Ischemic Stroke Rate (% per year) is equal to 2.2 % stroke rate/year from a score of 2  Above score calculated as 1 point each if present [CHF, HTN, DM, Vascular=MI/PAD/Aortic Plaque, Age if 65-74, or Male] Above score calculated as 2 points each if present [Age > 75, or Stroke/TIA/TE]  2. Systolic heart failure: EF 35-40%. On ARB and BB.  He is not currently having any shortness of breath, fatigue, or weakness.  That being said, he would like to recheck his cardiac function.  We Marquis Down order a transthoracic echo today.  3.  Constipation: Advised over-the-counter fiber and MiraLAX as needed.   Current medicines are reviewed at length with the patient today.   The patient does not have concerns regarding his medicines.  The following changes were made today:    Labs/ tests ordered today include:  Orders Placed This Encounter  Procedures  . EKG 12-Lead  . ECHOCARDIOGRAM COMPLETE      Disposition:   FU with Ofilia Rayon 3 months  Signed, Amrit Erck Meredith Leeds, MD  01/19/2017 9:53 AM     [facility] [address] [place] [address] [phone] (office) [phone] (fax)

## 2017-01-24 ENCOUNTER — Other Ambulatory Visit: Payer: Self-pay | Admitting: Hematology and Oncology

## 2017-01-25 DIAGNOSIS — Z8679 Personal history of other diseases of the circulatory system: Secondary | ICD-10-CM | POA: Insufficient documentation

## 2017-01-26 ENCOUNTER — Other Ambulatory Visit (HOSPITAL_COMMUNITY): Payer: BLUE CROSS/BLUE SHIELD

## 2017-01-31 ENCOUNTER — Telehealth: Payer: Self-pay

## 2017-01-31 NOTE — Telephone Encounter (Signed)
Pt called that he had his annual physical and he wanted to report his WBC 2.5, "this is the lowest it has ever been"   His PCP is Dr Kathryne Eriksson [facility] [phone]. Called [facility] and had labs faxed to Dr Calton Dach pod.

## 2017-01-31 NOTE — Telephone Encounter (Signed)
ANC 1.2. Recommend recheck CBC with diff this week

## 2017-01-31 NOTE — Telephone Encounter (Signed)
Called patient per Dr. Alvy Bimler, he would like to come in for labs on Wednesday at 0830. Scheduling message sent. Told him we would call him with instructions after repeat lab work.

## 2017-02-02 ENCOUNTER — Telehealth: Payer: Self-pay | Admitting: *Deleted

## 2017-02-02 ENCOUNTER — Other Ambulatory Visit (HOSPITAL_BASED_OUTPATIENT_CLINIC_OR_DEPARTMENT_OTHER): Payer: BLUE CROSS/BLUE SHIELD

## 2017-02-02 DIAGNOSIS — C9210 Chronic myeloid leukemia, BCR/ABL-positive, not having achieved remission: Secondary | ICD-10-CM

## 2017-02-02 LAB — CBC WITH DIFFERENTIAL/PLATELET
BASO%: 0.8 % (ref 0.0–2.0)
Basophils Absolute: 0 10*3/uL (ref 0.0–0.1)
EOS%: 2.4 % (ref 0.0–7.0)
Eosinophils Absolute: 0.1 10*3/uL (ref 0.0–0.5)
HCT: 39.2 % (ref 38.4–49.9)
HGB: 13.1 g/dL (ref 13.0–17.1)
LYMPH%: 27.5 % (ref 14.0–49.0)
MCH: 30.5 pg (ref 27.2–33.4)
MCHC: 33.4 g/dL (ref 32.0–36.0)
MCV: 91.2 fL (ref 79.3–98.0)
MONO#: 0.4 10*3/uL (ref 0.1–0.9)
MONO%: 13.4 % (ref 0.0–14.0)
NEUT%: 55.9 % (ref 39.0–75.0)
NEUTROS ABS: 1.8 10*3/uL (ref 1.5–6.5)
Platelets: 145 10*3/uL (ref 140–400)
RBC: 4.3 10*6/uL (ref 4.20–5.82)
RDW: 14.3 % (ref 11.0–14.6)
WBC: 3.1 10*3/uL — ABNORMAL LOW (ref 4.0–10.3)
lymph#: 0.9 10*3/uL (ref 0.9–3.3)

## 2017-02-02 NOTE — Telephone Encounter (Signed)
-----   Message from Heath Lark, MD sent at 02/02/2017  9:09 AM EST ----- Regarding: CBC and ANC OK Tell him to resume Sprycel without dose change CBC and ANC OK ----- Message ----- From: Interface, Lab In Three Zero One Sent: 02/02/2017   9:05 AM To: Heath Lark, MD

## 2017-02-02 NOTE — Telephone Encounter (Signed)
Left message with note below 

## 2017-02-07 ENCOUNTER — Other Ambulatory Visit: Payer: Self-pay

## 2017-02-07 ENCOUNTER — Ambulatory Visit (HOSPITAL_COMMUNITY): Payer: BLUE CROSS/BLUE SHIELD | Attending: Internal Medicine

## 2017-02-07 DIAGNOSIS — I481 Persistent atrial fibrillation: Secondary | ICD-10-CM | POA: Insufficient documentation

## 2017-02-07 DIAGNOSIS — I4819 Other persistent atrial fibrillation: Secondary | ICD-10-CM

## 2017-02-07 DIAGNOSIS — I081 Rheumatic disorders of both mitral and tricuspid valves: Secondary | ICD-10-CM | POA: Diagnosis not present

## 2017-02-07 DIAGNOSIS — I429 Cardiomyopathy, unspecified: Secondary | ICD-10-CM | POA: Insufficient documentation

## 2017-02-07 DIAGNOSIS — I428 Other cardiomyopathies: Secondary | ICD-10-CM | POA: Diagnosis not present

## 2017-02-07 LAB — ECHOCARDIOGRAM COMPLETE
AVLVOTPG: 6 mmHg
Area-P 1/2: 1.83 cm2
CHL CUP DOP CALC LVOT VTI: 26.2 cm
CHL CUP MV DEC (S): 408
CHL CUP RV SYS PRESS: 34 mmHg
CHL CUP TV REG PEAK VELOCITY: 277 cm/s
E/e' ratio: 4.4
EWDT: 408 ms
FS: 29 % (ref 28–44)
IV/PV OW: 0.75
LA ID, A-P, ES: 47 mm
LA diam end sys: 47 mm
LA diam index: 2.09 cm/m2
LA vol A4C: 90.3 ml
LA vol index: 46.3 mL/m2
LA vol: 104 mL
LDCA: 4.52 cm2
LV E/e' medial: 4.4
LV PW d: 16 mm — AB (ref 0.6–1.1)
LV TDI E'MEDIAL: 7.62
LV e' LATERAL: 12.8 cm/s
LVEEAVG: 4.4
LVOT SV: 118 mL
LVOT peak vel: 122 cm/s
LVOTD: 24 mm
Lateral S' vel: 11.3 cm/s
MV pk E vel: 56.3 m/s
MVPKAVEL: 51.4 m/s
MVSPHT: 120 ms
P 1/2 time: 578 ms
RV TAPSE: 20.9 mm
TDI e' lateral: 12.8
TR max vel: 277 cm/s
TVPG: 277 mmHg

## 2017-03-28 ENCOUNTER — Telehealth: Payer: Self-pay | Admitting: Cardiology

## 2017-03-28 NOTE — Telephone Encounter (Signed)
New Message   Patient indicates that he is having a burning sensation in his groin at the site of where he had the ablation done. The pain is mostly on the left side. When he stands for awhile and he gets a sharp pain.

## 2017-03-28 NOTE — Telephone Encounter (Signed)
Called patient back. Patient complaining about left groin pain during activity that is at the same site where they put a cath. in during his Ablation in August. Patient stated he did have a hematoma on the right side that Dr. [name] was following and it has resolved. Patient stated he called Dr. Luther Parody office and they referred him to our office. Patient stated he is not having any other symptoms. Patient stated he has pain with activity and when standing long periods of time. Informed patient that Dr. Curt Bears is not in the office today, but a message would be sent to him and his nurse to address.  Patient verbalized understanding and stated does not have any pain at this time.

## 2017-03-29 NOTE — Telephone Encounter (Signed)
Larry Vasquez is calling because he stated he thinks the issue is a hernia related and does not need to see Dr. Curt Bears for this and willjust keep his appt in Feb for the follow up , unless Dr. Curt Bears thinks he needs to see him . Please call if you have any questions.   Thanks

## 2017-03-29 NOTE — Telephone Encounter (Signed)
If he feels that this is due to bleeding, would need an ultrasound, otherwise, should follow up at next scheduled visit.

## 2017-04-24 NOTE — Progress Notes (Signed)
Electrophysiology Office Note   Date:  04/25/2017   ID:  Larry Vasquez, DOB April 03, 1950, MRN [id number]  PCP:  [name], MD  Cardiologist:  Angelena Form Primary Electrophysiologist:  Akeila Lana Meredith Leeds, MD    Chief Complaint  Patient presents with  . Follow-up    Persistent Afib     History of Present Illness: Larry Vasquez is a 67 y.o. male who is being seen today for the evaluation of atrial fibrillation at the request of [name], MD. Presenting today for electrophysiology evaluation. He has a history of atrial fibrillation and atrial flutter. He was admitted to the hospital on 06/04/16. 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 He was sent to the emergency room was found to be in acute heart failure. He was also found to be in new onset atrial flutter. 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 He underwent cardioversion on 06/24/16.  He had an atrial fibrillation ablation on 10/08/16.  He returned to the hospital on 10/11/16 with a right groin hematoma.  Time, he is felt well.  He has noted no further atrial fibrillation.  He recently saw general surgery for a left inguinal hernia.  He is planning to have surgical repair upcoming.  Today, denies symptoms of palpitations, chest pain, shortness of breath, orthopnea, PND, lower extremity edema, claudication, dizziness, presyncope, syncope, bleeding, or neurologic sequela. The patient is tolerating medications without difficulties.     Past Medical History:  Diagnosis Date  . Cataract    Left eye  . CML (chronic myelocytic leukemia) ([place])   . Leukocytosis, unspecified 10/17/2012  . Thrombocytosis ([place]) 10/17/2012  . White coat hypertension 06/06/2014   Past Surgical History:  Procedure Laterality Date  . ATRIAL FIBRILLATION ABLATION [name] 10/08/2016   Procedure: Atrial Fibrillation Ablation;  Surgeon: Constance Haw, MD;  Location: [facility];   Service: Cardiovascular;  Laterality: [name];  . CARDIOVERSION [name] 06/24/2016   Procedure: CARDIOVERSION;  Surgeon: [name], MD;  Location: [facility] ENDOSCOPY;  Service: Cardiovascular;  Laterality: [name];  . CATARACT EXTRACTION Left 01/01/2013  . EYE SURGERY  2012   Detached Retina  . HERNIA REPAIR    . JOINT REPLACEMENT  2006   Total Knee Left  . TONSILLECTOMY       Current Outpatient Medications  Medication Sig Dispense Refill  . acetaminophen (TYLENOL) 500 MG tablet Take 1,000 mg by mouth every 6 (six) hours as needed for headache (pain).    . APPLE CIDER VINEGAR PO Take 15 mLs by mouth every other day.    . Cholecalciferol (VITAMIN D PO) Take 5,000 Units by mouth daily.    . Coenzyme Q10 (CO Q-10) 100 MG CAPS Take 100 mg by mouth daily.    . Digestive Enzymes CAPS Take 1 capsule by mouth daily with lunch.     Marland Kitchen ELIQUIS 5 MG TABS tablet TAKE 1 TABLET BY MOUTH TWICE A DAY 60 tablet 5  . losartan (COZAAR) 25 MG tablet Take 1 tablet (25 mg total) by mouth daily. 90 tablet 3  . magnesium oxide (MAG-OX) 400 MG tablet Take 400 mg by mouth at bedtime.     . metoprolol (TOPROL-XL) 200 MG 24 hr tablet Take 1 tablet (200 mg total) by mouth daily. Take with or immediately following a meal. 90 tablet 3  . MILK THISTLE PO Take 1 capsule by mouth 4 (four) times a week.     . Multiple  Vitamin (MULTIVITAMIN WITH MINERALS) TABS tablet Take 1 tablet by mouth daily.    Marland Kitchen OVER THE COUNTER MEDICATION Take 1 oz by mouth See admin instructions. ASEA dietary supplement: Drink 1 ounce by mouth up to twice daily.    . SPRYCEL 50 MG tablet TAKE 1 TABLET (50 MG) BY MOUTH ONCE DAILY AT THE SAME TIME. MAY TAKE WITH OR WITHOUT FOOD. SWALLOW WHOLE. AVOID GRAPEFRUIT PRODUCTS. 90 tablet 2  . TURMERIC CURCUMIN PO Take 2-3 capsules by mouth daily.      No current facility-administered medications for this visit.     Allergies:   Corticosteroids   Social History:  The patient  reports that  has never smoked. he has  never used smokeless tobacco. He reports that he drinks about 4.2 oz of alcohol per week. He reports that he does not use drugs.   Family History:  The patient's family history includes Arthritis in his mother; Atrial fibrillation in his father; CAD in his father; Cancer in his father; Chronic Renal Failure in his father; Diabetes in his father; Healthy in his brother and brother; Hypertension in his father and mother.   ROS:  Please see the history of present illness.   Otherwise, review of systems is positive for snoring, constipation.   All other systems are reviewed and negative.   PHYSICAL EXAM: VS:  BP (!) 144/86   Pulse (!) 57   Ht 6\' 2"  (1.88 m)   Wt 216 lb (98 kg)   BMI 27.73 kg/m  , BMI Body mass index is 27.73 kg/m. GEN: Well nourished, well developed, in no acute distress  HEENT: normal  Neck: no JVD, carotid bruits, or masses Cardiac: RRR; no murmurs, rubs, or gallops,no edema  Respiratory:  clear to auscultation bilaterally, normal work of breathing GI: soft, nontender, nondistended, + BS MS: no deformity or atrophy  Skin: warm and dry Neuro:  Strength and sensation are intact Psych: euthymic mood, full affect  EKG:  EKG is ordered today. Personal review of the ekg ordered shows SR, 1dAVB, rate 57   Recent Labs: 05/25/2016: B Natriuretic Peptide 348.0; Magnesium 2.1; TSH 2.131 11/29/2016: ALT 16; BUN 17.0; Creatinine 0.9; Potassium 4.3; Sodium 139 02/02/2017: HGB 13.1; Platelets 145    Lipid Panel  No results found for: CHOL, TRIG, HDL, CHOLHDL, VLDL, LDLCALC, LDLDIRECT   Wt Readings from Last 3 Encounters:  04/25/17 216 lb (98 kg)  01/19/17 210 lb 9.6 oz (95.5 kg)  12/06/16 203 lb 12.8 oz (92.4 kg)      Other studies Reviewed: Additional studies/ records that were reviewed today include: TTE 02/07/17  Review of the above records today demonstrates:  - Left ventricle: The cavity size was moderately dilated. Wall   thickness was increased in a pattern of  moderate LVH. Systolic   function was normal. The estimated ejection fraction was in the   range of 50% to 55%. - Aortic valve: There was trivial regurgitation. - Mitral valve: There was mild regurgitation. - Left atrium: The atrium was mildly to moderately dilated. - Right ventricle: Systolic function was mildly reduced. - Right atrium: The atrium was mildly dilated. - Tricuspid valve: There was mild-moderate regurgitation. - Pulmonary arteries: PA peak pressure: 34 mm Hg (S).   ASSESSMENT AND PLAN:  1.  Persistent atrial fibrillation/flutter: Early on Eliquis.  Status post atrial fibrillation ablation 10/13/89 complicated by groin hematoma that has since resolved.  He is in sinus rhythm today.  No changes.    This patients  CHA2DS2-VASc Score and unadjusted Ischemic Stroke Rate (% per year) is equal to 2.2 % stroke rate/year from a score of 2  Above score calculated as 1 point each if present [CHF, HTN, DM, Vascular=MI/PAD/Aortic Plaque, Age if 65-74, or Male] Above score calculated as 2 points each if present [Age > 75, or Stroke/TIA/TE]  2. Systolic heart failure: Initially ejection fraction 35-40%.  On ARB and beta-blocker.  Repeat echo shows an EF of 50-55%.  No medication changes at this time.  3.  Hypertension: Currently well controlled.  No changes.  4.  Preoperative evaluation: Plan for left inguinal surgery.  He has been in sinus rhythm since ablation.  Would be at low-intermediate risk for an intermediate risk procedure.  Would be okay to hold Eliquis prior to the procedure with restarting it immediately thereafter.  Would continue losartan and Toprol-XL throughout the procedure.   Current medicines are reviewed at length with the patient today.   The patient does not have concerns regarding his medicines.  The following changes were made today:  none  Labs/ tests ordered today include:  Orders Placed This Encounter  Procedures  . EKG 12-Lead     Disposition:   FU  with Larry Vasquez 6 months  Signed, Larry Vasquez Meredith Leeds, MD  04/25/2017 8:48 AM     [facility] [address] [place] [address] [phone] (office) [phone] (fax)

## 2017-04-25 ENCOUNTER — Telehealth: Payer: Self-pay

## 2017-04-25 ENCOUNTER — Encounter: Payer: Self-pay | Admitting: Cardiology

## 2017-04-25 ENCOUNTER — Ambulatory Visit: Payer: BLUE CROSS/BLUE SHIELD | Admitting: Cardiology

## 2017-04-25 VITALS — BP 144/86 | HR 57 | Ht 74.0 in | Wt 216.0 lb

## 2017-04-25 DIAGNOSIS — I4819 Other persistent atrial fibrillation: Secondary | ICD-10-CM

## 2017-04-25 DIAGNOSIS — I428 Other cardiomyopathies: Secondary | ICD-10-CM | POA: Diagnosis not present

## 2017-04-25 DIAGNOSIS — I481 Persistent atrial fibrillation: Secondary | ICD-10-CM | POA: Diagnosis not present

## 2017-04-25 DIAGNOSIS — Z01810 Encounter for preprocedural cardiovascular examination: Secondary | ICD-10-CM

## 2017-04-25 DIAGNOSIS — I10 Essential (primary) hypertension: Secondary | ICD-10-CM

## 2017-04-25 NOTE — Telephone Encounter (Signed)
   Primary Cardiologist: Will Meredith Leeds, MD  Chart reviewed as part of pre-operative protocol coverage. Patient was contacted 04/25/2017 in reference to pre-operative risk assessment for pending surgery as outlined below.  Hence Larry Vasquez was last seen on 04/25/17 by Dr. Curt Bears.   Per his clinic note: "Preoperative evaluation: Plan for left inguinal surgery.  He has been in sinus rhythm since ablation.  Would be at low-intermediate risk for an intermediate risk procedure.  Would be okay to hold Eliquis prior to the procedure with restarting it immediately thereafter.  Would continue losartan and Toprol-XL throughout the procedure."  Therefore, based on ACC/AHA guidelines, the patient would be at acceptable risk for the planned procedure without further cardiovascular testing.   I will route this recommendation to the requesting party via Epic fax function and remove from pre-op pool.  Please call with questions.  Tami Lin Givanni Staron, PA 04/25/2017, 3:28 PM

## 2017-04-25 NOTE — Telephone Encounter (Signed)
   [facility] Pre-operative Risk Assessment    Request for surgical clearance:  1. What type of surgery is being performed?    - LEFT inguinal hernia repair surgery  2. When is this surgery scheduled?    - Pending Clearance   3. Are there any medications that need to be held prior to surgery and how long?   -Eliquis   - 5 days prior to surgery  4. Practice name and name of physician performing surgery?    - [facility]   - Alphonsa Overall, MD  5. What is your office phone and fax number?    - Phone: [phone]   - Fax: [phone] (ATTN: April Staton, [place])  6. Anesthesia type (None, local, MAC, general) ?    Vivi Barrack J 04/25/2017, 10:30 AM  _________________________________________________________________   (provider comments below)

## 2017-04-25 NOTE — Patient Instructions (Signed)
Medication Instructions:  Your physician recommends that you continue on your current medications as directed. Please refer to the Current Medication list given to you today.  If you need a refill on your cardiac medications before your next appointment, please call your pharmacy.   Labwork: None ordered  Testing/Procedures: None ordered  Follow-Up: Your physician wants you to follow-up in: 6 months with Dr. Camnitz.  You will receive a reminder letter in the mail two months in advance. If you don't receive a letter, please call our office to schedule the follow-up appointment.  Thank you for choosing [facility]!!   Onelia Cadmus, RN [phone]         

## 2017-05-06 ENCOUNTER — Telehealth: Payer: Self-pay | Admitting: *Deleted

## 2017-05-06 NOTE — Telephone Encounter (Signed)
Faxed ROI to [facility] 05/06/17 @ 11:25am, Attn Donald Prose RN, Reference # [id number].

## 2017-05-27 ENCOUNTER — Other Ambulatory Visit: Payer: Self-pay

## 2017-05-27 DIAGNOSIS — C9210 Chronic myeloid leukemia, BCR/ABL-positive, not having achieved remission: Secondary | ICD-10-CM

## 2017-05-30 ENCOUNTER — Inpatient Hospital Stay: Payer: BLUE CROSS/BLUE SHIELD | Attending: Hematology and Oncology

## 2017-05-30 ENCOUNTER — Other Ambulatory Visit: Payer: Self-pay | Admitting: Hematology and Oncology

## 2017-05-30 DIAGNOSIS — C9210 Chronic myeloid leukemia, BCR/ABL-positive, not having achieved remission: Secondary | ICD-10-CM

## 2017-05-30 LAB — CMP (CANCER CENTER ONLY)
ALK PHOS: 60 U/L (ref 40–150)
ALT: 20 U/L (ref 0–55)
ANION GAP: 8 (ref 3–11)
AST: 20 U/L (ref 5–34)
Albumin: 3.6 g/dL (ref 3.5–5.0)
BUN: 20 mg/dL (ref 7–26)
CALCIUM: 9.2 mg/dL (ref 8.4–10.4)
CO2: 25 mmol/L (ref 22–29)
CREATININE: 1.09 mg/dL (ref 0.70–1.30)
Chloride: 106 mmol/L (ref 98–109)
Glucose, Bld: 110 mg/dL (ref 70–140)
Potassium: 4.4 mmol/L (ref 3.5–5.1)
SODIUM: 139 mmol/L (ref 136–145)
Total Bilirubin: 0.6 mg/dL (ref 0.2–1.2)
Total Protein: 6.7 g/dL (ref 6.4–8.3)

## 2017-05-30 LAB — CBC WITH DIFFERENTIAL (CANCER CENTER ONLY)
BASOS ABS: 0 10*3/uL (ref 0.0–0.1)
BASOS PCT: 1 %
EOS ABS: 0.2 10*3/uL (ref 0.0–0.5)
Eosinophils Relative: 7 %
HEMATOCRIT: 38.6 % (ref 38.4–49.9)
HEMOGLOBIN: 12.8 g/dL — AB (ref 13.0–17.1)
Lymphocytes Relative: 35 %
Lymphs Abs: 1.1 10*3/uL (ref 0.9–3.3)
MCH: 30.9 pg (ref 27.2–33.4)
MCHC: 33.3 g/dL (ref 32.0–36.0)
MCV: 92.9 fL (ref 79.3–98.0)
Monocytes Absolute: 0.4 10*3/uL (ref 0.1–0.9)
Monocytes Relative: 14 %
NEUTROS ABS: 1.4 10*3/uL — AB (ref 1.5–6.5)
NEUTROS PCT: 43 %
Platelet Count: 169 10*3/uL (ref 140–400)
RBC: 4.16 MIL/uL — AB (ref 4.20–5.82)
RDW: 13.5 % (ref 11.0–14.6)
WBC: 3.2 10*3/uL — AB (ref 4.0–10.3)

## 2017-06-06 ENCOUNTER — Encounter: Payer: Self-pay | Admitting: Hematology and Oncology

## 2017-06-06 ENCOUNTER — Inpatient Hospital Stay: Payer: BLUE CROSS/BLUE SHIELD | Attending: Hematology and Oncology | Admitting: Hematology and Oncology

## 2017-06-06 ENCOUNTER — Telehealth: Payer: Self-pay | Admitting: Hematology and Oncology

## 2017-06-06 VITALS — BP 171/72 | HR 63 | Temp 97.7°F | Resp 18 | Ht 74.0 in | Wt 219.1 lb

## 2017-06-06 DIAGNOSIS — I4891 Unspecified atrial fibrillation: Secondary | ICD-10-CM

## 2017-06-06 DIAGNOSIS — C9210 Chronic myeloid leukemia, BCR/ABL-positive, not having achieved remission: Secondary | ICD-10-CM | POA: Diagnosis not present

## 2017-06-06 DIAGNOSIS — I10 Essential (primary) hypertension: Secondary | ICD-10-CM | POA: Diagnosis not present

## 2017-06-06 DIAGNOSIS — T451X5A Adverse effect of antineoplastic and immunosuppressive drugs, initial encounter: Secondary | ICD-10-CM

## 2017-06-06 DIAGNOSIS — D61810 Antineoplastic chemotherapy induced pancytopenia: Secondary | ICD-10-CM | POA: Diagnosis not present

## 2017-06-06 DIAGNOSIS — C9211 Chronic myeloid leukemia, BCR/ABL-positive, in remission: Secondary | ICD-10-CM | POA: Insufficient documentation

## 2017-06-06 DIAGNOSIS — I482 Chronic atrial fibrillation, unspecified: Secondary | ICD-10-CM

## 2017-06-06 NOTE — Assessment & Plan Note (Signed)
His blood pressure is elevated but the patient is not symptomatic. 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 We will observe only.  He will continue medical management with other antihypertensives as directed

## 2017-06-06 NOTE — Progress Notes (Signed)
[facility] OFFICE PROGRESS NOTE  Patient Care Team: [name], MD as PCP - General (Family Medicine) Constance Haw, MD as PCP - Cardiology (Cardiology)  ASSESSMENT & PLAN:  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[place]) Recent BCR/ABL showed he is in remission. He will continue on reduced dose Sprycel at 50 mg daily by mouth I would not recommend further dose adjustment or stopping his treatment because of documented resistance to [place].  He does not fulfill the criteria for discontinuation of TKI based on current guidelines I will see him back in 6 months He is educated to watch out for signs and symptoms of side effects of treatment such as fluid retention, shortness of breath or infection  Pancytopenia due to antineoplastic chemotherapy [facility]) He is not symptomatic with mild pancytopenia due to his treatment We will continue same dose without dose adjustment  White coat syndrome with hypertension His blood pressure is elevated but the patient is not symptomatic. 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 We will observe only.  He will continue medical management with other antihypertensives as directed  AF (atrial fibrillation) ([place]) The patient is on medical management There is no contraindication for him to be on anticoagulation therapy indefinitely   Orders Placed This Encounter  Procedures  . Comprehensive metabolic panel    Standing Status:   Future    Standing Expiration Date:   07/11/2018  . CBC with Differential/Platelet    Standing Status:   Future    Standing Expiration Date:   07/11/2018  . BCR-ABL    With RT-PCR technique    Standing Status:   Future    Standing Expiration Date:   07/11/2018    INTERVAL HISTORY: Please see below for problem oriented charting. He returns for further follow-up He denies recent infection He is compliant taking his medications as directed He has gained some  weight He denies recent shortness of breath, significant leg swelling or cough The patient denies any recent signs or symptoms of bleeding such as spontaneous epistaxis, hematuria or hematochezia.   SUMMARY OF ONCOLOGIC HISTORY: Oncology History   Physical     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[place])   11/01/2012 Bone Marrow Biopsy    Bone marrow aspirate and biopsy was performed for leukocytosis and thrombocytosis. 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      11/07/2012 - 08/09/2013 Chemotherapy    He was started on Gleevec 400 mg daily.      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      08/22/2013 -  Chemotherapy    He is started on dasatinib.      11/19/2013 Tumor Marker    BCR/ABL is improving      02/22/2014 Pathology Results    BCR/ABL continues to improve and the patient is moving towards molecular remission      05/27/2014 Pathology Results    BCR/ABL b2a2 & b3a2 both are 0.05%, b2a2 & b3a2 IS both are 0.028%. Patient in MMR      09/26/2014 Pathology Results    Repeat BCR/ABL was not detectable      02/28/2015 Tumor Marker    Repeat BCR/ABL was not detectable  08/28/2015 Tumor Marker    Repeat BCR/ABL was not detectable      01/06/2016 Pathology Results    Repeat BCR/ABL was not detectable      04/15/2016 Pathology Results    Repeat BCR/ABL was not detectable      07/22/2016 Pathology Results    Repeat BCR/ABL was not detectable      11/29/2016 Pathology Results    Repeat BCR/ABL was not detectable      05/30/2017 Pathology Results    Repeat BCR/ABL was not detectable       REVIEW OF SYSTEMS:   Constitutional: Denies fevers, chills or abnormal weight loss Eyes: Denies blurriness of vision Ears, nose,  mouth, throat, and face: Denies mucositis or sore throat Respiratory: Denies cough, dyspnea or wheezes Cardiovascular: Denies palpitation, chest discomfort or lower extremity swelling Gastrointestinal:  Denies nausea, heartburn or change in bowel habits Skin: Denies abnormal skin rashes Lymphatics: Denies new lymphadenopathy or easy bruising Neurological:Denies numbness, tingling or new weaknesses Behavioral/Psych: Mood is stable, no new changes  All other systems were reviewed with the patient and are negative.  I have reviewed the past medical history, past surgical history, social history and family history with the patient and they are unchanged from previous note.  ALLERGIES:  is allergic to corticosteroids.  MEDICATIONS:  Current Outpatient Medications  Medication Sig Dispense Refill  . acetaminophen (TYLENOL) 500 MG tablet Take 1,000 mg by mouth every 6 (six) hours as needed for headache (pain).    . APPLE CIDER VINEGAR PO Take 15 mLs by mouth every other day.    . Cholecalciferol (VITAMIN D PO) Take 5,000 Units by mouth daily.    . Coenzyme Q10 (CO Q-10) 100 MG CAPS Take 100 mg by mouth daily.    . Digestive Enzymes CAPS Take 1 capsule by mouth daily with lunch.     Marland Kitchen ELIQUIS 5 MG TABS tablet TAKE 1 TABLET BY MOUTH TWICE A DAY 60 tablet 5  . losartan (COZAAR) 25 MG tablet Take 1 tablet (25 mg total) by mouth daily. 90 tablet 3  . magnesium oxide (MAG-OX) 400 MG tablet Take 400 mg by mouth at bedtime.     . metoprolol (TOPROL-XL) 200 MG 24 hr tablet Take 1 tablet (200 mg total) by mouth daily. Take with or immediately following a meal. 90 tablet 3  . MILK THISTLE PO Take 1 capsule by mouth 4 (four) times a week.     . Multiple Vitamin (MULTIVITAMIN WITH MINERALS) TABS tablet Take 1 tablet by mouth daily.    Marland Kitchen OVER THE COUNTER MEDICATION Take 1 oz by mouth See admin instructions. ASEA dietary supplement: Drink 1 ounce by mouth up to twice daily.    . SPRYCEL 50 MG tablet TAKE 1  TABLET (50 MG) BY MOUTH ONCE DAILY AT THE SAME TIME. MAY TAKE WITH OR WITHOUT FOOD. SWALLOW WHOLE. AVOID GRAPEFRUIT PRODUCTS. 90 tablet 2  . TURMERIC CURCUMIN PO Take 2-3 capsules by mouth daily.      No current facility-administered medications for this visit.     PHYSICAL EXAMINATION: ECOG PERFORMANCE STATUS: 1 - Symptomatic but completely ambulatory  Vitals:   06/06/17 0851  BP: (!) 171/72  Pulse: 63  Resp: 18  Temp: 97.7 F (36.5 C)  SpO2: 99%   Filed Weights   06/06/17 0851  Weight: 219 lb 1.6 oz (99.4 kg)    GENERAL:alert, no distress and comfortable SKIN: skin color, texture, turgor are normal, no rashes or significant lesions EYES: normal,  Conjunctiva are pink and non-injected, sclera clear OROPHARYNX:no exudate, no erythema and lips, buccal mucosa, and tongue normal  NECK: supple, thyroid normal size, non-tender, without nodularity LYMPH:  no palpable lymphadenopathy in the cervical, axillary or inguinal LUNGS: clear to auscultation and percussion with normal breathing effort HEART: regular rate & rhythm and no murmurs with mild bilateral lower extremity edema ABDOMEN:abdomen soft, non-tender and normal bowel sounds Musculoskeletal:no cyanosis of digits and no clubbing  NEURO: alert & oriented x 3 with fluent speech, no focal motor/sensory deficits  LABORATORY DATA:  I have reviewed the data as listed    Component Value Date/Time   NA 139 05/30/2017 0834   NA 139 11/29/2016 0829   K 4.4 05/30/2017 0834   K 4.3 11/29/2016 0829   CL 106 05/30/2017 0834   CO2 25 05/30/2017 0834   CO2 24 11/29/2016 0829   GLUCOSE 110 05/30/2017 0834   GLUCOSE 101 11/29/2016 0829   BUN 20 05/30/2017 0834   BUN 17.0 11/29/2016 0829   CREATININE 1.09 05/30/2017 0834   CREATININE 0.9 11/29/2016 0829   CALCIUM 9.2 05/30/2017 0834   CALCIUM 9.1 11/29/2016 0829   PROT 6.7 05/30/2017 0834   PROT 7.0 11/29/2016 0829   ALBUMIN 3.6 05/30/2017 0834   ALBUMIN 3.8 11/29/2016 0829    AST 20 05/30/2017 0834   AST 17 11/29/2016 0829   ALT 20 05/30/2017 0834   ALT 16 11/29/2016 0829   ALKPHOS 60 05/30/2017 0834   ALKPHOS 63 11/29/2016 0829   BILITOT 0.6 05/30/2017 0834   BILITOT 0.83 11/29/2016 0829   GFRNONAA >60 05/30/2017 0834   GFRAA >60 05/30/2017 0834    No results found for: SPEP, UPEP  Lab Results  Component Value Date   WBC 3.2 (L) 05/30/2017   NEUTROABS 1.4 (L) 05/30/2017   HGB 13.1 02/02/2017   HCT 38.6 05/30/2017   MCV 92.9 05/30/2017   PLT 169 05/30/2017      Chemistry      Component Value Date/Time   NA 139 05/30/2017 0834   NA 139 11/29/2016 0829   K 4.4 05/30/2017 0834   K 4.3 11/29/2016 0829   CL 106 05/30/2017 0834   CO2 25 05/30/2017 0834   CO2 24 11/29/2016 0829   BUN 20 05/30/2017 0834   BUN 17.0 11/29/2016 0829   CREATININE 1.09 05/30/2017 0834   CREATININE 0.9 11/29/2016 0829      Component Value Date/Time   CALCIUM 9.2 05/30/2017 0834   CALCIUM 9.1 11/29/2016 0829   ALKPHOS 60 05/30/2017 0834   ALKPHOS 63 11/29/2016 0829   AST 20 05/30/2017 0834   AST 17 11/29/2016 0829   ALT 20 05/30/2017 0834   ALT 16 11/29/2016 0829   BILITOT 0.6 05/30/2017 0834   BILITOT 0.83 11/29/2016 0829      All questions were answered. The patient knows to call the clinic with any problems, questions or concerns. No barriers to learning was detected.  I spent 15 minutes counseling the patient face to face. The total time spent in the appointment was 20 minutes and more than 50% was on counseling and review of test results  Heath Lark, MD 06/06/2017 10:51 AM

## 2017-06-06 NOTE — Assessment & Plan Note (Signed)
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 He will continue on reduced dose Sprycel at 50 mg daily by mouth I would not recommend further dose adjustment or stopping his treatment because of documented resistance to Gleevec.  He does not fulfill the criteria for discontinuation of TKI based on current guidelines I will see him back in 6 months He is educated to watch out for signs and symptoms of side effects of treatment such as fluid retention, shortness of breath or infection 

## 2017-06-06 NOTE — Assessment & Plan Note (Signed)
He is not symptomatic with mild pancytopenia due to his treatment We will continue same dose without dose adjustment 

## 2017-06-06 NOTE — Assessment & Plan Note (Signed)
The patient is on medical management There is no contraindication for him to be on anticoagulation therapy indefinitely

## 2017-06-06 NOTE — Telephone Encounter (Signed)
Gave avs and calendar ° °

## 2017-06-10 ENCOUNTER — Other Ambulatory Visit: Payer: Self-pay | Admitting: Cardiovascular Disease

## 2017-06-10 NOTE — Telephone Encounter (Signed)
Eliquis 5mg  refill request received; pt is 67 yrs old, wt-99.4kg, Crea-1.09 on 05/30/17, last seen By Dr. Curt Bears on 04/25/17; pt called as well to ensure refill could be done as he is going out of town next. Will send in refill to requested pharmacy.

## 2017-06-13 LAB — BCR/ABL

## 2017-07-12 ENCOUNTER — Telehealth: Payer: Self-pay | Admitting: Pharmacy Technician

## 2017-07-12 NOTE — Telephone Encounter (Signed)
Oral Oncology Patient Advocate Encounter  Prior Authorization for Sprycel has been approved.    PA# [id number] Effective dates: 07/12/2017 through 07/13/2018  Oral Oncology Clinic will continue to follow.   Fabio Asa. Melynda Keller, [place] Patient [place] [phone] 07/12/2017 2:48 PM

## 2017-07-12 NOTE — Telephone Encounter (Signed)
Oral Oncology Patient Advocate Encounter  Received notification from [employer] that prior authorization for Sprycel is in need of renewal.   PA submitted on CoverMyMeds Key [id number] Status is pending  Oral Oncology Clinic will continue to follow.  Fabio Asa. Melynda Keller, [place] Patient [place] [phone] 07/12/2017 9:27 AM

## 2017-08-21 ENCOUNTER — Other Ambulatory Visit: Payer: Self-pay | Admitting: Cardiology

## 2017-09-01 ENCOUNTER — Other Ambulatory Visit: Payer: Self-pay | Admitting: Cardiology

## 2017-10-03 ENCOUNTER — Ambulatory Visit: Payer: BLUE CROSS/BLUE SHIELD | Admitting: Cardiology

## 2017-10-03 ENCOUNTER — Encounter: Payer: Self-pay | Admitting: Cardiology

## 2017-10-03 VITALS — BP 142/78 | HR 86 | Ht 74.0 in | Wt 211.4 lb

## 2017-10-03 DIAGNOSIS — I10 Essential (primary) hypertension: Secondary | ICD-10-CM

## 2017-10-03 DIAGNOSIS — I481 Persistent atrial fibrillation: Secondary | ICD-10-CM | POA: Diagnosis not present

## 2017-10-03 DIAGNOSIS — I428 Other cardiomyopathies: Secondary | ICD-10-CM

## 2017-10-03 DIAGNOSIS — I4819 Other persistent atrial fibrillation: Secondary | ICD-10-CM

## 2017-10-03 NOTE — Progress Notes (Signed)
Cardiology Office Note:    Date:  10/03/2017   ID:  Larry Vasquez, DOB September 13, 1950, MRN [id number]  PCP:  [name], MD  Cardiologist:  Constance Haw, MD   Referring MD: [name], MD     History of Present Illness:    Larry Vasquez is a 67 y.o. male with persistent atrial fibrillation, Dr. Curt Bears, prior admission 06/04/2016, CML, EF previously 35 to 40% thought to be tachycardia mediated since repeat echo shows EF of 50 to 55%, cardioversion 06/24/2016, ablation on 10/08/2016, right groin hematoma postop.  No further atrial fibrillation.  Left inguinal hernia.  Overall doing quite well, no chest pain fevers chills nausea vomiting shortness of breath.  No bleeding.  Past Medical History:  Diagnosis Date  . Cataract    Left eye  . CML (chronic myelocytic leukemia) ([place])   . Leukocytosis, unspecified 10/17/2012  . Thrombocytosis ([place]) 10/17/2012  . White coat hypertension 06/06/2014    Past Surgical History:  Procedure Laterality Date  . ATRIAL FIBRILLATION ABLATION [name] 10/08/2016   Procedure: Atrial Fibrillation Ablation;  Surgeon: Constance Haw, MD;  Location: [facility];  Service: Cardiovascular;  Laterality: [name];  . CARDIOVERSION [name] 06/24/2016   Procedure: CARDIOVERSION;  Surgeon: [name], MD;  Location: [facility] ENDOSCOPY;  Service: Cardiovascular;  Laterality: [name];  . CATARACT EXTRACTION Left 01/01/2013  . EYE SURGERY  2012   Detached Retina  . HERNIA REPAIR    . JOINT REPLACEMENT  2006   Total Knee Left  . TONSILLECTOMY      Current Medications: Current Meds  Medication Sig  . acetaminophen (TYLENOL) 500 MG tablet Take 1,000 mg by mouth every 6 (six) hours as needed for headache (pain).  . APPLE CIDER VINEGAR PO Take 15 mLs by mouth every other day.  . Cholecalciferol (VITAMIN D PO) Take 5,000 Units by mouth daily.  . Coenzyme Q10 (CO Q-10) 100 MG CAPS Take 100 mg by mouth daily.  . Digestive Enzymes CAPS Take 1 capsule by mouth daily  with lunch.   Marland Kitchen ELIQUIS 5 MG TABS tablet TAKE 1 TABLET BY MOUTH TWICE A DAY  . losartan (COZAAR) 25 MG tablet TAKE 1 TABLET BY MOUTH EVERY DAY  . magnesium oxide (MAG-OX) 400 MG tablet Take 400 mg by mouth at bedtime.   . metoprolol (TOPROL-XL) 200 MG 24 hr tablet TAKE 1 TABLET (200 MG TOTAL) BY MOUTH DAILY. TAKE WITH OR IMMEDIATELY FOLLOWING A MEAL.  Marland Kitchen MILK THISTLE PO Take 1 capsule by mouth 4 (four) times a week.   . Multiple Vitamin (MULTIVITAMIN WITH MINERALS) TABS tablet Take 1 tablet by mouth daily.  Marland Kitchen OVER THE COUNTER MEDICATION Take 1 oz by mouth See admin instructions. ASEA dietary supplement: Drink 1 ounce by mouth up to twice daily.  . SPRYCEL 50 MG tablet TAKE 1 TABLET (50 MG) BY MOUTH ONCE DAILY AT THE SAME TIME. MAY TAKE WITH OR WITHOUT FOOD. SWALLOW WHOLE. AVOID GRAPEFRUIT PRODUCTS.  Marland Kitchen TURMERIC CURCUMIN PO Take 2-3 capsules by mouth daily.      Allergies:   Corticosteroids   Social History   Socioeconomic History  . Marital status: Married    Spouse name: Not on file  . Number of children: Not on file  . Years of education: Not on file  . Highest education level: Not on file  Occupational History  . Not on file  Social Needs  . Financial resource strain: Not on file  . Food insecurity:  Worry: Not on file    Inability: Not on file  . Transportation needs:    Medical: Not on file    Non-medical: Not on file  Tobacco Use  . Smoking status: Never Smoker  . Smokeless tobacco: Never Used  Substance and Sexual Activity  . Alcohol use: Yes    Alcohol/week: 4.2 oz    Types: 7 Glasses of wine per week  . Drug use: No  . Sexual activity: Yes    Birth control/protection: None  Lifestyle  . Physical activity:    Days per week: Not on file    Minutes per session: Not on file  . Stress: Not on file  Relationships  . Social connections:    Talks on phone: Not on file    Gets together: Not on file    Attends religious service: Not on file    Active member of club  or organization: Not on file    Attends meetings of clubs or organizations: Not on file    Relationship status: Not on file  Other Topics Concern  . Not on file  Social History Narrative  . Not on file     Family History: The patient's family history includes Arthritis in his mother; Atrial fibrillation in his father; CAD in his father; Cancer in his father; Chronic Renal Failure in his father; Diabetes in his father; Healthy in his brother and brother; Hypertension in his father and mother.  ROS:   Please see the history of present illness.     All other systems reviewed and are negative.  EKGs/Labs/Other Studies Reviewed:    The following studies were reviewed today: Prior echocardiogram, EKG, lab work, office notes reviewed  EKG:  None today  Recent Labs: 05/30/2017: ALT 20; BUN 20; Creatinine 1.09; Hemoglobin 12.8; Platelet Count 169; Potassium 4.4; Sodium 139  Recent Lipid Panel No results found for: CHOL, TRIG, HDL, CHOLHDL, VLDL, LDLCALC, LDLDIRECT  Physical Exam:    VS:  BP (!) 142/78   Pulse 86   Ht 6\' 2"  (1.88 m)   Wt 211 lb 6.4 oz (95.9 kg)   SpO2 93%   BMI 27.14 kg/m     Wt Readings from Last 3 Encounters:  10/03/17 211 lb 6.4 oz (95.9 kg)  06/06/17 219 lb 1.6 oz (99.4 kg)  04/25/17 216 lb (98 kg)     GEN:  Well nourished, well developed in no acute distress HEENT: Normal NECK: No JVD; No carotid bruits LYMPHATICS: No lymphadenopathy CARDIAC: RRR, no murmurs, rubs, gallops RESPIRATORY:  Clear to auscultation without rales, wheezing or rhonchi  ABDOMEN: Soft, non-tender, non-distended MUSCULOSKELETAL:  No edema; No deformity  SKIN: Warm and dry NEUROLOGIC:  Alert and oriented x 3 PSYCHIATRIC:  Normal affect   ASSESSMENT:    1. Nonischemic cardiomyopathy (HCC)   2. Persistent atrial fibrillation ([place])   3. Essential hypertension    PLAN:    In order of problems listed above:  Paroxysmal atrial fibrillation status post ablation 2018 with  chronic anticoagulation -Doing very well, no recurrence.  For now, let us continue with Eliquis in case atrial for ablation were to return.  Certainly, in a future date this can be a to a discussion on whether or not cessation should occur.  Of course if it were to return and he were not on Eliquis, this could increase his risk for embolic stroke.  Tachycardia mediated cardiomyopathy -Resolved ablation - On losartan 25 and Toprol XL 200 mg daily.  Continue.  No changes.  No side effects.  Essential hypertension -Minimally elevated today, overall letter good control.  Usually a little bit high when going to the doctor.   Medication Adjustments/Labs and Tests Ordered: Current medicines are reviewed at length with the patient today.  Concerns regarding medicines are outlined above.  No orders of the defined types were placed in this encounter.  No orders of the defined types were placed in this encounter.   Patient Instructions  Medication Instructions: No changes    Labwork: None Ordered    Testing/Procedures: None Ordered    Follow-Up: Your physician recommends that you schedule a follow-up appointment in: One year with Dr. Marlou Porch     Any Other Special Instructions Will Be Listed Below (If Applicable).     If you need a refill on your cardiac medications before your next appointment, please call your pharmacy.     Signed, Candee Furbish, MD  10/03/2017 8:48 AM    [facility]

## 2017-10-03 NOTE — Patient Instructions (Signed)
Medication Instructions: No changes    Labwork: None Ordered    Testing/Procedures: None Ordered    Follow-Up: Your physician recommends that you schedule a follow-up appointment in: One year with Dr. Marlou Porch     Any Other Special Instructions Will Be Listed Below (If Applicable).     If you need a refill on your cardiac medications before your next appointment, please call your pharmacy.

## 2017-11-21 DIAGNOSIS — M1711 Unilateral primary osteoarthritis, right knee: Secondary | ICD-10-CM | POA: Insufficient documentation

## 2017-11-29 ENCOUNTER — Inpatient Hospital Stay: Payer: BLUE CROSS/BLUE SHIELD | Attending: Hematology and Oncology

## 2017-11-29 DIAGNOSIS — C9210 Chronic myeloid leukemia, BCR/ABL-positive, not having achieved remission: Secondary | ICD-10-CM | POA: Diagnosis not present

## 2017-11-29 LAB — CBC WITH DIFFERENTIAL/PLATELET
Basophils Absolute: 0 10*3/uL (ref 0.0–0.1)
Basophils Relative: 1 %
EOS ABS: 0.1 10*3/uL (ref 0.0–0.5)
EOS PCT: 5 %
HCT: 38.8 % (ref 38.4–49.9)
Hemoglobin: 12.9 g/dL — ABNORMAL LOW (ref 13.0–17.1)
LYMPHS ABS: 0.8 10*3/uL — AB (ref 0.9–3.3)
LYMPHS PCT: 29 %
MCH: 31 pg (ref 27.2–33.4)
MCHC: 33.3 g/dL (ref 32.0–36.0)
MCV: 92.9 fL (ref 79.3–98.0)
MONOS PCT: 12 %
Monocytes Absolute: 0.3 10*3/uL (ref 0.1–0.9)
Neutro Abs: 1.4 10*3/uL — ABNORMAL LOW (ref 1.5–6.5)
Neutrophils Relative %: 53 %
PLATELETS: 159 10*3/uL (ref 140–400)
RBC: 4.17 MIL/uL — ABNORMAL LOW (ref 4.20–5.82)
RDW: 13.6 % (ref 11.0–14.6)
WBC: 2.7 10*3/uL — ABNORMAL LOW (ref 4.0–10.3)

## 2017-11-29 LAB — COMPREHENSIVE METABOLIC PANEL
ALT: 19 U/L (ref 0–44)
ANION GAP: 10 (ref 5–15)
AST: 22 U/L (ref 15–41)
Albumin: 3.8 g/dL (ref 3.5–5.0)
Alkaline Phosphatase: 66 U/L (ref 38–126)
BUN: 16 mg/dL (ref 8–23)
CHLORIDE: 105 mmol/L (ref 98–111)
CO2: 24 mmol/L (ref 22–32)
CREATININE: 1.05 mg/dL (ref 0.61–1.24)
Calcium: 9.2 mg/dL (ref 8.9–10.3)
Glucose, Bld: 116 mg/dL — ABNORMAL HIGH (ref 70–99)
Potassium: 4.4 mmol/L (ref 3.5–5.1)
SODIUM: 139 mmol/L (ref 135–145)
TOTAL PROTEIN: 6.9 g/dL (ref 6.5–8.1)
Total Bilirubin: 0.5 mg/dL (ref 0.3–1.2)

## 2017-12-06 ENCOUNTER — Telehealth: Payer: Self-pay | Admitting: Hematology and Oncology

## 2017-12-06 ENCOUNTER — Encounter: Payer: Self-pay | Admitting: Hematology and Oncology

## 2017-12-06 ENCOUNTER — Inpatient Hospital Stay: Payer: BLUE CROSS/BLUE SHIELD | Attending: Hematology and Oncology | Admitting: Hematology and Oncology

## 2017-12-06 DIAGNOSIS — C9211 Chronic myeloid leukemia, BCR/ABL-positive, in remission: Secondary | ICD-10-CM | POA: Insufficient documentation

## 2017-12-06 DIAGNOSIS — Z79899 Other long term (current) drug therapy: Secondary | ICD-10-CM | POA: Insufficient documentation

## 2017-12-06 DIAGNOSIS — I10 Essential (primary) hypertension: Secondary | ICD-10-CM | POA: Insufficient documentation

## 2017-12-06 DIAGNOSIS — C9210 Chronic myeloid leukemia, BCR/ABL-positive, not having achieved remission: Secondary | ICD-10-CM

## 2017-12-06 NOTE — Telephone Encounter (Signed)
Gave avs and calendar ° °

## 2017-12-06 NOTE — Assessment & Plan Note (Signed)
His blood pressure is elevated but the patient is not symptomatic. 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 We will observe only.  He will continue medical management with other antihypertensives as directed The patient is noted to have gained some weight We discussed the importance of weight loss and lifestyle modification along with dietary changes

## 2017-12-06 NOTE — Progress Notes (Signed)
[facility] OFFICE PROGRESS NOTE  Patient Care Team: [name], MD as PCP - General (Family Medicine) Constance Haw, MD as PCP - Cardiology (Cardiology)  ASSESSMENT & PLAN:  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[place]) Recent BCR/ABL showed he is in remission. He will continue on reduced dose Sprycel at 50 mg daily by mouth I would not recommend further dose adjustment or stopping his treatment because of documented resistance to [place].  He does not fulfill the criteria for discontinuation of TKI based on current guidelines I will see him back in 6 months He is educated to watch out for signs and symptoms of side effects of treatment such as fluid retention, shortness of breath or infection  White coat syndrome with hypertension His blood pressure is elevated but the patient is not symptomatic. 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 We will observe only.  He will continue medical management with other antihypertensives as directed The patient is noted to have gained some weight We discussed the importance of weight loss and lifestyle modification along with dietary changes   No orders of the defined types were placed in this encounter.   INTERVAL HISTORY: Please see below for problem oriented charting. He returns for further follow-up He is doing well He is compliant taking Sprycel as directed Denies recent infection, fever or chills He has declined influenza vaccination No recent diarrhea, shortness of breath or fluid retention, although he noticed some mild leg swelling at the end of the day  SUMMARY OF ONCOLOGIC HISTORY: Oncology History   Physical     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[place])   11/01/2012 Bone Marrow Biopsy    Bone marrow aspirate and biopsy was performed for leukocytosis and thrombocytosis. 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    11/07/2012 - 08/09/2013 Chemotherapy    He was  started on Gleevec 400 mg daily.    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    08/22/2013 -  Chemotherapy    He is started on dasatinib.    11/19/2013 Tumor Marker    BCR/ABL is improving    02/22/2014 Pathology Results    BCR/ABL continues to improve and the patient is moving towards molecular remission    05/27/2014 Pathology Results    BCR/ABL b2a2 & b3a2 both are 0.05%, b2a2 & b3a2 IS both are 0.028%. Patient in MMR    09/26/2014 Pathology Results    Repeat BCR/ABL was not detectable    02/28/2015 Tumor Marker    Repeat BCR/ABL was not detectable    08/28/2015 Tumor Marker    Repeat BCR/ABL was not detectable    01/06/2016 Pathology Results    Repeat BCR/ABL was not detectable    04/15/2016 Pathology Results    Repeat BCR/ABL was not detectable    07/22/2016 Pathology Results    Repeat BCR/ABL was not detectable    11/29/2016 Pathology Results    Repeat BCR/ABL was not detectable    05/30/2017 Pathology Results    Repeat BCR/ABL was not detectable    11/29/2017 Pathology Results    Repeat BCR/ABL was not detectable     REVIEW OF SYSTEMS:   Constitutional: Denies fevers, chills or abnormal  weight loss Eyes: Denies blurriness of vision Ears, nose, mouth, throat, and face: Denies mucositis or sore throat Respiratory: Denies cough, dyspnea or wheezes Cardiovascular: Denies palpitation, chest discomfort  Gastrointestinal:  Denies nausea, heartburn or change in bowel habits Skin: Denies abnormal skin rashes Lymphatics: Denies new lymphadenopathy or easy bruising Neurological:Denies numbness, tingling or new weaknesses Behavioral/Psych: Mood is stable, no new changes  All other systems were reviewed with the patient and are  negative.  I have reviewed the past medical history, past surgical history, social history and family history with the patient and they are unchanged from previous note.  ALLERGIES:  is allergic to corticosteroids.  MEDICATIONS:  Current Outpatient Medications  Medication Sig Dispense Refill  . acetaminophen (TYLENOL) 500 MG tablet Take 1,000 mg by mouth every 6 (six) hours as needed for headache (pain).    . APPLE CIDER VINEGAR PO Take 15 mLs by mouth every other day.    . Cholecalciferol (VITAMIN D PO) Take 5,000 Units by mouth daily.    . Coenzyme Q10 (CO Q-10) 100 MG CAPS Take 100 mg by mouth daily.    . Digestive Enzymes CAPS Take 1 capsule by mouth daily with lunch.     Marland Kitchen ELIQUIS 5 MG TABS tablet TAKE 1 TABLET BY MOUTH TWICE A DAY 60 tablet 9  . losartan (COZAAR) 25 MG tablet TAKE 1 TABLET BY MOUTH EVERY DAY 90 tablet 2  . magnesium oxide (MAG-OX) 400 MG tablet Take 400 mg by mouth at bedtime.     . metoprolol (TOPROL-XL) 200 MG 24 hr tablet TAKE 1 TABLET (200 MG TOTAL) BY MOUTH DAILY. TAKE WITH OR IMMEDIATELY FOLLOWING A MEAL. 90 tablet 2  . MILK THISTLE PO Take 1 capsule by mouth 4 (four) times a week.     . Multiple Vitamin (MULTIVITAMIN WITH MINERALS) TABS tablet Take 1 tablet by mouth daily.    Marland Kitchen OVER THE COUNTER MEDICATION Take 1 oz by mouth See admin instructions. ASEA dietary supplement: Drink 1 ounce by mouth up to twice daily.    . SPRYCEL 50 MG tablet TAKE 1 TABLET (50 MG) BY MOUTH ONCE DAILY AT THE SAME TIME. MAY TAKE WITH OR WITHOUT FOOD. SWALLOW WHOLE. AVOID GRAPEFRUIT PRODUCTS. 90 tablet 2  . TURMERIC CURCUMIN PO Take 2-3 capsules by mouth daily.      No current facility-administered medications for this visit.     PHYSICAL EXAMINATION: ECOG PERFORMANCE STATUS: 1 - Symptomatic but completely ambulatory  Vitals:   12/06/17 0834  BP: (!) 175/78  Pulse: (!) 58  Resp: 18  Temp: 98.3 F (36.8 C)  SpO2: 99%   Filed Weights   12/06/17 0834  Weight: 214 lb 6.4 oz  (97.3 kg)    GENERAL:alert, no distress and comfortable SKIN: skin color, texture, turgor are normal, no rashes or significant lesions EYES: normal, Conjunctiva are pink and non-injected, sclera clear OROPHARYNX:no exudate, no erythema and lips, buccal mucosa, and tongue normal  NECK: supple, thyroid normal size, non-tender, without nodularity LYMPH:  no palpable lymphadenopathy in the cervical, axillary or inguinal LUNGS: clear to auscultation and percussion with normal breathing effort HEART: regular rate & rhythm and no murmurs and no lower extremity edema ABDOMEN:abdomen soft, non-tender and normal bowel sounds Musculoskeletal:no cyanosis of digits and no clubbing  NEURO: alert & oriented x 3 with fluent speech, no focal motor/sensory deficits  LABORATORY DATA:  I have reviewed the data as listed    Component Value Date/Time   NA 139 11/29/2017 0743  NA 139 11/29/2016 0829   K 4.4 11/29/2017 0743   K 4.3 11/29/2016 0829   CL 105 11/29/2017 0743   CO2 24 11/29/2017 0743   CO2 24 11/29/2016 0829   GLUCOSE 116 (H) 11/29/2017 0743   GLUCOSE 101 11/29/2016 0829   BUN 16 11/29/2017 0743   BUN 17.0 11/29/2016 0829   CREATININE 1.05 11/29/2017 0743   CREATININE 1.09 05/30/2017 0834   CREATININE 0.9 11/29/2016 0829   CALCIUM 9.2 11/29/2017 0743   CALCIUM 9.1 11/29/2016 0829   PROT 6.9 11/29/2017 0743   PROT 7.0 11/29/2016 0829   ALBUMIN 3.8 11/29/2017 0743   ALBUMIN 3.8 11/29/2016 0829   AST 22 11/29/2017 0743   AST 20 05/30/2017 0834   AST 17 11/29/2016 0829   ALT 19 11/29/2017 0743   ALT 20 05/30/2017 0834   ALT 16 11/29/2016 0829   ALKPHOS 66 11/29/2017 0743   ALKPHOS 63 11/29/2016 0829   BILITOT 0.5 11/29/2017 0743   BILITOT 0.6 05/30/2017 0834   BILITOT 0.83 11/29/2016 0829   GFRNONAA >60 11/29/2017 0743   GFRNONAA >60 05/30/2017 0834   GFRAA >60 11/29/2017 0743   GFRAA >60 05/30/2017 0834    No results found for: SPEP, UPEP  Lab Results  Component Value  Date   WBC 2.7 (L) 11/29/2017   NEUTROABS 1.4 (L) 11/29/2017   HGB 12.9 (L) 11/29/2017   HCT 38.8 11/29/2017   MCV 92.9 11/29/2017   PLT 159 11/29/2017      Chemistry      Component Value Date/Time   NA 139 11/29/2017 0743   NA 139 11/29/2016 0829   K 4.4 11/29/2017 0743   K 4.3 11/29/2016 0829   CL 105 11/29/2017 0743   CO2 24 11/29/2017 0743   CO2 24 11/29/2016 0829   BUN 16 11/29/2017 0743   BUN 17.0 11/29/2016 0829   CREATININE 1.05 11/29/2017 0743   CREATININE 1.09 05/30/2017 0834   CREATININE 0.9 11/29/2016 0829      Component Value Date/Time   CALCIUM 9.2 11/29/2017 0743   CALCIUM 9.1 11/29/2016 0829   ALKPHOS 66 11/29/2017 0743   ALKPHOS 63 11/29/2016 0829   AST 22 11/29/2017 0743   AST 20 05/30/2017 0834   AST 17 11/29/2016 0829   ALT 19 11/29/2017 0743   ALT 20 05/30/2017 0834   ALT 16 11/29/2016 0829   BILITOT 0.5 11/29/2017 0743   BILITOT 0.6 05/30/2017 0834   BILITOT 0.83 11/29/2016 0829     All questions were answered. The patient knows to call the clinic with any problems, questions or concerns. No barriers to learning was detected.  I spent 10 minutes counseling the patient face to face. The total time spent in the appointment was 15 minutes and more than 50% was on counseling and review of test results  Heath Lark, MD 12/06/2017 2:28 PM

## 2017-12-06 NOTE — Assessment & Plan Note (Signed)
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 He will continue on reduced dose Sprycel at 50 mg daily by mouth I would not recommend further dose adjustment or stopping his treatment because of documented resistance to [place].  He does not fulfill the criteria for discontinuation of TKI based on current guidelines I will see him back in 6 months He is educated to watch out for signs and symptoms of side effects of treatment such as fluid retention, shortness of breath or infection

## 2017-12-09 ENCOUNTER — Telehealth: Payer: Self-pay | Admitting: *Deleted

## 2017-12-09 LAB — BCR/ABL

## 2017-12-09 NOTE — Telephone Encounter (Signed)
   [facility] Pre-operative Risk Assessment    Request for surgical clearance:  1. What type of surgery is being performed? Right TKA   2. When is this surgery scheduled? 01-11-2018  3. What type of clearance is required (medical clearance vs. Pharmacy clearance to hold med vs. Both)? Both  4. Are there any medications that need to be held prior to surgery and how long? Pending Provider Clearance  5. Practice name and name of physician performing surgery? [facility], Dr. Len Childs  6. What is your office phone number  [phone], [phone]   7.   What is your office fax number  [phone]  8.   Anesthesia type (None, local, MAC, general) ? Spinal   Larry Vasquez 12/09/2017, 3:36 PM  _________________________________________________________________   (provider comments below)

## 2017-12-12 NOTE — Telephone Encounter (Signed)
Pt takes Eliquis for afib with CHADS2VASc score of 3 (age, HTN, CHF). Pt s/p ablation 10/2016 with no afib recurrence. Renal function is normal. Ok to hold Eliquis for 3 days prior to TKA per protocol.

## 2017-12-12 NOTE — Telephone Encounter (Signed)
Dr. Marlou Porch pt need clearance for TKA.  I saw you have recently seen and I do have a phone call to pt.  Reviewed cardiac CT pt had and wondered if any other testing berfore  Surgery?  Thank you.

## 2017-12-12 NOTE — Telephone Encounter (Signed)
Okay to hold Eliquis for 3 days prior to total knee replacement.  Resume when able.  Okay to proceed with low overall cardiac risk. Larry Furbish, MD

## 2017-12-13 ENCOUNTER — Telehealth: Payer: Self-pay | Admitting: Cardiology

## 2017-12-13 NOTE — Telephone Encounter (Signed)
LMOVM TO CONTACT  SURGICAL CLEARANCE DEPT BACK.

## 2017-12-13 NOTE — Telephone Encounter (Signed)
New message:      Pt is returning a call for the pre-op team.

## 2017-12-13 NOTE — Telephone Encounter (Addendum)
°  Please see previous telephone encounter. Pre op clearance  Patient returning call. Pre op team unavailable  Please call again

## 2017-12-13 NOTE — Telephone Encounter (Signed)
° ° °  Please return call to patient °

## 2017-12-13 NOTE — Telephone Encounter (Signed)
Pt has appt tomorrow for palpitations and pre-op clearance. Will eval for surgery at that time.

## 2017-12-14 NOTE — Telephone Encounter (Signed)
Left voice mail to call between pre-op hours to go over cardiac symptoms.

## 2017-12-14 NOTE — Telephone Encounter (Signed)
Follow up   Patient is returning call in reference to clearance.

## 2017-12-14 NOTE — Telephone Encounter (Signed)
Patient returned call. He is very active and easily getting > 4 mets of activity.   Given past medical history and time since last visit, based on ACC/AHA guidelines, [place] would be at acceptable risk for the planned procedure without further cardiovascular testing.   I will route this recommendation to the requesting party via Epic fax function and remove from pre-op pool.  Please call with questions.  [place], [place] 12/14/2017, 3:04 PM

## 2018-01-16 ENCOUNTER — Other Ambulatory Visit: Payer: Self-pay | Admitting: Hematology and Oncology

## 2018-04-10 ENCOUNTER — Other Ambulatory Visit: Payer: Self-pay

## 2018-04-13 ENCOUNTER — Other Ambulatory Visit: Payer: Self-pay | Admitting: Cardiology

## 2018-04-18 ENCOUNTER — Other Ambulatory Visit: Payer: Self-pay | Admitting: Hematology and Oncology

## 2018-04-18 NOTE — Telephone Encounter (Signed)
[place] patient

## 2018-05-15 ENCOUNTER — Other Ambulatory Visit: Payer: Self-pay | Admitting: Cardiovascular Disease

## 2018-05-30 ENCOUNTER — Telehealth: Payer: Self-pay | Admitting: Hematology and Oncology

## 2018-05-30 NOTE — Telephone Encounter (Signed)
Cancelled appt per 3/24 sch message - left message for patient to call back to r/s is needed.

## 2018-06-01 ENCOUNTER — Other Ambulatory Visit: Payer: BLUE CROSS/BLUE SHIELD

## 2018-06-02 ENCOUNTER — Telehealth: Payer: Self-pay | Admitting: Hematology and Oncology

## 2018-06-02 NOTE — Telephone Encounter (Signed)
R/s appt per 3/25 sch message - unable to reach patient . Left message with new appt date and time

## 2018-06-05 ENCOUNTER — Other Ambulatory Visit: Payer: Self-pay | Admitting: Cardiology

## 2018-06-05 MED ORDER — METOPROLOL SUCCINATE ER 200 MG PO TB24: 200.0000 mg | ORAL_TABLET | Freq: Every day | ORAL | 1 refills | Status: DC

## 2018-06-08 ENCOUNTER — Ambulatory Visit: Payer: BLUE CROSS/BLUE SHIELD | Admitting: Hematology and Oncology

## 2018-09-11 ENCOUNTER — Telehealth: Payer: Self-pay

## 2018-09-11 NOTE — Telephone Encounter (Signed)
Oral Oncology Patient Advocate Encounter  Prior Authorization for Sprycel has been approved.    Effective dates: 09/11/18 through 09/11/19  Oral Oncology Clinic will continue to follow.   [place] Patient [place] Phone [phone] Fax [phone] 09/11/2018    12:41 PM

## 2018-09-11 NOTE — Telephone Encounter (Signed)
Oral Oncology Patient Advocate Encounter  Received notification from [employer] that the existing prior authorization for Sprycel is due for renewal.  Renewal PA submitted by fax to [phone] Status is pending  Oral Oncology Clinic will continue to follow.  [place] Patient [place] Phone [phone] Fax [phone] 09/11/2018    10:28 AM

## 2018-09-19 IMAGING — CT CT HEART MORPH/PULM VEIN W/ CM & W/O CA SCORE
2 of 5 series · 11 of 20 positions shown, 13 images · IV contrast (APPLIED)
Comparison: None.

CLINICAL DATA: Pre Ablation

EXAM:
Cardiac CTA
MEDICATIONS:
Lopressor 5mg
TECHNIQUE: The patient was scanned on a Siemens [REDACTED]ice Force scanner. Gantry
rotation speed was 250 msecs. Collimation was.6 mm a 100 kV
prospective scan was triggered in the ascending thoracic aorta at
140 HU's Patient was in rapid erratic atrial fibrillation during
scan The 3D data set was interpreted on a dedicated work station
using MPR, MIP and VRT modes. A total of 80cc of contrast was used.

[Series 8: best diast 297 ms · axial · 0.44mm/px · z∈[+1198,+1332]mm · 8 of 432 slices shown, 10 images]
[im 48/432  vessel]
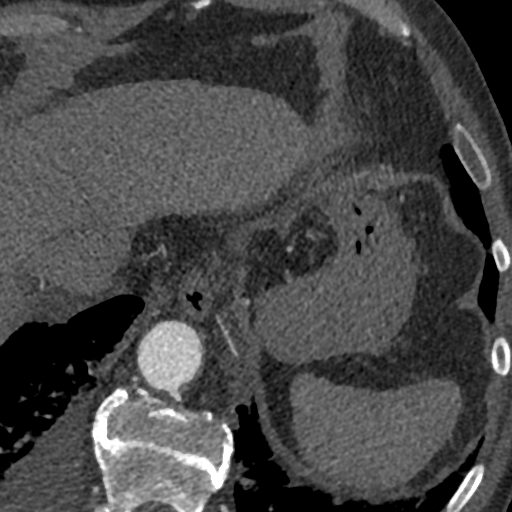
[im 48/432  lung]
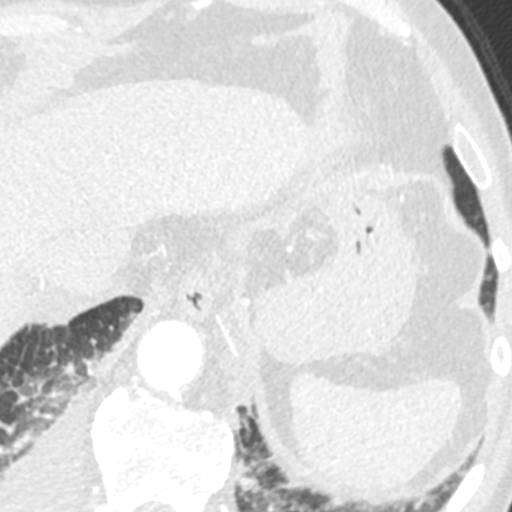
[im 96/432  vessel]
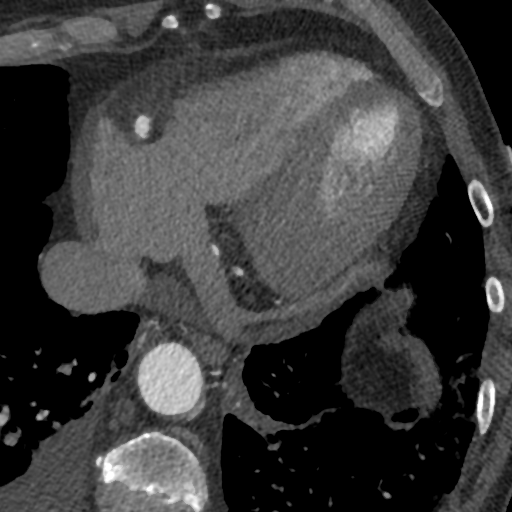
[im 144/432  vessel]
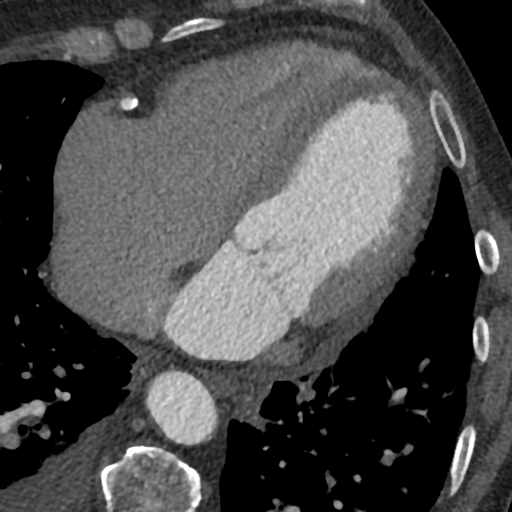
[im 192/432  vessel]
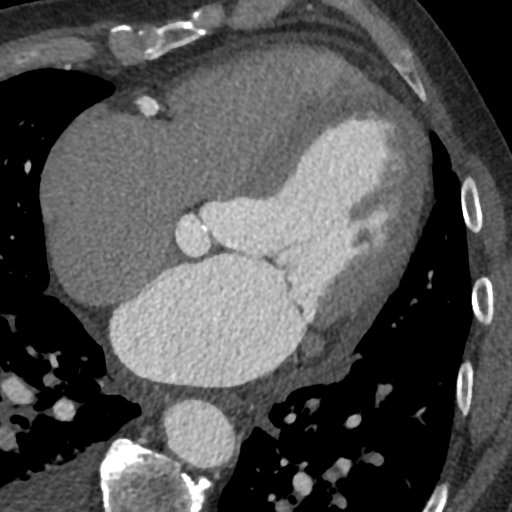
[im 240/432  vessel]
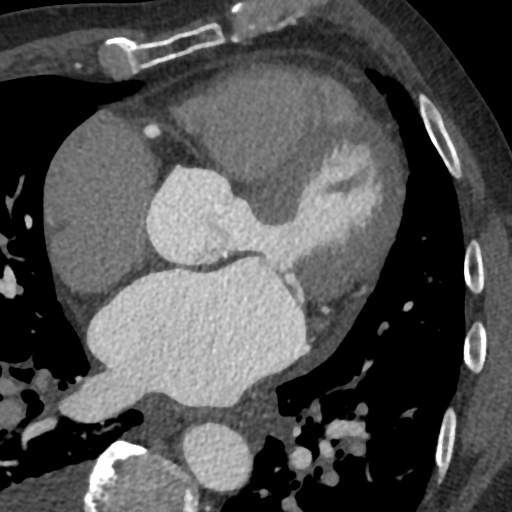
[im 240/432  lung]
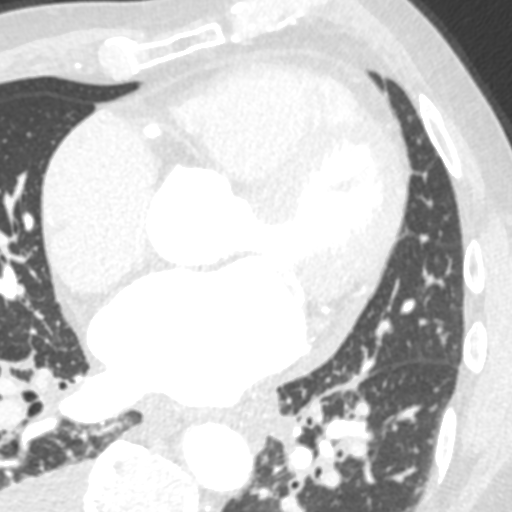
[im 288/432  vessel]
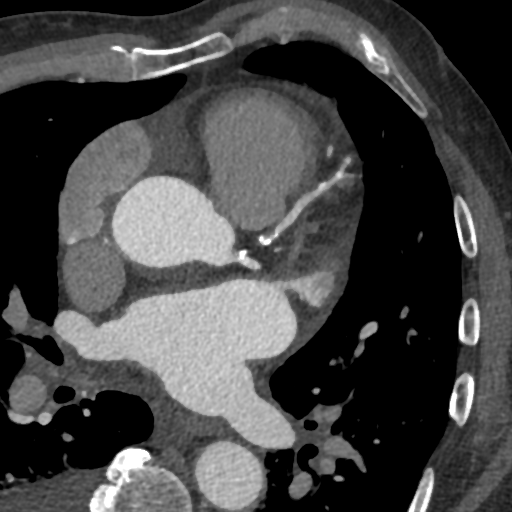
[im 336/432  vessel]
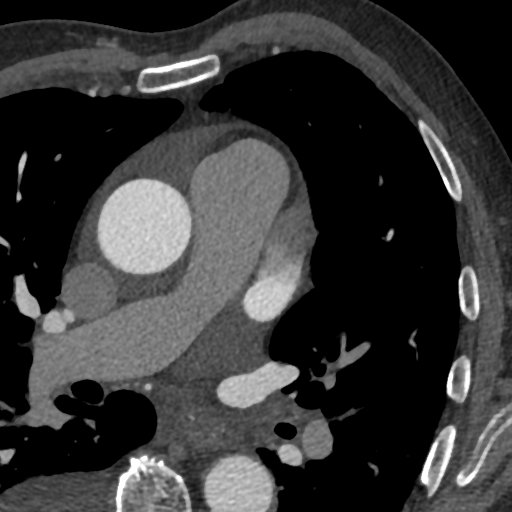
[im 384/432  vessel]
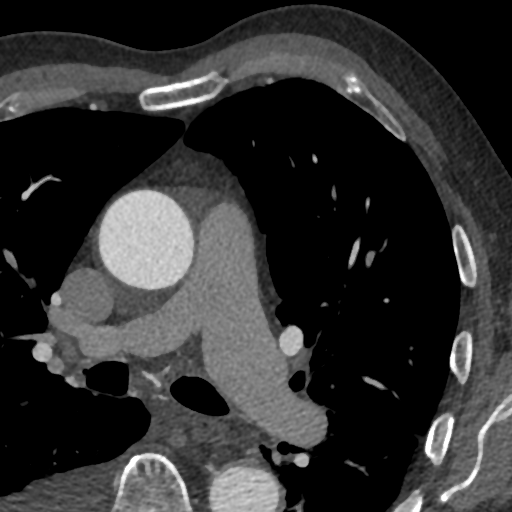

[Series 14: best diast 320 ms · axial · 0.44mm/px · z∈[+1289,+1331]mm · 3 of 209 slices shown]
[im 53/209  vessel]
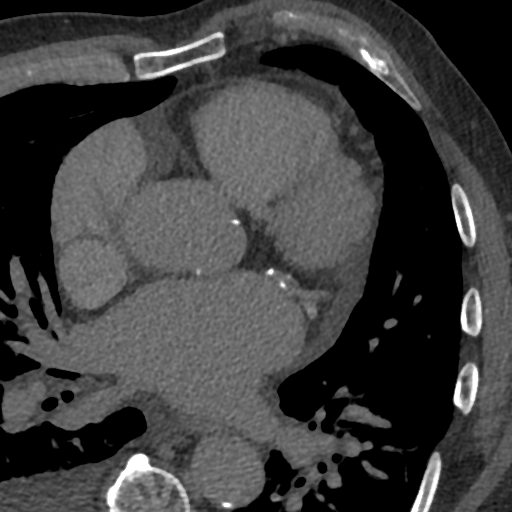
[im 105/209  vessel]
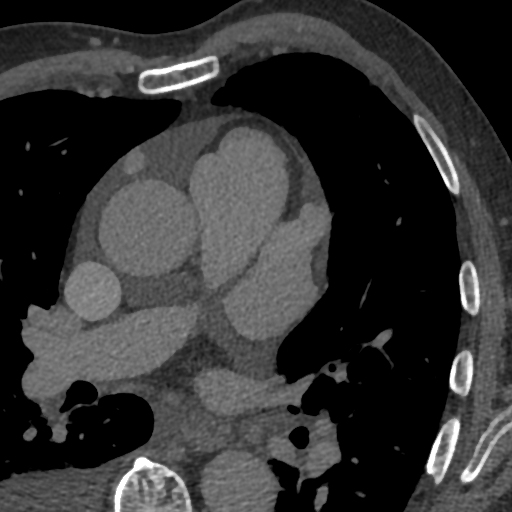
[im 157/209  vessel]
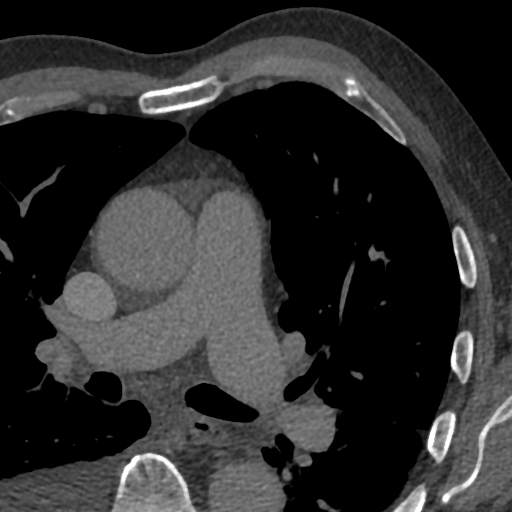

[11 of 20 positions shown; findings below may reference images not displayed]

FINDINGS: Non-cardiac: See separate report from [REDACTED]. No
significant findings on limited lung and soft tissue windows.

Calcium Score:  4368 marked in RCA and LAD

Coronary Arteries: Study not ideal to evaluate no nitro given and
patient in rapid afib. RCA is calcified but does not appear to have
obstructive disease nor does LM or LAD The distal LAD and circumflex
not well visualized

There was severe biatrial enlargement. There was no ASD/VSD or
pericardial effusion The aortic root was mildly dilated at 4.2 cm
The pulmonary veins drained normally into the LA. There were no
anomalies. The TIGER was enlarged. Initial contrast images suggested
thrombus in the mid and distal appendage. However delayed wash [DATE] seconds latter were totally normal The esophagus coursed
closest to the LLPV ostium

LUPV:  Ostium 12.5 mm Area 1.1 cm2

LLPV:  Ostium 17.5 mm Area 3 cm2

RUPV:  Ostium 15.4 mm Area 1.9 cm2

RLPV:  Ostium 20 mm Area 2.9 cm2
IMPRESSION: 1) Dilated TIGER no thrombus on delayed contrast images

2) Severe bi atrial enlargement

3) Normal pulmonary vein anatomy measurements as above

4) Mild aortic root dilatation 4.2 cm

5) Calcium score 4368 with dense calcium in LAD and RCA this is 97th
percentile for age and sex. Consider f/u perfusion study especially
given known decreased LV function

Bambucafe Tarla

EXAM:
OVER-READ INTERPRETATION  CT CHEST

The following report is an over-read performed by radiologist Dr.
over-read does not include interpretation of cardiac or coronary
anatomy or pathology. The coronary CTA interpretation by the
cardiologist is attached.
FINDINGS: Cardiovascular: Moderate to marked cardiomegaly with biatrial
enlargement. Small pericardial effusion. Ectasia of the ascending
thoracic aorta measuring up to approximately 4.2 cm diameter.
Atherosclerosis involving the descending thoracic aorta.

Mediastinum/Nodes: No pathologic lymphadenopathy involving the
visualized mediastinum, hila or lower axilla. Visualized esophagus
normal in appearance.

Lungs/Pleura: Moderately large right pleural effusion extending into
the major fissure on the right. Associated passive atelectasis
involving the right lower lobe. Atelectasis involving the left lower
lobe. Remaining visualized pulmonary parenchyma clear. Central
airways patent with moderate bronchial wall thickening.

Upper Abdomen: Visualized extreme upper abdomen unremarkable for the
arterial phase of enhancement.

Musculoskeletal: Degenerative disc disease and spondylosis
throughout the visualized thoracic spine. No acute abnormalities.
IMPRESSION: 1. Moderately large right pleural effusion with associated passive
atelectasis involving the right lower lobe. Mild atelectasis
involving the left lower lobe.
2. Central bronchial wall thickening indicating asthma and/or
bronchitis.
3. Moderate to marked cardiomegaly with biatrial enlargement. Small
pericardial effusion.
4. Ectatic ascending thoracic aorta measuring up to approximately
4.2 cm diameter. Recommend annual imaging followup by CTA or MRA.
This recommendation follows 3565
ACCF/AHA/AATS/ACR/ASA/SCA/ANELY/NEO/YINI/APALOO Guidelines for the
Diagnosis and Management of Patients with Thoracic Aortic Disease.
Circulation. 3565; 121: [id number].
5.  Aortic Atherosclerosis ([id number]-170.0)

## 2018-09-22 ENCOUNTER — Other Ambulatory Visit: Payer: Self-pay

## 2018-09-22 DIAGNOSIS — C9210 Chronic myeloid leukemia, BCR/ABL-positive, not having achieved remission: Secondary | ICD-10-CM

## 2018-09-25 ENCOUNTER — Other Ambulatory Visit: Payer: Self-pay

## 2018-09-25 ENCOUNTER — Inpatient Hospital Stay: Payer: BC Managed Care – PPO | Attending: Hematology and Oncology

## 2018-09-25 DIAGNOSIS — C9210 Chronic myeloid leukemia, BCR/ABL-positive, not having achieved remission: Secondary | ICD-10-CM | POA: Diagnosis not present

## 2018-09-25 DIAGNOSIS — I10 Essential (primary) hypertension: Secondary | ICD-10-CM | POA: Diagnosis not present

## 2018-09-25 DIAGNOSIS — D701 Agranulocytosis secondary to cancer chemotherapy: Secondary | ICD-10-CM | POA: Diagnosis not present

## 2018-09-25 LAB — CBC WITH DIFFERENTIAL (CANCER CENTER ONLY)
Abs Immature Granulocytes: 0.01 10*3/uL (ref 0.00–0.07)
Basophils Absolute: 0 10*3/uL (ref 0.0–0.1)
Basophils Relative: 1 %
Eosinophils Absolute: 0.1 10*3/uL (ref 0.0–0.5)
Eosinophils Relative: 4 %
HCT: 40.4 % (ref 39.0–52.0)
Hemoglobin: 13.4 g/dL (ref 13.0–17.0)
Immature Granulocytes: 0 %
Lymphocytes Relative: 29 %
Lymphs Abs: 0.9 10*3/uL (ref 0.7–4.0)
MCH: 30.7 pg (ref 26.0–34.0)
MCHC: 33.2 g/dL (ref 30.0–36.0)
MCV: 92.7 fL (ref 80.0–100.0)
Monocytes Absolute: 0.4 10*3/uL (ref 0.1–1.0)
Monocytes Relative: 14 %
Neutro Abs: 1.6 10*3/uL — ABNORMAL LOW (ref 1.7–7.7)
Neutrophils Relative %: 52 %
Platelet Count: 158 10*3/uL (ref 150–400)
RBC: 4.36 MIL/uL (ref 4.22–5.81)
RDW: 13.4 % (ref 11.5–15.5)
WBC Count: 3 10*3/uL — ABNORMAL LOW (ref 4.0–10.5)
nRBC: 0 % (ref 0.0–0.2)

## 2018-09-25 LAB — CMP (CANCER CENTER ONLY)
ALT: 16 U/L (ref 0–44)
AST: 19 U/L (ref 15–41)
Albumin: 3.9 g/dL (ref 3.5–5.0)
Alkaline Phosphatase: 69 U/L (ref 38–126)
Anion gap: 9 (ref 5–15)
BUN: 16 mg/dL (ref 8–23)
CO2: 24 mmol/L (ref 22–32)
Calcium: 8.8 mg/dL — ABNORMAL LOW (ref 8.9–10.3)
Chloride: 106 mmol/L (ref 98–111)
Creatinine: 1 mg/dL (ref 0.61–1.24)
GFR, Est AFR Am: >60 mL/min (ref >60)
GFR, Estimated: >60 mL/min (ref >60)
Glucose, Bld: 103 mg/dL — ABNORMAL HIGH (ref 70–99)
Potassium: 4.3 mmol/L (ref 3.5–5.1)
Sodium: 139 mmol/L (ref 135–145)
Total Bilirubin: 0.7 mg/dL (ref 0.3–1.2)
Total Protein: 7.1 g/dL (ref 6.5–8.1)

## 2018-10-02 ENCOUNTER — Encounter: Payer: Self-pay | Admitting: Hematology and Oncology

## 2018-10-02 ENCOUNTER — Inpatient Hospital Stay: Payer: BC Managed Care – PPO

## 2018-10-02 ENCOUNTER — Other Ambulatory Visit: Payer: Self-pay | Admitting: Hematology and Oncology

## 2018-10-02 ENCOUNTER — Inpatient Hospital Stay: Payer: BC Managed Care – PPO | Admitting: Hematology and Oncology

## 2018-10-02 ENCOUNTER — Other Ambulatory Visit: Payer: Self-pay

## 2018-10-02 VITALS — BP 159/57 | HR 56 | Temp 98.5°F | Resp 17 | Ht 74.0 in | Wt 208.8 lb

## 2018-10-02 DIAGNOSIS — C9210 Chronic myeloid leukemia, BCR/ABL-positive, not having achieved remission: Secondary | ICD-10-CM | POA: Diagnosis not present

## 2018-10-02 DIAGNOSIS — D701 Agranulocytosis secondary to cancer chemotherapy: Secondary | ICD-10-CM

## 2018-10-02 DIAGNOSIS — T451X5A Adverse effect of antineoplastic and immunosuppressive drugs, initial encounter: Secondary | ICD-10-CM

## 2018-10-02 DIAGNOSIS — I10 Essential (primary) hypertension: Secondary | ICD-10-CM

## 2018-10-02 NOTE — Assessment & Plan Note (Signed)
He is not symptomatic with mild pancytopenia due to his treatment We will continue same dose without dose adjustment

## 2018-10-02 NOTE — Assessment & Plan Note (Signed)
Due to recent delay of his appointment, we did not have BCR/ABL result I will get it scheduled and will call him with test results next week He will continue on reduced dose Sprycel at 50 mg daily by mouth I would not recommend further dose adjustment or stopping his treatment because of documented resistance to [place].  He does not fulfill the criteria for discontinuation of TKI based on current guidelines I will see him back in 6 months He is educated to watch out for signs and symptoms of side effects of treatment such as fluid retention, shortness of breath or infection

## 2018-10-02 NOTE — Assessment & Plan Note (Signed)
The patient is noted to have chronically elevated blood pressure His blood pressure at home is also elevated I recommend close monitoring of blood pressure and to consult with his primary care doctor or cardiologist for medication adjustment due to his history of heart failure

## 2018-10-02 NOTE — Progress Notes (Signed)
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  He does not fulfill the criteria for discontinuation of TKI based on current guidelines I will see him back in 6 months He is educated to watch out for signs and symptoms of side effects of treatment such as fluid retention, shortness of breath or infection  White coat syndrome with hypertension The patient is noted to have chronically elevated blood pressure His blood pressure at home is also elevated I recommend close monitoring of blood pressure and to consult with his primary care doctor or cardiologist for medication adjustment due to his history of heart failure  Leukopenia due to antineoplastic chemotherapy He is not symptomatic with mild pancytopenia due to his treatment We will continue same dose without dose adjustment   Orders Placed This Encounter  Procedures  . BCR-ABL    With RT-PCR technique    Standing Status:   Standing    Number of Occurrences:   9    Standing Expiration Date:   10/02/2019    INTERVAL HISTORY: Please see below for problem oriented charting. 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   11/07/2012 - 08/09/2013 Chemotherapy   He was started on Gleevec 400 mg daily.   08/09/2013 Progression   Gleevec was discontinued due to a rising white blood cell count and positive ABL Kinase mutation study, 100% positive to E255V mutation   08/09/2013 - 08/21/2013 Chemotherapy   His therapy is switched to hydroxyurea 1000 mg daily pending approval for Dasatinib.   08/22/2013 -  Chemotherapy   He is started on dasatinib.   11/19/2013 Tumor Marker   BCR/ABL is improving   02/22/2014 Pathology Results   BCR/ABL continues to improve and the patient is moving towards molecular remission   05/27/2014 Pathology Results   BCR/ABL b2a2 & b3a2 both are 0.05%, b2a2 & b3a2 IS both are 0.028%. Patient in MMR   09/26/2014 Pathology Results   Repeat BCR/ABL was not detectable   02/28/2015 Tumor Marker   Repeat BCR/ABL was not detectable   08/28/2015 Tumor Marker   Repeat BCR/ABL was not detectable   01/06/2016 Pathology Results   Repeat BCR/ABL was not detectable   04/15/2016 Pathology Results   Repeat BCR/ABL was not detectable   07/22/2016 Pathology Results   Repeat BCR/ABL was not detectable   11/29/2016 Pathology Results   Repeat BCR/ABL was not detectable   05/30/2017 Pathology Results   Repeat BCR/ABL was not detectable   11/29/2017 Pathology Results  Repeat BCR/ABL was not detectable     REVIEW OF SYSTEMS:   Constitutional: Denies fevers, chills or abnormal weight loss Eyes: Denies blurriness of vision Ears, nose, mouth, throat, and face: Denies mucositis or sore throat Respiratory: Denies cough, dyspnea or wheezes Cardiovascular: Denies palpitation, chest discomfort  Gastrointestinal:  Denies nausea, heartburn or change in bowel habits Skin: Denies abnormal skin  rashes Lymphatics: Denies new lymphadenopathy or easy bruising Neurological:Denies numbness, tingling or new weaknesses Behavioral/Psych: Mood is stable, no new changes  All other systems were reviewed with the patient and are negative.  I have reviewed the past medical history, past surgical history, social history and family history with the patient and they are unchanged from previous note.  ALLERGIES:  is allergic to corticosteroids.  MEDICATIONS:  Current Outpatient Medications  Medication Sig Dispense Refill  . acetaminophen (TYLENOL) 500 MG tablet Take 1,000 mg by mouth every 6 (six) hours as needed for headache (pain).    . APPLE CIDER VINEGAR PO Take 15 mLs by mouth every other day.    . Cholecalciferol (VITAMIN D PO) Take 5,000 Units by mouth daily.    . Coenzyme Q10 (CO Q-10) 100 MG CAPS Take 100 mg by mouth daily.    . Digestive Enzymes CAPS Take 1 capsule by mouth daily with lunch.     Marland Kitchen ELIQUIS 5 MG TABS tablet TAKE 1 TABLET BY MOUTH TWICE A DAY 60 tablet 9  . losartan (COZAAR) 25 MG tablet TAKE 1 TABLET BY MOUTH EVERY DAY 90 tablet 3  . magnesium oxide (MAG-OX) 400 MG tablet Take 400 mg by mouth at bedtime.     . metoprolol (TOPROL-XL) 200 MG 24 hr tablet Take 1 tablet (200 mg total) by mouth daily. Take with or immediately following a meal. Please make appt with Dr. Marlou Porch. 1st attempt 90 tablet 1  . MILK THISTLE PO Take 1 capsule by mouth 4 (four) times a week.     . Multiple Vitamin (MULTIVITAMIN WITH MINERALS) TABS tablet Take 1 tablet by mouth daily.    Marland Kitchen OVER THE COUNTER MEDICATION Take 1 oz by mouth See admin instructions. ASEA dietary supplement: Drink 1 ounce by mouth up to twice daily.    . SPRYCEL 50 MG tablet TAKE 1 TABLET BY MOUTH ONCE DAILY AT THE SAME TIME. MAY TAKE WITH OR WITHOUT FOOD. SWALLOW WHOLE. AVOID GRAPEFRUIT PRODUCTS. 90 tablet 4  . TURMERIC CURCUMIN PO Take 2-3 capsules by mouth daily.      No current facility-administered medications for this  visit.     PHYSICAL EXAMINATION: ECOG PERFORMANCE STATUS: 1 - Symptomatic but completely ambulatory  Vitals:   10/02/18 0859  BP: (!) 159/57  Pulse: (!) 56  Resp: 17  Temp: 98.5 F (36.9 C)  SpO2: 99%   Filed Weights   10/02/18 0859  Weight: 208 lb 12.8 oz (94.7 kg)    GENERAL:alert, no distress and comfortable SKIN: skin color, texture, turgor are normal, no rashes or significant lesions EYES: normal, Conjunctiva are pink and non-injected, sclera clear OROPHARYNX:no exudate, no erythema and lips, buccal mucosa, and tongue normal  NECK: supple, thyroid normal size, non-tender, without nodularity LYMPH:  no palpable lymphadenopathy in the cervical, axillary or inguinal LUNGS: clear to auscultation and percussion with normal breathing effort HEART: regular rate & rhythm and no murmurs with mild bilateral lower extremity edema ABDOMEN:abdomen soft, non-tender and normal bowel sounds Musculoskeletal:no cyanosis of digits and no clubbing  NEURO: alert & oriented x 3 with fluent speech,  no focal motor/sensory deficits  LABORATORY DATA:  I have reviewed the data as listed    Component Value Date/Time   NA 139 09/25/2018 0813   NA 139 11/29/2016 0829   K 4.3 09/25/2018 0813   K 4.3 11/29/2016 0829   CL 106 09/25/2018 0813   CO2 24 09/25/2018 0813   CO2 24 11/29/2016 0829   GLUCOSE 103 (H) 09/25/2018 0813   GLUCOSE 101 11/29/2016 0829   BUN 16 09/25/2018 0813   BUN 17.0 11/29/2016 0829   CREATININE 1.00 09/25/2018 0813   CREATININE 0.9 11/29/2016 0829   CALCIUM 8.8 (L) 09/25/2018 0813   CALCIUM 9.1 11/29/2016 0829   PROT 7.1 09/25/2018 0813   PROT 7.0 11/29/2016 0829   ALBUMIN 3.9 09/25/2018 0813   ALBUMIN 3.8 11/29/2016 0829   AST 19 09/25/2018 0813   AST 17 11/29/2016 0829   ALT 16 09/25/2018 0813   ALT 16 11/29/2016 0829   ALKPHOS 69 09/25/2018 0813   ALKPHOS 63 11/29/2016 0829   BILITOT 0.7 09/25/2018 0813   BILITOT 0.83 11/29/2016 0829   GFRNONAA >60  09/25/2018 0813   GFRAA >60 09/25/2018 0813    No results found for: SPEP, UPEP  Lab Results  Component Value Date   WBC 3.0 (L) 09/25/2018   NEUTROABS 1.6 (L) 09/25/2018   HGB 13.4 09/25/2018   HCT 40.4 09/25/2018   MCV 92.7 09/25/2018   PLT 158 09/25/2018      Chemistry      Component Value Date/Time   NA 139 09/25/2018 0813   NA 139 11/29/2016 0829   K 4.3 09/25/2018 0813   K 4.3 11/29/2016 0829   CL 106 09/25/2018 0813   CO2 24 09/25/2018 0813   CO2 24 11/29/2016 0829   BUN 16 09/25/2018 0813   BUN 17.0 11/29/2016 0829   CREATININE 1.00 09/25/2018 0813   CREATININE 0.9 11/29/2016 0829      Component Value Date/Time   CALCIUM 8.8 (L) 09/25/2018 0813   CALCIUM 9.1 11/29/2016 0829   ALKPHOS 69 09/25/2018 0813   ALKPHOS 63 11/29/2016 0829   AST 19 09/25/2018 0813   AST 17 11/29/2016 0829   ALT 16 09/25/2018 0813   ALT 16 11/29/2016 0829   BILITOT 0.7 09/25/2018 0813   BILITOT 0.83 11/29/2016 0829       All questions were answered. The patient knows to call the clinic with any problems, questions or concerns. No barriers to learning was detected.  I spent 10 minutes counseling the patient face to face. The total time spent in the appointment was 15 minutes and more than 50% was on counseling and review of test results  Heath Lark, MD 10/02/2018 10:50 AM

## 2018-10-09 ENCOUNTER — Telehealth: Payer: Self-pay

## 2018-10-09 NOTE — Telephone Encounter (Signed)
Called pt and gave below msg

## 2018-10-09 NOTE — Telephone Encounter (Signed)
-----   Message from Heath Lark, MD sent at 10/09/2018  7:50 AM EDT ----- Regarding: BCR/ABL not detected, pls let him know

## 2018-10-17 LAB — BCR/ABL

## 2018-11-01 ENCOUNTER — Telehealth: Payer: Self-pay | Admitting: Hematology and Oncology

## 2018-11-01 NOTE — Telephone Encounter (Signed)
I talk with patient regarding schedule  

## 2018-12-03 ENCOUNTER — Other Ambulatory Visit: Payer: Self-pay | Admitting: Cardiology

## 2018-12-06 ENCOUNTER — Ambulatory Visit: Payer: BC Managed Care – PPO | Admitting: Cardiology

## 2018-12-19 ENCOUNTER — Other Ambulatory Visit: Payer: Self-pay

## 2018-12-19 ENCOUNTER — Ambulatory Visit: Payer: BC Managed Care – PPO | Admitting: Cardiology

## 2018-12-19 ENCOUNTER — Encounter: Payer: Self-pay | Admitting: Cardiology

## 2018-12-19 VITALS — BP 164/108 | HR 79 | Ht 74.0 in | Wt 213.6 lb

## 2018-12-19 DIAGNOSIS — I10 Essential (primary) hypertension: Secondary | ICD-10-CM | POA: Diagnosis not present

## 2018-12-19 DIAGNOSIS — I4819 Other persistent atrial fibrillation: Secondary | ICD-10-CM

## 2018-12-19 DIAGNOSIS — Z01810 Encounter for preprocedural cardiovascular examination: Secondary | ICD-10-CM | POA: Diagnosis not present

## 2018-12-19 MED ORDER — METOPROLOL SUCCINATE ER 200 MG PO TB24: 200.0000 mg | ORAL_TABLET | Freq: Every day | ORAL | 3 refills | Status: DC

## 2018-12-19 MED ORDER — APIXABAN 5 MG PO TABS: 5.0000 mg | ORAL_TABLET | Freq: Two times a day (BID) | ORAL | 3 refills | Status: DC

## 2018-12-19 NOTE — Progress Notes (Signed)
Cardiology Office Note:    Date:  12/19/2018   ID:  Larry Vasquez, DOB [birth year], MRN [id number]  PCP:  [name], MD  Cardiologist:  Constance Haw, MD   Referring MD: [name], MD     History of Present Illness:    Larry Vasquez is a 68 y.o. male with persistent atrial fibrillation, Dr. Curt Bears, prior admission 06/04/2016, CML, EF previously 35 to 40% thought to be tachycardia mediated since repeat echo shows EF of 50 to 55%, cardioversion 06/24/2016, ablation on 10/08/2016, right groin hematoma postop.  No further atrial fibrillation.   He is here today for atrial fibrillation follow-up.  He has not had any recent incidences.  Golf. Overall he has not been feeling any chest pain fevers chills nausea vomiting syncope bleeding anginal symptoms.  Doing very well.  Blood pressure, whitecoat hypertension once again noted.  He has been out of the Toprol for about a week.    Past Medical History:  Diagnosis Date  . Cataract    Left eye  . CML (chronic myelocytic leukemia) ([place])   . Leukocytosis, unspecified 10/17/2012  . Thrombocytosis ([place]) 10/17/2012  . White coat hypertension 06/06/2014    Past Surgical History:  Procedure Laterality Date  . ATRIAL FIBRILLATION ABLATION [name] 10/08/2016   Procedure: Atrial Fibrillation Ablation;  Surgeon: Constance Haw, MD;  Location: [facility];  Service: Cardiovascular;  Laterality: [name];  . CARDIOVERSION [name] 06/24/2016   Procedure: CARDIOVERSION;  Surgeon: [name], MD;  Location: [facility] ENDOSCOPY;  Service: Cardiovascular;  Laterality: [name];  . CATARACT EXTRACTION Left 01/01/2013  . EYE SURGERY  2012   Detached Retina  . HERNIA REPAIR    . JOINT REPLACEMENT  2006   Total Knee Left  . TONSILLECTOMY      Current Medications: Current Meds  Medication Sig  . acetaminophen (TYLENOL) 500 MG tablet Take 1,000 mg by mouth every 6 (six) hours as needed for headache (pain).  Marland Kitchen apixaban (ELIQUIS) 5 MG TABS tablet  Take 1 tablet (5 mg total) by mouth 2 (two) times daily.  . APPLE CIDER VINEGAR PO Take 15 mLs by mouth every other day.  . Cholecalciferol (VITAMIN D PO) Take 5,000 Units by mouth daily.  . Coenzyme Q10 (CO Q-10) 100 MG CAPS Take 100 mg by mouth daily.  . Digestive Enzymes CAPS Take 1 capsule by mouth daily with lunch.   . losartan (COZAAR) 25 MG tablet TAKE 1 TABLET BY MOUTH EVERY DAY  . magnesium oxide (MAG-OX) 400 MG tablet Take 400 mg by mouth at bedtime.   . metoprolol (TOPROL-XL) 200 MG 24 hr tablet Take 1 tablet (200 mg total) by mouth daily.  Marland Kitchen MILK THISTLE PO Take 1 capsule by mouth 4 (four) times a week.   . Multiple Vitamin (MULTIVITAMIN WITH MINERALS) TABS tablet Take 1 tablet by mouth daily.  Marland Kitchen OVER THE COUNTER MEDICATION Take 1 oz by mouth See admin instructions. ASEA dietary supplement: Drink 1 ounce by mouth up to twice daily.  . SPRYCEL 50 MG tablet TAKE 1 TABLET BY MOUTH ONCE DAILY AT THE SAME TIME. MAY TAKE WITH OR WITHOUT FOOD. SWALLOW WHOLE. AVOID GRAPEFRUIT PRODUCTS.  Marland Kitchen TURMERIC CURCUMIN PO Take 2-3 capsules by mouth daily.   . [DISCONTINUED] ELIQUIS 5 MG TABS tablet TAKE 1 TABLET BY MOUTH TWICE A DAY  . [DISCONTINUED] metoprolol (TOPROL-XL) 200 MG 24 hr tablet TAKE 1 TABLET BY MOUTH DAILY. TAKE WITH OR IMMEDIATELY FOLLOWING A MEAL.  Allergies:   Corticosteroids   Social History   Socioeconomic History  . Marital status: Married    Spouse name: Not on file  . Number of children: Not on file  . Years of education: Not on file  . Highest education level: Not on file  Occupational History  . Not on file  Social Needs  . Financial resource strain: Not on file  . Food insecurity    Worry: Not on file    Inability: Not on file  . Transportation needs    Medical: Not on file    Non-medical: Not on file  Tobacco Use  . Smoking status: Never Smoker  . Smokeless tobacco: Never Used  Substance and Sexual Activity  . Alcohol use: Yes    Alcohol/week: 7.0  standard drinks    Types: 7 Glasses of wine per week  . Drug use: No  . Sexual activity: Yes    Birth control/protection: None  Lifestyle  . Physical activity    Days per week: Not on file    Minutes per session: Not on file  . Stress: Not on file  Relationships  . Social Herbalist on phone: Not on file    Gets together: Not on file    Attends religious service: Not on file    Active member of club or organization: Not on file    Attends meetings of clubs or organizations: Not on file    Relationship status: Not on file  Other Topics Concern  . Not on file  Social History Narrative  . Not on file     Family History: The patient's family history includes Arthritis in his mother; Atrial fibrillation in his father; CAD in his father; Cancer in his father; Chronic Renal Failure in his father; Diabetes in his father; Healthy in his brother and brother; Hypertension in his father and mother.  ROS:   Please see the history of present illness.     All other systems reviewed and are negative.  EKGs/Labs/Other Studies Reviewed:    The following studies were reviewed today: Prior echocardiogram, EKG, lab work, office notes reviewed  EKG: 12/19/2018-sinus rhythm 79 with occasional PVC no other abnormalities.  Recent Labs: 09/25/2018: ALT 16; BUN 16; Creatinine 1.00; Hemoglobin 13.4; Platelet Count 158; Potassium 4.3; Sodium 139  Recent Lipid Panel No results found for: CHOL, TRIG, HDL, CHOLHDL, VLDL, LDLCALC, LDLDIRECT  Physical Exam:    VS:  BP (!) 164/108   Pulse 79   Ht 6\' 2"  (1.88 m)   Wt 213 lb 9.6 oz (96.9 kg)   SpO2 97%   BMI 27.42 kg/m     Wt Readings from Last 3 Encounters:  12/19/18 213 lb 9.6 oz (96.9 kg)  10/02/18 208 lb 12.8 oz (94.7 kg)  12/06/17 214 lb 6.4 oz (97.3 kg)     GEN: Well nourished, well developed, in no acute distress  HEENT: normal  Neck: no JVD, carotid bruits, or masses Cardiac: Rare ectopy, RRR; no murmurs, rubs, or gallops,no  edema  Respiratory:  clear to auscultation bilaterally, normal work of breathing GI: soft, nontender, nondistended, + BS MS: no deformity or atrophy  Skin: warm and dry, no rash Neuro:  Alert and Oriented x 3, Strength and sensation are intact Psych: euthymic mood, full affect   ASSESSMENT:    1. Preop cardiovascular exam   2. Persistent atrial fibrillation ([place])   3. Essential hypertension    PLAN:    In order of  problems listed above:  Paroxysmal atrial fibrillation status post ablation 2018 with chronic anticoagulation -Doing very well, no recurrence.  For now, let us continue with Eliquis in case atrial for ablation were to return.  Certainly, in a future date this can be a to a discussion on whether or not cessation should occur.  Of course if it were to return and he were not on Eliquis, this could increase his risk for embolic stroke.  No changes made.  I like to keep things going as prescribed.  Tachycardia mediated cardiomyopathy -Resolved, ablation - On losartan 25 and Toprol XL 200 mg daily.  Continue.  Seems a doing very well with this regimen.  Essential hypertension - Usually a little bit high when going to the doctor.  Let us go ahead and have him purchase an Omron blood pressure cuff.  1 year follow up   Medication Adjustments/Labs and Tests Ordered: Current medicines are reviewed at length with the patient today.  Concerns regarding medicines are outlined above.  Orders Placed This Encounter  Procedures  . EKG 12-Lead   Meds ordered this encounter  Medications  . metoprolol (TOPROL-XL) 200 MG 24 hr tablet    Sig: Take 1 tablet (200 mg total) by mouth daily.    Dispense:  90 tablet    Refill:  3  . apixaban (ELIQUIS) 5 MG TABS tablet    Sig: Take 1 tablet (5 mg total) by mouth 2 (two) times daily.    Dispense:  180 tablet    Refill:  3    Patient Instructions  Medication Instructions:   Your physician recommends that you continue on your current  medications as directed. Please refer to the Current Medication list given to you today.  If you need a refill on your cardiac medications before your next appointment, please call your pharmacy.      Follow-Up: At [facility], you and your health needs are our priority.  As part of our continuing mission to provide you with exceptional heart care, we have created designated Provider Care Teams.  These Care Teams include your primary Cardiologist (physician) and Advanced Practice Providers (APPs -  Physician Assistants and Nurse Practitioners) who all work together to provide you with the care you need, when you need it. You will need a follow up appointment in 12 months with Dr. Marlou Porch.  Please call our office 2 months in advance to schedule this appointment.  You may see Dr. Marlou Porch or one of the following Advanced Practice Providers on your designated Care Team:   Truitt Merle, NP Cecilie Kicks, NP . Kathyrn Drown, NP    Any Other Special Instructions Will Be Listed Below (If Applicable). DR. Marlou Porch SUGGESTED THAT YOU PURCHASE AN OMRON BLOOD PRESSURE CUFF TO USE.       Signed, Candee Furbish, MD  12/19/2018 9:59 AM    [facility]

## 2018-12-19 NOTE — Patient Instructions (Signed)
Medication Instructions:   Your physician recommends that you continue on your current medications as directed. Please refer to the Current Medication list given to you today.  If you need a refill on your cardiac medications before your next appointment, please call your pharmacy.      Follow-Up: At [facility], you and your health needs are our priority.  As part of our continuing mission to provide you with exceptional heart care, we have created designated Provider Care Teams.  These Care Teams include your primary Cardiologist (physician) and Advanced Practice Providers (APPs -  Physician Assistants and Nurse Practitioners) who all work together to provide you with the care you need, when you need it. You will need a follow up appointment in 12 months with Dr. Marlou Porch.  Please call our office 2 months in advance to schedule this appointment.  You may see Dr. Marlou Porch or one of the following Advanced Practice Providers on your designated Care Team:   Truitt Merle, NP Cecilie Kicks, NP . Kathyrn Drown, NP    Any Other Special Instructions Will Be Listed Below (If Applicable). DR. Marlou Porch SUGGESTED THAT YOU PURCHASE AN OMRON BLOOD PRESSURE CUFF TO USE.

## 2019-03-23 ENCOUNTER — Other Ambulatory Visit: Payer: Self-pay | Admitting: Hematology and Oncology

## 2019-03-23 ENCOUNTER — Inpatient Hospital Stay: Payer: 59 | Attending: Hematology and Oncology

## 2019-03-23 ENCOUNTER — Other Ambulatory Visit: Payer: Self-pay

## 2019-03-23 DIAGNOSIS — C9211 Chronic myeloid leukemia, BCR/ABL-positive, in remission: Secondary | ICD-10-CM | POA: Insufficient documentation

## 2019-03-23 DIAGNOSIS — I509 Heart failure, unspecified: Secondary | ICD-10-CM | POA: Diagnosis not present

## 2019-03-23 DIAGNOSIS — C9210 Chronic myeloid leukemia, BCR/ABL-positive, not having achieved remission: Secondary | ICD-10-CM

## 2019-03-23 DIAGNOSIS — I110 Hypertensive heart disease with heart failure: Secondary | ICD-10-CM | POA: Diagnosis not present

## 2019-03-23 LAB — CBC WITH DIFFERENTIAL/PLATELET
Abs Immature Granulocytes: 0 10*3/uL (ref 0.00–0.07)
Basophils Absolute: 0 10*3/uL (ref 0.0–0.1)
Basophils Relative: 1 %
Eosinophils Absolute: 0.1 10*3/uL (ref 0.0–0.5)
Eosinophils Relative: 2 %
HCT: 39.3 % (ref 39.0–52.0)
Hemoglobin: 13.1 g/dL (ref 13.0–17.0)
Immature Granulocytes: 0 %
Lymphocytes Relative: 25 %
Lymphs Abs: 1.1 10*3/uL (ref 0.7–4.0)
MCH: 31.3 pg (ref 26.0–34.0)
MCHC: 33.3 g/dL (ref 30.0–36.0)
MCV: 94 fL (ref 80.0–100.0)
Monocytes Absolute: 0.6 10*3/uL (ref 0.1–1.0)
Monocytes Relative: 14 %
Neutro Abs: 2.5 10*3/uL (ref 1.7–7.7)
Neutrophils Relative %: 58 %
Platelets: 155 10*3/uL (ref 150–400)
RBC: 4.18 MIL/uL — ABNORMAL LOW (ref 4.22–5.81)
RDW: 13.9 % (ref 11.5–15.5)
WBC: 4.3 10*3/uL (ref 4.0–10.5)
nRBC: 0 % (ref 0.0–0.2)

## 2019-03-29 ENCOUNTER — Encounter: Payer: Self-pay | Admitting: Hematology and Oncology

## 2019-03-29 ENCOUNTER — Inpatient Hospital Stay: Payer: 59 | Admitting: Hematology and Oncology

## 2019-03-29 ENCOUNTER — Other Ambulatory Visit: Payer: Self-pay

## 2019-03-29 DIAGNOSIS — C9210 Chronic myeloid leukemia, BCR/ABL-positive, not having achieved remission: Secondary | ICD-10-CM | POA: Diagnosis not present

## 2019-03-29 DIAGNOSIS — C9211 Chronic myeloid leukemia, BCR/ABL-positive, in remission: Secondary | ICD-10-CM | POA: Diagnosis not present

## 2019-03-29 DIAGNOSIS — I10 Essential (primary) hypertension: Secondary | ICD-10-CM | POA: Diagnosis not present

## 2019-03-29 NOTE — Assessment & Plan Note (Signed)
The patient is noted to have chronically elevated blood pressure His blood pressure at home is also elevated I recommend close monitoring of blood pressure and to consult with his primary care doctor or cardiologist for medication adjustment due to his history of heart failure

## 2019-03-29 NOTE — Assessment & Plan Note (Addendum)
He continues to be in remission on reduced dose Sprycel He tolerated treatment very well I would not recommend further dose adjustment or stopping his treatment because of documented resistance to [place].  He does not fulfill the criteria for discontinuation of TKI based on current guidelines I will see him back in 6 months He is educated to watch out for signs and symptoms of side effects of treatment such as fluid retention, shortness of breath or infection  There is no contraindication for him to receive Covid vaccination

## 2019-03-29 NOTE — Progress Notes (Signed)
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  He does not fulfill the criteria for discontinuation of TKI based on current guidelines I will see him back in 6 months He is educated to watch out for signs and symptoms of side effects of treatment such as fluid retention, shortness of breath or infection  There is no contraindication for him to receive Covid vaccination  White coat syndrome with hypertension The patient is noted to have chronically elevated blood pressure His blood pressure at home is also elevated I recommend close monitoring of blood pressure and to consult with his primary care doctor or cardiologist for medication adjustment due to his history of heart failure   No orders of the defined types were placed in this encounter.   All questions were answered. The patient knows to call the clinic with any problems, questions or concerns. The total time spent in the appointment was 15 minutes encounter with patients including review of chart and various tests results, discussions about plan of care and coordination of care plan   Heath Lark, MD 03/29/2019 12:55 PM  INTERVAL HISTORY: Please see below for problem oriented charting. He returns for further follow-up He tolerated treatment very well No recent cough, chest pain or shortness of breath  He has very mild lower extremity edema that comes and goes He has questions related to Covid vaccination in the setting of chemotherapy  SUMMARY OF ONCOLOGIC HISTORY: Oncology History Overview Note  Physical   Chronic myelogenous  leukemia ([place])  11/01/2012 Bone Marrow Biopsy   Bone marrow aspirate and biopsy was performed for leukocytosis and thrombocytosis. 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   11/07/2012 - 08/09/2013 Chemotherapy   He was started on Gleevec 400 mg daily.   08/09/2013 Progression   Gleevec was discontinued due to a rising white blood cell count and positive ABL Kinase mutation study, 100% positive to E255V mutation   08/09/2013 - 08/21/2013 Chemotherapy   His therapy is switched to hydroxyurea 1000 mg daily pending approval for Dasatinib.   08/22/2013 -  Chemotherapy   He is started on dasatinib.   11/19/2013 Tumor Marker   BCR/ABL is improving   02/22/2014 Pathology Results   BCR/ABL continues to improve and the patient is moving towards molecular remission   05/27/2014 Pathology Results   BCR/ABL b2a2 & b3a2 both are 0.05%, b2a2 & b3a2 IS both are 0.028%. Patient in MMR   09/26/2014 Pathology Results   Repeat BCR/ABL was not detectable   02/28/2015 Tumor Marker   Repeat BCR/ABL was not detectable   08/28/2015 Tumor Marker   Repeat BCR/ABL was not detectable   01/06/2016 Pathology Results   Repeat BCR/ABL was not detectable   04/15/2016 Pathology Results   Repeat BCR/ABL was not detectable   07/22/2016 Pathology Results   Repeat BCR/ABL was not detectable   11/29/2016 Pathology Results   Repeat BCR/ABL was not detectable   05/30/2017 Pathology Results   Repeat BCR/ABL was not detectable   11/29/2017 Pathology Results   Repeat BCR/ABL was not detectable   10/02/2018 Pathology Results   BCR/ABL is not detectable.  03/23/2019 Pathology Results   BCR/ABL is not detectable     REVIEW OF SYSTEMS:   Constitutional: Denies fevers, chills or abnormal weight loss Eyes: Denies blurriness of vision Ears, nose, mouth, throat, and face: Denies mucositis or sore throat Respiratory: Denies cough, dyspnea or wheezes Cardiovascular: Denies palpitation, chest discomfort Gastrointestinal:   Denies nausea, heartburn or change in bowel habits Skin: Denies abnormal skin rashes Lymphatics: Denies new lymphadenopathy or easy bruising Neurological:Denies numbness, tingling or new weaknesses Behavioral/Psych: Mood is stable, no new changes  All other systems were reviewed with the patient and are negative.  I have reviewed the past medical history, past surgical history, social history and family history with the patient and they are unchanged from previous note.  ALLERGIES:  is allergic to corticosteroids.  MEDICATIONS:  Current Outpatient Medications  Medication Sig Dispense Refill  . acetaminophen (TYLENOL) 500 MG tablet Take 1,000 mg by mouth every 6 (six) hours as needed for headache (pain).    Marland Kitchen apixaban (ELIQUIS) 5 MG TABS tablet Take 1 tablet (5 mg total) by mouth 2 (two) times daily. 180 tablet 3  . APPLE CIDER VINEGAR PO Take 15 mLs by mouth every other day.    . Cholecalciferol (VITAMIN D PO) Take 5,000 Units by mouth daily.    . Coenzyme Q10 (CO Q-10) 100 MG CAPS Take 100 mg by mouth daily.    . Digestive Enzymes CAPS Take 1 capsule by mouth daily with lunch.     . losartan (COZAAR) 25 MG tablet TAKE 1 TABLET BY MOUTH EVERY DAY 90 tablet 3  . magnesium oxide (MAG-OX) 400 MG tablet Take 400 mg by mouth at bedtime.     . metoprolol (TOPROL-XL) 200 MG 24 hr tablet Take 1 tablet (200 mg total) by mouth daily. 90 tablet 3  . MILK THISTLE PO Take 1 capsule by mouth 4 (four) times a week.     . Multiple Vitamin (MULTIVITAMIN WITH MINERALS) TABS tablet Take 1 tablet by mouth daily.    Marland Kitchen OVER THE COUNTER MEDICATION Take 1 oz by mouth See admin instructions. ASEA dietary supplement: Drink 1 ounce by mouth up to twice daily.    . SPRYCEL 50 MG tablet TAKE 1 TABLET BY MOUTH ONCE DAILY AT THE SAME TIME. MAY TAKE WITH OR WITHOUT FOOD. SWALLOW WHOLE. AVOID GRAPEFRUIT PRODUCTS. 90 tablet 4  . TURMERIC CURCUMIN PO Take 2-3 capsules by mouth daily.      No current facility-administered  medications for this visit.    PHYSICAL EXAMINATION: ECOG PERFORMANCE STATUS: 1 - Symptomatic but completely ambulatory  Vitals:   03/29/19 1219  BP: (!) 176/59  Pulse: (!) 58  Resp: 18  Temp: 98.7 F (37.1 C)  SpO2: 100%   Filed Weights   03/29/19 1219  Weight: 218 lb 6.4 oz (99.1 kg)    GENERAL:alert, no distress and comfortable SKIN: skin color, texture, turgor are normal, no rashes or significant lesions EYES: normal, Conjunctiva are pink and non-injected, sclera clear OROPHARYNX:no exudate, no erythema and lips, buccal mucosa, and tongue normal  NECK: supple, thyroid normal size, non-tender, without nodularity LYMPH:  no palpable lymphadenopathy in the cervical, axillary or inguinal LUNGS: clear to auscultation and percussion with normal breathing effort HEART: regular rate & rhythm and no murmurs with mild bilateral lower extremity edema ABDOMEN:abdomen soft, non-tender and normal bowel sounds Musculoskeletal:no cyanosis of digits and no clubbing  NEURO: alert & oriented x 3 with fluent speech, no focal motor/sensory deficits  LABORATORY DATA:  I have reviewed the data as listed    Component Value Date/Time   NA 139 09/25/2018 0813   NA 139 11/29/2016 0829   K 4.3 09/25/2018 0813   K 4.3 11/29/2016 0829   CL 106 09/25/2018 0813   CO2 24 09/25/2018 0813   CO2 24 11/29/2016 0829   GLUCOSE 103 (H) 09/25/2018 0813   GLUCOSE 101 11/29/2016 0829   BUN 16 09/25/2018 0813   BUN 17.0 11/29/2016 0829   CREATININE 1.00 09/25/2018 0813   CREATININE 0.9 11/29/2016 0829   CALCIUM 8.8 (L) 09/25/2018 0813   CALCIUM 9.1 11/29/2016 0829   PROT 7.1 09/25/2018 0813   PROT 7.0 11/29/2016 0829   ALBUMIN 3.9 09/25/2018 0813   ALBUMIN 3.8 11/29/2016 0829   AST 19 09/25/2018 0813   AST 17 11/29/2016 0829   ALT 16 09/25/2018 0813   ALT 16 11/29/2016 0829   ALKPHOS 69 09/25/2018 0813   ALKPHOS 63 11/29/2016 0829   BILITOT 0.7 09/25/2018 0813   BILITOT 0.83 11/29/2016 0829    GFRNONAA >60 09/25/2018 0813   GFRAA >60 09/25/2018 0813    No results found for: SPEP, UPEP  Lab Results  Component Value Date   WBC 4.3 03/23/2019   NEUTROABS 2.5 03/23/2019   HGB 13.1 03/23/2019   HCT 39.3 03/23/2019   MCV 94.0 03/23/2019   PLT 155 03/23/2019      Chemistry      Component Value Date/Time   NA 139 09/25/2018 0813   NA 139 11/29/2016 0829   K 4.3 09/25/2018 0813   K 4.3 11/29/2016 0829   CL 106 09/25/2018 0813   CO2 24 09/25/2018 0813   CO2 24 11/29/2016 0829   BUN 16 09/25/2018 0813   BUN 17.0 11/29/2016 0829   CREATININE 1.00 09/25/2018 0813   CREATININE 0.9 11/29/2016 0829      Component Value Date/Time   CALCIUM 8.8 (L) 09/25/2018 0813   CALCIUM 9.1 11/29/2016 0829   ALKPHOS 69 09/25/2018 0813   ALKPHOS 63 11/29/2016 0829   AST 19 09/25/2018 0813   AST 17 11/29/2016 0829   ALT 16 09/25/2018 0813   ALT 16 11/29/2016 0829   BILITOT 0.7 09/25/2018 0813   BILITOT 0.83 11/29/2016 0829

## 2019-03-30 ENCOUNTER — Telehealth: Payer: Self-pay | Admitting: Hematology and Oncology

## 2019-03-30 LAB — BCR/ABL

## 2019-03-30 NOTE — Telephone Encounter (Signed)
Scheduled per 1/21 sch msg. Called and left msg. Mailing printout 

## 2019-04-02 ENCOUNTER — Ambulatory Visit: Payer: BC Managed Care – PPO | Admitting: Hematology and Oncology

## 2019-04-03 ENCOUNTER — Ambulatory Visit: Payer: 59 | Admitting: Hematology and Oncology

## 2019-04-09 ENCOUNTER — Telehealth: Payer: Self-pay | Admitting: Hematology and Oncology

## 2019-04-09 NOTE — Telephone Encounter (Signed)
Rescheduled per 1/29 sch msg, pt req. Called and spoke with pt, confirmed 7/9 appt

## 2019-05-02 ENCOUNTER — Other Ambulatory Visit: Payer: Self-pay | Admitting: Hematology and Oncology

## 2019-05-04 ENCOUNTER — Other Ambulatory Visit: Payer: Self-pay | Admitting: Hematology and Oncology

## 2019-05-05 ENCOUNTER — Emergency Department (HOSPITAL_COMMUNITY)
Admission: EM | Admit: 2019-05-05 | Discharge: 2019-05-05 | Disposition: A | Discharge status: Home / Self Care | Payer: 59 | Attending: Emergency Medicine | Admitting: Emergency Medicine

## 2019-05-05 ENCOUNTER — Encounter (HOSPITAL_COMMUNITY): Payer: Self-pay | Admitting: *Deleted

## 2019-05-05 ENCOUNTER — Other Ambulatory Visit: Payer: Self-pay

## 2019-05-05 DIAGNOSIS — R338 Other retention of urine: Secondary | ICD-10-CM

## 2019-05-05 DIAGNOSIS — Z96652 Presence of left artificial knee joint: Secondary | ICD-10-CM | POA: Insufficient documentation

## 2019-05-05 DIAGNOSIS — I10 Essential (primary) hypertension: Secondary | ICD-10-CM | POA: Diagnosis not present

## 2019-05-05 DIAGNOSIS — Z7901 Long term (current) use of anticoagulants: Secondary | ICD-10-CM | POA: Insufficient documentation

## 2019-05-05 DIAGNOSIS — K5900 Constipation, unspecified: Secondary | ICD-10-CM

## 2019-05-05 DIAGNOSIS — R300 Dysuria: Secondary | ICD-10-CM | POA: Diagnosis not present

## 2019-05-05 DIAGNOSIS — R339 Retention of urine, unspecified: Secondary | ICD-10-CM | POA: Insufficient documentation

## 2019-05-05 DIAGNOSIS — Z79899 Other long term (current) drug therapy: Secondary | ICD-10-CM | POA: Insufficient documentation

## 2019-05-05 LAB — URINALYSIS, ROUTINE W REFLEX MICROSCOPIC
Bacteria, UA: NONE SEEN
Bilirubin Urine: NEGATIVE
Glucose, UA: NEGATIVE mg/dL
Ketones, ur: NEGATIVE mg/dL
Leukocytes,Ua: NEGATIVE
Nitrite: NEGATIVE
Protein, ur: 30 mg/dL — AB
Specific Gravity, Urine: 1.013 (ref 1.005–1.030)
pH: 6 (ref 5.0–8.0)

## 2019-05-05 MED ORDER — DOCUSATE SODIUM 100 MG PO CAPS: 100.0000 mg | ORAL_CAPSULE | Freq: Two times a day (BID) | ORAL | 0 refills | Status: DC

## 2019-05-05 MED ORDER — TAMSULOSIN HCL 0.4 MG PO CAPS: 0.4000 mg | ORAL_CAPSULE | Freq: Every day | ORAL | 0 refills | Status: DC

## 2019-05-05 NOTE — ED Triage Notes (Signed)
Pt states he has not been able to urinate since noon yesterday. Also c/o constipation

## 2019-05-05 NOTE — ED Provider Notes (Signed)
[facility] DEPT Provider Note   CSN: [id number] Arrival date & time: 05/05/19  [id number]     History Chief Complaint  Patient presents with  . Dysuria    Barkley Yanak is a 69 y.o. male.  69 year old male with extensive past medical history below including BPH who presents with urinary retention.  Patient states that a few years ago he had problems with urinary retention and saw a urologist, never required Foley catheter and symptoms resolved after some medication.  He reports occasional difficulty initiating urinary stream but no severe problems and no issues with nocturia.  However, yesterday he began having difficulty with urination and has not been able to urinate since noon yesterday.  He denies any preceding dysuria or hematuria.  No nausea or vomiting and no abdominal pain prior to urinary retention.  He reports history of constipation for which he usually takes magnesium at night.  He took magnesium 2 nights ago and yesterday had 2 soft bowel movements in 1 slightly hard small bowel movement.  The history is provided by the patient.  Dysuria Presenting symptoms: dysuria        Past Medical History:  Diagnosis Date  . Cataract    Left eye  . CML (chronic myelocytic leukemia) ([place])   . Leukocytosis, unspecified 10/17/2012  . Thrombocytosis ([place]) 10/17/2012  . White coat hypertension 06/06/2014    Patient Active Problem List   Diagnosis Date Noted  . CML (chronic myelocytic leukemia) ([place])   . Cataract   . Hematoma 10/11/2016  . Status post catheter ablation of atrial flutter 10/09/2016  . Status post catheter ablation of atrial fibrillation 10/09/2016  . AF (atrial fibrillation) ([place]) 10/08/2016  . CHF (congestive heart failure) ([place]) 05/25/2016  . Atrial fibrillation, new onset ([place]) 05/25/2016  . Essential (primary) hypertension 05/25/2016  . Atrial fibrillation ([place]) 05/25/2016  . Atrial fibrillation with RVR ([place])   . Prostate cancer  screening 10/22/2015  . Routine health maintenance 10/22/2015  . BPH with elevated PSA 10/16/2015  . White coat syndrome with hypertension 06/06/2014  . Spells 11/28/2013  . Leukopenia due to antineoplastic chemotherapy ([place]) 11/28/2013  . Pancytopenia due to antineoplastic chemotherapy ([place]) 08/09/2013  . 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) 01/30/2013  . Thrombocytosis ([place]) 10/17/2012    Past Surgical History:  Procedure Laterality Date  . ATRIAL FIBRILLATION ABLATION [name] 10/08/2016   Procedure: Atrial Fibrillation Ablation;  Surgeon: Constance Haw, MD;  Location: [facility] LAB;  Service: Cardiovascular;  Laterality: [name];  . CARDIOVERSION [name] 06/24/2016   Procedure: CARDIOVERSION;  Surgeon: [name], MD;  Location: [facility] ENDOSCOPY;  Service: Cardiovascular;  Laterality: [name];  . CATARACT EXTRACTION Left 01/01/2013  . EYE SURGERY  2012   Detached Retina  . HERNIA REPAIR    . JOINT REPLACEMENT  2006   Total Knee Left  . TONSILLECTOMY         Family History  Problem Relation Age of Onset  . Arthritis Mother   . Hypertension Mother   . Diabetes Father   . Hypertension Father   . Cancer Father        bladder ca  . CAD Father        CABG  . Chronic Renal Failure Father   . Atrial fibrillation Father   . Healthy Brother   . Healthy Brother     Social History   Tobacco Use  . Smoking status: Never Smoker  . Smokeless tobacco: Never Used  Substance Use Topics  .  Alcohol use: Yes    Alcohol/week: 7.0 standard drinks    Types: 7 Glasses of wine per week  . Drug use: No    Home Medications Prior to Admission medications   Medication Sig Start Date End Date Taking? Authorizing Provider  acetaminophen (TYLENOL) 500 MG tablet Take 1,000 mg by mouth every 6 (six) hours as needed for headache (pain).    [provider]  apixaban (ELIQUIS) 5 MG TABS tablet Take 1 tablet (5 mg total) by mouth 2 (two) times daily. 12/19/18   [name], MD  APPLE CIDER  VINEGAR PO Take 15 mLs by mouth every other day.    [provider]  Cholecalciferol (VITAMIN D PO) Take 5,000 Units by mouth daily.    [provider]  Coenzyme Q10 (CO Q-10) 100 MG CAPS Take 100 mg by mouth daily.    [provider]  dasatinib (SPRYCEL) 50 MG tablet Take 1 tablet (50 mg total) by mouth daily. 05/04/19   Heath Lark, MD  Digestive Enzymes CAPS Take 1 capsule by mouth daily with lunch.     [provider]  losartan (COZAAR) 25 MG tablet TAKE 1 TABLET BY MOUTH EVERY DAY 05/15/18   Burnell Blanks, MD  magnesium oxide (MAG-OX) 400 MG tablet Take 400 mg by mouth at bedtime.     [provider]  metoprolol (TOPROL-XL) 200 MG 24 hr tablet Take 1 tablet (200 mg total) by mouth daily. 12/19/18   [name], MD  MILK THISTLE PO Take 1 capsule by mouth 4 (four) times a week.     [provider]  Multiple Vitamin (MULTIVITAMIN WITH MINERALS) TABS tablet Take 1 tablet by mouth daily.    [provider]  OVER THE COUNTER MEDICATION Take 1 oz by mouth See admin instructions. ASEA dietary supplement: Drink 1 ounce by mouth up to twice daily.    [provider]  TURMERIC CURCUMIN PO Take 2-3 capsules by mouth daily.     [provider]    Allergies    Corticosteroids  Review of Systems   Review of Systems  Genitourinary: Positive for dysuria.   All other systems reviewed and are negative except that which was mentioned in HPI  Physical Exam Updated Vital Signs BP (!) 211/124 (BP Location: Left Arm)   Temp 97.7 F (36.5 C)   Resp 17   Ht 6\' 2"  (1.88 m)   Wt 95.3 kg   SpO2 99%   BMI 26.96 kg/m   Physical Exam Vitals and nursing note reviewed.  Constitutional:      General: He is not in acute distress.    Appearance: He is well-developed.     Comments: uncomfortable  HENT:     Head: Normocephalic and atraumatic.  Eyes:     Conjunctiva/sclera: Conjunctivae normal.  Abdominal:      General: There is distension.     Tenderness: There is abdominal tenderness.     Comments: Significantly distended bladder palpable just below umbilicus, no upper abd tenderness  Musculoskeletal:     Cervical back: Neck supple.     Comments: Trace BLE edema  Skin:    General: Skin is warm and dry.  Neurological:     Mental Status: He is alert and oriented to person, place, and time.  Psychiatric:        Judgment: Judgment normal.     ED Results / Procedures / Treatments   Labs (all labs ordered are listed, but  only abnormal results are displayed) Labs Reviewed  URINE CULTURE  URINALYSIS, ROUTINE W REFLEX MICROSCOPIC    EKG None  Radiology No results found.  Procedures Procedures (including critical care time)  Medications Ordered in ED Medications - No data to display  ED Course  I have reviewed the triage vital signs and the nursing notes.  Pertinent labs & imaging results that were available during my care of the patient were reviewed by me and considered in my medical decision making (see chart for details).    MDM Rules/Calculators/A&P                      Bladder scan w/ >800cc urine. Foley inserted, discussed starting on flomax and having f/u with urology. Also counseled on constipation treatment w/ miralax, colace, fiber supplementation, and increased hydration. He does not sound impacted based on several BMs yesterday.   PT's BP was initially severely elevated but I suspect some of that was related to distress of urinary retention. Repeat after foley was improved to 156/92. Counseled on foley care and return precautions. Pt to f/u with [facility], will start on flomax. Final Clinical Impression(s) / ED Diagnoses Final diagnoses:  None    Rx / DC Orders ED Discharge Orders    None       Torey Reinard, Wenda Overland, MD 05/05/19 [phone]

## 2019-05-06 LAB — URINE CULTURE: Culture: NO GROWTH

## 2019-05-15 NOTE — Telephone Encounter (Signed)
Dr. Alvy Bimler, This refill request is over a week old.  Please approve or deny.  I know you do not use the electronic system but I am responsible for cleaning out old messages.  Thanks! Threasa Beards

## 2019-05-16 NOTE — Telephone Encounter (Signed)
Hi Melanie,  I refilled all medications electronically and it appears to me this has been refilled

## 2019-05-18 ENCOUNTER — Other Ambulatory Visit: Payer: Self-pay | Admitting: Cardiovascular Disease

## 2019-06-25 NOTE — H&P (View-Only) (Signed)
06/26/19 9:51 AM   Larry Vasquez 12-25-1950 [id number]  Referring provider: Irine Seal, MD [address] [place],  [place] [address]  Chief Complaint  Patient presents with  . discuss surgery    HPI: Larry Vasquez is a 69 y.o. M with extensive past medical history below including BPH and urinary retention presents today for further evaluation and managmenet.   Visited ED on 05/05/19 c/o of not being able to urinate since noon of previous day. PVR was 800 cc. Discharged with foley inserted and started on tamsulosin.   On 05/14/19, he was started on silodosin in addition to tamsulosin. Urine culture at this visit was positive for Enterococcus, but was resistant to ciprofloxacin.   On 05/17/19, he was started on doxycycline which he had tolerated.   On 05/21/19, he reported of finishing doxycycyline the day before and is on tamsulosin 2 daily.  Underwent UDS on 06/18/19 which indicated max capacity of 575 mls. It was noted that it took a long time to fill bladder given large prostate. 1st sensation felt at 186 mLs. He was able to generate a strong well sustained voluntary contraction but was only able to void a small volume. He had to stand up to generate the contraction.Trabeculation and some elevation of bladder base noted. No reflux.   Discontinued silodosin 8 mg on 06/21/19 since medication cycle completed.   Prostate volume unknown however large medium lobe, trilobar coaptation and 4 cm prostatic urethra noted as per Dr. Ralene Muskrat note. Remains in urinary retention w/ foley in place currently.   He has a past hx of elevated PSA in the 5 range. His most recent PSA 4.2 down from previous range.   No previous hx of prostate bx.   He has a remote hx of urinary retention few years ago.  He requested outlet procedure here since HoLEP not performed at [facility] in [place].   PMH: Past Medical History:  Diagnosis Date  . Anemia   . Cataract    Left eye  . CML (chronic  myelocytic leukemia) ([place])   . Dysrhythmia   . Leukocytosis, unspecified 10/17/2012  . Thrombocytosis ([place]) 10/17/2012  . White coat hypertension 06/06/2014    Surgical History: Past Surgical History:  Procedure Laterality Date  . ATRIAL FIBRILLATION ABLATION [name] 10/08/2016   Procedure: Atrial Fibrillation Ablation;  Surgeon: Constance Haw, MD;  Location: [facility];  Service: Cardiovascular;  Laterality: [name];  . CARDIOVERSION [name] 06/24/2016   Procedure: CARDIOVERSION;  Surgeon: [name], MD;  Location: [facility] ENDOSCOPY;  Service: Cardiovascular;  Laterality: [name];  . CATARACT EXTRACTION Left 01/01/2013  . EYE SURGERY  2012   Detached Retina  . HERNIA REPAIR    . JOINT REPLACEMENT  2006   Total Knee Left  . TONSILLECTOMY      Home Medications:  Allergies as of 06/26/2019      Reactions   Corticosteroids Other (See Comments)   Muscle stiffness      Medication List       Accurate as of June 26, 2019 11:59 PM. If you have any questions, ask your nurse or doctor.        acetaminophen 500 MG tablet Commonly known as: TYLENOL Take 1,000 mg by mouth every 6 (six) hours as needed for moderate pain or headache.   apixaban 5 MG Tabs tablet Commonly known as: Eliquis Take 1 tablet (5 mg total) by mouth 2 (two) times daily.   APPLE CIDER VINEGAR PO Take 15  mLs by mouth daily.   Co Q-10 100 MG Caps Take 100 mg by mouth daily.   dasatinib 50 MG tablet Commonly known as: Sprycel Take 1 tablet (50 mg total) by mouth daily.   Digestive Enzymes Caps Take 1 capsule by mouth daily with lunch.   docusate sodium 100 MG capsule Commonly known as: COLACE Take 1 capsule (100 mg total) by mouth every 12 (twelve) hours.   losartan 25 MG tablet Commonly known as: COZAAR TAKE 1 TABLET BY MOUTH EVERY DAY   magnesium oxide 400 MG tablet Commonly known as: MAG-OX Take 400 mg by mouth at bedtime.   metoprolol 200 MG 24 hr tablet Commonly known as: TOPROL-XL Take 1  tablet (200 mg total) by mouth daily.   MILK THISTLE PO Take 1 capsule by mouth 3 (three) times a week.   multivitamin with minerals Tabs tablet Take 1 tablet by mouth daily.   OMEGA 3 PO Take 1 capsule by mouth daily.   OVER THE COUNTER MEDICATION Take 1 oz by mouth See admin instructions. ASEA dietary supplement: Drink 1 ounce by mouth up to twice daily.   tamsulosin 0.4 MG Caps capsule Commonly known as: FLOMAX Take 1 capsule (0.4 mg total) by mouth daily.   TURMERIC CURCUMIN PO Take 1-2 capsules by mouth daily.   VITAMIN D PO Take 5,000 Units by mouth daily.       Allergies:  Allergies  Allergen Reactions  . Corticosteroids Other (See Comments)    Muscle stiffness    Family History: Family History  Problem Relation Age of Onset  . Arthritis Mother   . Hypertension Mother   . Diabetes Father   . Hypertension Father   . Cancer Father        bladder ca  . CAD Father        CABG  . Chronic Renal Failure Father   . Atrial fibrillation Father   . Healthy Brother   . Healthy Brother     Social History:  reports that he has never smoked. He has never used smokeless tobacco. He reports current alcohol use of about 7.0 standard drinks of alcohol per week. He reports that he does not use drugs.   Physical Exam: BP (!) 214/89   Pulse 69   Ht 6\' 2"  (1.88 m)   Wt 215 lb (97.5 kg)   BMI 27.60 kg/m   Constitutional:  Alert and oriented, No acute distress. HEENT: [place] AT, moist mucus membranes.  Trachea midline, no masses. Cardiovascular: No clubbing, cyanosis, or edema. Respiratory: Normal respiratory effort, no increased work of breathing. Skin: No rashes, bruises or suspicious lesions. Neurologic: Grossly intact, no focal deficits, moving all 4 extremities. Psychiatric: Normal mood and affect.  Laboratory Data:  Lab Results  Component Value Date   CREATININE 1.00 09/25/2018   Assessment & Plan:    1. BPH w/ urinary retention Managed w/ indwelling  Foley catheter  Prostate volume unknown however large medium lobe, trilobar coaptation and 4 cm prostatic urethra noted as per Dr. Ralene Muskrat note Requested outlet procedure here since not performed at [facility] in [place]  Defer prostate sizing at this time  Discussed the pathophysiology of BPH w/ pt  We discussed alternatives including TURP vs. holmium laser enucleation of the prostate. Differences between the surgical procedures were discussed as well as the risks and benefits of each. He is most interested in HoLEP.  We reviewed the surgery in detail today including the preoperative, intraoperative, and postoperative course.  This will most likely be an outpatient procedure pending the degree of post op hematuria.  He will go home with catheter for a few days post op and will either be taught how to remove his own catheter or return to the office for catheter removal.  Risk of bleeding, infection, damage surrounding structures, injury to the bladder/ urethral, bladder neck contracture, ureteral stricture, retrograde ejaculation, stress/ urge incontinence, exacerbation of irritative voiding symptoms were all discussed in detail.   HoLEP specimen for pathology  He has agreed and will proceed w/ plan.   Will need cardiac clearance to hold eliquis  2. Elevated PSA  Most recent PSA 4.2 down from past range in setting of exacerbating urinary symptoms and retention  Check PSA post-op    [facility] [address] [place], [place] [address] [phone]  I, Lucas Mallow, am acting as a scribe for Dr. Hollice Espy,  I have reviewed the above documentation for accuracy and completeness, and I agree with the above.   Hollice Espy, MD

## 2019-06-25 NOTE — Progress Notes (Signed)
06/26/19 9:51 AM   Larry Vasquez 1951-01-08 [id number]  Referring provider: Irine Seal, MD [address] [place],  [place] [address]  Chief Complaint  Patient presents with  . discuss surgery    HPI: Larry Vasquez is a 69 y.o. M with extensive past medical history below including BPH and urinary retention presents today for further evaluation and managmenet.   Visited ED on 05/05/19 c/o of not being able to urinate since noon of previous day. PVR was 800 cc. Discharged with foley inserted and started on tamsulosin.   On 05/14/19, he was started on silodosin in addition to tamsulosin. Urine culture at this visit was positive for Enterococcus, but was resistant to ciprofloxacin.   On 05/17/19, he was started on doxycycline which he had tolerated.   On 05/21/19, he reported of finishing doxycycyline the day before and is on tamsulosin 2 daily.  Underwent UDS on 06/18/19 which indicated max capacity of 575 mls. It was noted that it took a long time to fill bladder given large prostate. 1st sensation felt at 186 mLs. He was able to generate a strong well sustained voluntary contraction but was only able to void a small volume. He had to stand up to generate the contraction.Trabeculation and some elevation of bladder base noted. No reflux.   Discontinued silodosin 8 mg on 06/21/19 since medication cycle completed.   Prostate volume unknown however large medium lobe, trilobar coaptation and 4 cm prostatic urethra noted as per Dr. Ralene Muskrat note. Remains in urinary retention w/ foley in place currently.   He has a past hx of elevated PSA in the 5 range. His most recent PSA 4.2 down from previous range.   No previous hx of prostate bx.   He has a remote hx of urinary retention few years ago.  He requested outlet procedure here since HoLEP not performed at [facility] in [place].   PMH: Past Medical History:  Diagnosis Date  . Anemia   . Cataract    Left eye  . CML (chronic  myelocytic leukemia) ([place])   . Dysrhythmia   . Leukocytosis, unspecified 10/17/2012  . Thrombocytosis ([place]) 10/17/2012  . White coat hypertension 06/06/2014    Surgical History: Past Surgical History:  Procedure Laterality Date  . ATRIAL FIBRILLATION ABLATION [name] 10/08/2016   Procedure: Atrial Fibrillation Ablation;  Surgeon: Constance Haw, MD;  Location: [facility];  Service: Cardiovascular;  Laterality: [name];  . CARDIOVERSION [name] 06/24/2016   Procedure: CARDIOVERSION;  Surgeon: [name], MD;  Location: [facility] ENDOSCOPY;  Service: Cardiovascular;  Laterality: [name];  . CATARACT EXTRACTION Left 01/01/2013  . EYE SURGERY  2012   Detached Retina  . HERNIA REPAIR    . JOINT REPLACEMENT  2006   Total Knee Left  . TONSILLECTOMY      Home Medications:  Allergies as of 06/26/2019      Reactions   Corticosteroids Other (See Comments)   Muscle stiffness      Medication List       Accurate as of June 26, 2019 11:59 PM. If you have any questions, ask your nurse or doctor.        acetaminophen 500 MG tablet Commonly known as: TYLENOL Take 1,000 mg by mouth every 6 (six) hours as needed for moderate pain or headache.   apixaban 5 MG Tabs tablet Commonly known as: Eliquis Take 1 tablet (5 mg total) by mouth 2 (two) times daily.   APPLE CIDER VINEGAR PO Take 15  mLs by mouth daily.   Co Q-10 100 MG Caps Take 100 mg by mouth daily.   dasatinib 50 MG tablet Commonly known as: Sprycel Take 1 tablet (50 mg total) by mouth daily.   Digestive Enzymes Caps Take 1 capsule by mouth daily with lunch.   docusate sodium 100 MG capsule Commonly known as: COLACE Take 1 capsule (100 mg total) by mouth every 12 (twelve) hours.   losartan 25 MG tablet Commonly known as: COZAAR TAKE 1 TABLET BY MOUTH EVERY DAY   magnesium oxide 400 MG tablet Commonly known as: MAG-OX Take 400 mg by mouth at bedtime.   metoprolol 200 MG 24 hr tablet Commonly known as: TOPROL-XL Take 1  tablet (200 mg total) by mouth daily.   MILK THISTLE PO Take 1 capsule by mouth 3 (three) times a week.   multivitamin with minerals Tabs tablet Take 1 tablet by mouth daily.   OMEGA 3 PO Take 1 capsule by mouth daily.   OVER THE COUNTER MEDICATION Take 1 oz by mouth See admin instructions. ASEA dietary supplement: Drink 1 ounce by mouth up to twice daily.   tamsulosin 0.4 MG Caps capsule Commonly known as: FLOMAX Take 1 capsule (0.4 mg total) by mouth daily.   TURMERIC CURCUMIN PO Take 1-2 capsules by mouth daily.   VITAMIN D PO Take 5,000 Units by mouth daily.       Allergies:  Allergies  Allergen Reactions  . Corticosteroids Other (See Comments)    Muscle stiffness    Family History: Family History  Problem Relation Age of Onset  . Arthritis Mother   . Hypertension Mother   . Diabetes Father   . Hypertension Father   . Cancer Father        bladder ca  . CAD Father        CABG  . Chronic Renal Failure Father   . Atrial fibrillation Father   . Healthy Brother   . Healthy Brother     Social History:  reports that he has never smoked. He has never used smokeless tobacco. He reports current alcohol use of about 7.0 standard drinks of alcohol per week. He reports that he does not use drugs.   Physical Exam: BP (!) 214/89   Pulse 69   Ht 6\' 2"  (1.88 m)   Wt 215 lb (97.5 kg)   BMI 27.60 kg/m   Constitutional:  Alert and oriented, No acute distress. HEENT: [place] AT, moist mucus membranes.  Trachea midline, no masses. Cardiovascular: No clubbing, cyanosis, or edema. Respiratory: Normal respiratory effort, no increased work of breathing. Skin: No rashes, bruises or suspicious lesions. Neurologic: Grossly intact, no focal deficits, moving all 4 extremities. Psychiatric: Normal mood and affect.  Laboratory Data:  Lab Results  Component Value Date   CREATININE 1.00 09/25/2018   Assessment & Plan:    1. BPH w/ urinary retention Managed w/ indwelling  Foley catheter  Prostate volume unknown however large medium lobe, trilobar coaptation and 4 cm prostatic urethra noted as per Dr. Ralene Muskrat note Requested outlet procedure here since not performed at [facility] in [place]  Defer prostate sizing at this time  Discussed the pathophysiology of BPH w/ pt  We discussed alternatives including TURP vs. holmium laser enucleation of the prostate. Differences between the surgical procedures were discussed as well as the risks and benefits of each. He is most interested in HoLEP.  We reviewed the surgery in detail today including the preoperative, intraoperative, and postoperative course.  This will most likely be an outpatient procedure pending the degree of post op hematuria.  He will go home with catheter for a few days post op and will either be taught how to remove his own catheter or return to the office for catheter removal.  Risk of bleeding, infection, damage surrounding structures, injury to the bladder/ urethral, bladder neck contracture, ureteral stricture, retrograde ejaculation, stress/ urge incontinence, exacerbation of irritative voiding symptoms were all discussed in detail.   HoLEP specimen for pathology  He has agreed and will proceed w/ plan.   Will need cardiac clearance to hold eliquis  2. Elevated PSA  Most recent PSA 4.2 down from past range in setting of exacerbating urinary symptoms and retention  Check PSA post-op    [facility] [address], [place], [place] [address] [phone]  I, Lucas Mallow, am acting as a scribe for Dr. Hollice Espy,  I have reviewed the above documentation for accuracy and completeness, and I agree with the above.   Hollice Espy, MD

## 2019-06-26 ENCOUNTER — Other Ambulatory Visit: Payer: Self-pay | Admitting: Radiology

## 2019-06-26 ENCOUNTER — Telehealth: Payer: Self-pay

## 2019-06-26 ENCOUNTER — Other Ambulatory Visit: Payer: Self-pay

## 2019-06-26 ENCOUNTER — Ambulatory Visit: Payer: 59 | Admitting: Urology

## 2019-06-26 VITALS — BP 214/89 | HR 69 | Ht 74.0 in | Wt 215.0 lb

## 2019-06-26 DIAGNOSIS — N400 Enlarged prostate without lower urinary tract symptoms: Secondary | ICD-10-CM

## 2019-06-26 DIAGNOSIS — R339 Retention of urine, unspecified: Secondary | ICD-10-CM | POA: Diagnosis not present

## 2019-06-26 DIAGNOSIS — N401 Enlarged prostate with lower urinary tract symptoms: Secondary | ICD-10-CM

## 2019-06-26 DIAGNOSIS — N138 Other obstructive and reflux uropathy: Secondary | ICD-10-CM

## 2019-06-26 DIAGNOSIS — R972 Elevated prostate specific antigen [PSA]: Secondary | ICD-10-CM

## 2019-06-26 LAB — MICROSCOPIC EXAMINATION

## 2019-06-26 LAB — URINALYSIS, COMPLETE
Bilirubin, UA: NEGATIVE
Glucose, UA: NEGATIVE
Ketones, UA: NEGATIVE
Nitrite, UA: POSITIVE — AB
Protein,UA: NEGATIVE
Specific Gravity, UA: 1.02 (ref 1.005–1.030)
Urobilinogen, Ur: 0.2 mg/dL (ref 0.2–1.0)
pH, UA: 7 (ref 5.0–7.5)

## 2019-06-26 NOTE — Patient Instructions (Signed)

## 2019-06-26 NOTE — Telephone Encounter (Signed)
   [facility] Pre-operative Risk Assessment    Request for surgical clearance:  1. What type of surgery is being performed? HOLMIUM LASER ENUCLEATION OF PROSTATE  2. When is this surgery scheduled? 07/09/19   3. What type of clearance is required (medical clearance vs. Pharmacy clearance to hold med vs. Both)? BOTH  4. Are there any medications that need to be held prior to surgery and how long? ELIQUIS X 3 DAYS   5. Practice name and name of physician performing surgery? [facility]; Hollice Espy, MD   6. What is your office phone number [phone]    7.   What is your office fax number [phone]  8.   Anesthesia type (None, local, MAC, general) ? NONE  LISTED   Larry Vasquez 06/26/2019, 2:28 PM  _________________________________________________________________   (provider comments below)

## 2019-06-27 ENCOUNTER — Telehealth: Payer: Self-pay | Admitting: Physician Assistant

## 2019-06-27 ENCOUNTER — Telehealth: Payer: Self-pay

## 2019-06-27 NOTE — Telephone Encounter (Signed)
Primary [place], MD  Chart reviewed as part of pre-operative protocol coverage. Because of Larry Vasquez past medical history and time since last visit, he/she will require a follow-up visit in order to better assess preoperative cardiovascular risk.  Pre-op covering staff: - Please schedule appointment and call patient to inform them. - Please contact requesting surgeon's office via preferred method (i.e, phone, fax) to inform them of need for appointment prior to surgery.  If applicable, this message will also be routed to pharmacy pool and/or primary cardiologist for input on holding anticoagulant/antiplatelet agent as requested below so that this information is available at time of patient's appointment.   Deberah Pelton, NP  06/27/2019, 11:19 AM

## 2019-06-27 NOTE — Telephone Encounter (Signed)
Patient called stating that yesterday after he left the office there was a significant amount of thick clotted blood in his leg bad. He was unable to drain catheter due to blood clotting in bag. He states that he was able to exchange bags and is no longer having blood in the bag. He was encouraged to keep drinking water and to monitor the bag if blood becomes thick and clotted again he should contact this office. Patient was informed that some blood noted with a cath in place can happen he is also on Eliquis and this can make bleeding worse. Patient denies fever, chills, nausea or vomiting. Patient verbalized understanding

## 2019-06-27 NOTE — Telephone Encounter (Signed)
Follow up  Pt is returning call from [place].  Please call

## 2019-06-27 NOTE — Telephone Encounter (Signed)
Appointment schedule with Dr Marlou Porch Tuesday April 27th @ 9:40 am

## 2019-06-27 NOTE — Telephone Encounter (Signed)
Pt left vm he states he was going to come by the office today and pick up a urinary leg bag. He was able to go to [facility] and get one there.

## 2019-06-28 ENCOUNTER — Other Ambulatory Visit: Payer: Self-pay

## 2019-06-28 ENCOUNTER — Encounter
Admission: RE | Admit: 2019-06-28 | Discharge: 2019-06-28 | Disposition: A | Discharge status: Home / Self Care | Payer: 59 | Source: Ambulatory Visit | Attending: Urology | Admitting: Urology

## 2019-06-28 HISTORY — DX: Anemia, unspecified: D64.9

## 2019-06-28 HISTORY — DX: Cardiac arrhythmia, unspecified: I49.9

## 2019-06-28 NOTE — Patient Instructions (Addendum)
Your procedure is scheduled on: 07-09-19 MONDAY Report to Same Day Surgery 2nd floor medical mall [facility] Entrance-take elevator on left to 2nd floor.  Check in with surgery information desk.) To find out your arrival time please call [phone] between 1PM - 3PM on 07-06-19 FRIDAY  Remember: Instructions that are not followed completely may result in serious medical risk, up to and including death, or upon the discretion of your surgeon and anesthesiologist your surgery may need to be rescheduled.    _x___ 1. Do not eat food after midnight the night before your procedure. NO GUM OR CANDY AFTER MIDNIGHT. You may drink clear liquids up to 2 hours before you are scheduled to arrive at the hospital for your procedure.  Do not drink clear liquids within 2 hours of your scheduled arrival to the hospital.  Clear liquids include  --Water or Apple juice without pulp  --Gatorade  --Black Coffee or Clear Tea (No milk, no creamers, do not add anything to the coffee or Tea   ____Ensure clear carbohydrate drink on the way to the hospital for bariatric patients  ____Ensure clear carbohydrate drink 3 hours before surgery.     __x__ 2. No Alcohol for 24 hours before or after surgery.   __x__3. No Smoking or e-cigarettes for 24 prior to surgery.  Do not use any chewable tobacco products for at least 6 hour prior to surgery   ____  4. Bring all medications with you on the day of surgery if instructed.    __x__ 5. Notify your doctor if there is any change in your medical condition     (cold, fever, infections).    x___6. On the morning of surgery brush your teeth with toothpaste and water.  You may rinse your mouth with mouth wash if you wish.  Do not swallow any toothpaste or mouthwash.   Do not wear jewelry, make-up, hairpins, clips or nail polish.  Do not wear lotions, powders, or perfumes. You may wear deodorant.  Do not shave 48 hours prior to surgery. Men may shave face and neck.  Do not  bring valuables to the hospital.    [facility] is not responsible for any belongings or valuables.               Contacts, dentures or bridgework may not be worn into surgery.  Leave your suitcase in the car. After surgery it may be brought to your room.  For patients admitted to the hospital, discharge time is determined by your treatment team.  _  Patients discharged the day of surgery will not be allowed to drive home.  You will need someone to drive you home and stay with you the night of your procedure.    Please read over the following fact sheets that you were given:   [facility] Preparing for Surgery and or MRSA Information   _x___ TAKE THE FOLLOWING MEDICATION THE MORNING OF SURGERY WITH A SMALL SIP OF WATER. These include:  1. METOPROLOL (TOPROL)  2. FLOMAX (TAMSULOSIN)  3. SPRYCEL (DASATINIB)  4.  5.  6.  ____Fleets enema or Magnesium Citrate as directed.   _x___ Use CHG Soap or sage wipes as directed on instruction sheet   ____ Use inhalers on the day of surgery and bring to hospital day of surgery  ____ Stop Metformin and Janumet 2 days prior to surgery.    ____ Take 1/2 of usual insulin dose the night before surgery and none on the morning  surgery.   ____ Follow recommendations from Cardiologist, Pulmonologist or PCP regarding  stopping Aspirin, Coumadin, Plavix ,Eliquis, Effient, or Pradaxa, and Pletal-INSTRUCTED BY DR Cherrie Gauze OFFICE TO STOP ELIQUIS ON 07-06-19  X____Stop Anti-inflammatories such as Advil, Aleve, Ibuprofen, Motrin, Naproxen, Naprosyn, Goodies powders or aspirin products 7 DAYS PRIOR TO SURGERY-OK to take Tylenol    _x___ Stop supplements until after surgery-STOP MILK THISTLE, OMEGA 3, TURMERIC, APPLE CIDER VINEGAR, AND CO Q-10 7 DAYS PRIOR TO SURGERY-MAY RESUME AFTER SURGERY   ____ Bring C-Pap to the hospital.

## 2019-06-29 ENCOUNTER — Telehealth: Payer: Self-pay | Admitting: Radiology

## 2019-06-29 ENCOUNTER — Other Ambulatory Visit: Payer: Self-pay | Admitting: Radiology

## 2019-06-29 DIAGNOSIS — N390 Urinary tract infection, site not specified: Secondary | ICD-10-CM

## 2019-06-29 DIAGNOSIS — R319 Hematuria, unspecified: Secondary | ICD-10-CM

## 2019-06-29 MED ORDER — CIPROFLOXACIN HCL 500 MG PO TABS: 500.0000 mg | ORAL_TABLET | Freq: Two times a day (BID) | ORAL | 0 refills | Status: DC

## 2019-06-29 NOTE — Telephone Encounter (Signed)
-----   Message from Hollice Espy, MD sent at 06/29/2019  1:40 PM EDT ----- I would like this patient to take Cipro 500 mg twice daily starting 3 days before the procedure for total of 5 days.  Hollice Espy, MD

## 2019-06-29 NOTE — Telephone Encounter (Signed)
LMOM that script was sent to pharmacy & to begin on 07/06/2019.

## 2019-06-29 NOTE — Telephone Encounter (Signed)
Patient confirmed receipt of message regarding script sent to pharmacy.

## 2019-07-03 ENCOUNTER — Encounter: Payer: Self-pay | Admitting: Cardiology

## 2019-07-03 ENCOUNTER — Ambulatory Visit: Payer: 59 | Admitting: Cardiology

## 2019-07-03 ENCOUNTER — Other Ambulatory Visit: Payer: Self-pay

## 2019-07-03 VITALS — BP 160/80 | HR 68 | Ht 74.0 in | Wt 218.0 lb

## 2019-07-03 DIAGNOSIS — I10 Essential (primary) hypertension: Secondary | ICD-10-CM

## 2019-07-03 DIAGNOSIS — I480 Paroxysmal atrial fibrillation: Secondary | ICD-10-CM | POA: Diagnosis not present

## 2019-07-03 DIAGNOSIS — Z01810 Encounter for preprocedural cardiovascular examination: Secondary | ICD-10-CM

## 2019-07-03 NOTE — Patient Instructions (Addendum)
Medication Instructions:  You may discontinue your Eliquis. Continue all other medications as listed.  *If you need a refill on your cardiac medications before your next appointment, please call your pharmacy*  Follow-Up: At [facility], you and your health needs are our priority.  As part of our continuing mission to provide you with exceptional heart care, we have created designated Provider Care Teams.  These Care Teams include your primary Cardiologist (physician) and Advanced Practice Providers (APPs -  Physician Assistants and Nurse Practitioners) who all work together to provide you with the care you need, when you need it.  We recommend signing up for the patient portal called "MyChart".  Sign up information is provided on this After Visit Summary.  MyChart is used to connect with patients for Virtual Visits (Telemedicine).  Patients are able to view lab/test results, encounter notes, upcoming appointments, etc.  Non-urgent messages can be sent to your provider as well.   To learn more about what you can do with MyChart, go to [email or web address].    Your next appointment:   12 month(s)  The format for your next appointment:   In Person  Provider:   Candee Furbish, MD   Thank you for choosing [facility]!!

## 2019-07-03 NOTE — Progress Notes (Signed)
Cardiology Office Note:    Date:  07/03/2019   ID:  Larry Vasquez, DOB 05/24/1950, MRN [id number]  PCP:  [name], MD  Cardiologist:  Candee Furbish, MD  Electrophysiologist:  None   Referring MD: [name], MD     History of Present Illness:    Larry Vasquez is a 69 y.o. male here for preoperative risk for prostate enucleation by urology.  Request is to hold Eliquis for 3 days.  Has atrial fibrillation post ablation by Dr. Curt Bears in 2018.  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  Overall he is been doing quite well.  Does have some whitecoat hypertension but otherwise no fevers chills nausea vomiting syncope bleeding.  Plays golf.  Walks the course.  No anginal symptoms.  No recurrence of atrial fibrillation.  No bleeding.  We discussed stopping his Eliquis today.  We were contemplating this at last appointment.  Past Medical History:  Diagnosis Date  . Anemia   . Cataract    Left eye  . CML (chronic myelocytic leukemia) ([place])   . Dysrhythmia   . Leukocytosis, unspecified 10/17/2012  . Thrombocytosis ([place]) 10/17/2012  . White coat hypertension 06/06/2014    Past Surgical History:  Procedure Laterality Date  . ATRIAL FIBRILLATION ABLATION [name] 10/08/2016   Procedure: Atrial Fibrillation Ablation;  Surgeon: Constance Haw, MD;  Location: [place] [facility];  Service: Cardiovascular;  Laterality: [name];  . CARDIOVERSION [name] 06/24/2016   Procedure: CARDIOVERSION;  Surgeon: [name], MD;  Location: [facility] ENDOSCOPY;  Service: Cardiovascular;  Laterality: [name];  . CATARACT EXTRACTION Left 01/01/2013  . EYE SURGERY  2012   Detached Retina  . HERNIA REPAIR    . JOINT REPLACEMENT  2006   Total Knee Left  . TONSILLECTOMY      Current Medications: Current Meds  Medication Sig  . acetaminophen (TYLENOL) 500 MG tablet Take 1,000 mg by mouth every 6 (six) hours as needed for moderate pain or  headache.   . APPLE CIDER VINEGAR PO Take 15 mLs by mouth daily.   . Cholecalciferol (VITAMIN D PO) Take 5,000 Units by mouth daily.  Derrill Memo ON 07/06/2019] ciprofloxacin (CIPRO) 500 MG tablet Take 1 tablet (500 mg total) by mouth every 12 (twelve) hours. Begin medication on 07/06/2019  . Coenzyme Q10 (CO Q-10) 100 MG CAPS Take 100 mg by mouth daily.  . dasatinib (SPRYCEL) 50 MG tablet Take 1 tablet (50 mg total) by mouth daily.  . Digestive Enzymes (DIGESTIVE ENZYME PO) Take 1 tablet by mouth as needed.  Marland Kitchen losartan (COZAAR) 25 MG tablet TAKE 1 TABLET BY MOUTH EVERY DAY  . metoprolol (TOPROL-XL) 200 MG 24 hr tablet Take 1 tablet (200 mg total) by mouth daily.  Marland Kitchen MILK THISTLE PO Take 1 capsule by mouth 3 (three) times a week.   . Multiple Vitamin (MULTIVITAMIN WITH MINERALS) TABS tablet Take 1 tablet by mouth daily.  . Multiple Vitamins-Minerals (ZINC PO) Take 1 tablet by mouth 3 (three) times a week.  . Omega-3 Fatty Acids (OMEGA 3 PO) Take 1 capsule by mouth daily.  Marland Kitchen OVER THE COUNTER MEDICATION Take 1 packet by mouth at bedtime. NATURAL CALM MAGNESIUM POWDER-MIXES IN DRINK  . tamsulosin (FLOMAX) 0.4 MG CAPS capsule Take 1 capsule (0.4 mg total) by mouth daily.  . TURMERIC CURCUMIN PO Take 1-2 capsules by mouth daily.   . [DISCONTINUED] apixaban (ELIQUIS) 5 MG TABS tablet Take  1 tablet (5 mg total) by mouth 2 (two) times daily.     Allergies:   Corticosteroids   Social History   Socioeconomic History  . Marital status: Married    Spouse name: Not on file  . Number of children: Not on file  . Years of education: Not on file  . Highest education level: Not on file  Occupational History  . Not on file  Tobacco Use  . Smoking status: Never Smoker  . Smokeless tobacco: Never Used  Substance and Sexual Activity  . Alcohol use: Yes    Alcohol/week: 7.0 standard drinks    Types: 7 Glasses of wine per week    Comment: daily wine   . Drug use: No  . Sexual activity: Yes    Birth  control/protection: None  Other Topics Concern  . Not on file  Social History Narrative  . Not on file   Social Determinants of Health   Financial Resource Strain:   . Difficulty of Paying Living Expenses:   Food Insecurity:   . Worried About Charity fundraiser in the Last Year:   . Arboriculturist in the Last Year:   Transportation Needs:   . Film/video editor (Medical):   Marland Kitchen Lack of Transportation (Non-Medical):   Physical Activity:   . Days of Exercise per Week:   . Minutes of Exercise per Session:   Stress:   . Feeling of Stress :   Social Connections:   . Frequency of Communication with Friends and Family:   . Frequency of Social Gatherings with Friends and Family:   . Attends Religious Services:   . Active Member of Clubs or Organizations:   . Attends Archivist Meetings:   Marland Kitchen Marital Status:      Family History: The patient's family history includes Arthritis in his mother; Atrial fibrillation in his father; CAD in his father; Cancer in his father; Chronic Renal Failure in his father; Diabetes in his father; Healthy in his brother and brother; Hypertension in his father and mother.  ROS:   Please see the history of present illness.    No fevers chills nausea vomiting syncope bleeding all other systems reviewed and are negative.  EKGs/Labs/Other Studies Reviewed:    The following studies were reviewed today: Prior echo normal EF  EKG:  EKG is not ordered today.  Previously normal rhythm  Recent Labs: 09/25/2018: ALT 16; BUN 16; Creatinine 1.00; Potassium 4.3; Sodium 139 03/23/2019: Hemoglobin 13.1; Platelets 155  Recent Lipid Panel No results found for: CHOL, TRIG, HDL, CHOLHDL, VLDL, LDLCALC, LDLDIRECT  Physical Exam:    VS:  BP (!) 160/80   Pulse 68   Ht 6\' 2"  (1.88 m)   Wt 218 lb (98.9 kg)   SpO2 97%   BMI 27.99 kg/m     Wt Readings from Last 3 Encounters:  07/03/19 218 lb (98.9 kg)  06/26/19 215 lb (97.5 kg)  05/05/19 210 lb (95.3  kg)     GEN:  Well nourished, well developed in no acute distress HEENT: Normal NECK: No JVD; No carotid bruits LYMPHATICS: No lymphadenopathy CARDIAC: RRR, no murmurs, rubs, gallops RESPIRATORY:  Clear to auscultation without rales, wheezing or rhonchi  ABDOMEN: Soft, non-tender, non-distended MUSCULOSKELETAL:  No edema; No deformity wearing urinary catheter SKIN: Warm and dry NEUROLOGIC:  Alert and oriented x 3 PSYCHIATRIC:  Normal affect   ASSESSMENT:    1. PAF (paroxysmal atrial fibrillation) ([place])   2. Preop cardiovascular  exam   3. Essential hypertension    PLAN:    In order of problems listed above:  Preop cardiac risk for urologic procedure, prostate urinary obstruction Paroxysmal atrial fibrillation-no recurrence since ablation -He may proceed with low overall cardiac risk.  EF at last check normal.  He has been in normal sinus rhythm.  He has not had any recurrence of atrial fibrillation since his ablation in 2018.  Because of this, we will go ahead and stop his Eliquis.  He understands that if this does return we will need to resume the anticoagulation.  For now, I think it is a good opportunity for him to get off of this medication.  Whitecoat hypertension/essential hypertension -He is on both Toprol and Cozaar.  Continue.  Mildly elevated today as usual in the doctor's office.  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  Continue with Toprol and losartan.  Dr. Nena Polio is urologist that is going to be performing procedure in [place].  We will send her a letter.  Medication Adjustments/Labs and Tests Ordered: Current medicines are reviewed at length with the patient today.  Concerns regarding medicines are outlined above.  No orders of the defined types were placed in this encounter.  No orders of the defined types were placed in this encounter.   Patient Instructions  Medication Instructions:  You may discontinue your  Eliquis. Continue all other medications as listed.  *If you need a refill on your cardiac medications before your next appointment, please call your pharmacy*  Follow-Up: At [facility], you and your health needs are our priority.  As part of our continuing mission to provide you with exceptional heart care, we have created designated Provider Care Teams.  These Care Teams include your primary Cardiologist (physician) and Advanced Practice Providers (APPs -  Physician Assistants and Nurse Practitioners) who all work together to provide you with the care you need, when you need it.  We recommend signing up for the patient portal called "MyChart".  Sign up information is provided on this After Visit Summary.  MyChart is used to connect with patients for Virtual Visits (Telemedicine).  Patients are able to view lab/test results, encounter notes, upcoming appointments, etc.  Non-urgent messages can be sent to your provider as well.   To learn more about what you can do with MyChart, go to [email or web address].    Your next appointment:   12 month(s)  The format for your next appointment:   In Person  Provider:   Candee Furbish, MD   Thank you for choosing [facility]!!        Signed, Candee Furbish, MD  07/03/2019 10:27 AM    [place]

## 2019-07-04 LAB — CULTURE, URINE COMPREHENSIVE

## 2019-07-05 ENCOUNTER — Other Ambulatory Visit: Payer: Self-pay

## 2019-07-05 ENCOUNTER — Other Ambulatory Visit
Admission: RE | Admit: 2019-07-05 | Discharge: 2019-07-05 | Disposition: A | Discharge status: Home / Self Care | Payer: 59 | Source: Ambulatory Visit | Attending: Urology | Admitting: Urology

## 2019-07-05 DIAGNOSIS — Z20822 Contact with and (suspected) exposure to covid-19: Secondary | ICD-10-CM | POA: Diagnosis not present

## 2019-07-05 DIAGNOSIS — Z01812 Encounter for preprocedural laboratory examination: Secondary | ICD-10-CM | POA: Diagnosis present

## 2019-07-05 LAB — SARS CORONAVIRUS 2 (TAT 6-24 HRS): SARS Coronavirus 2: NEGATIVE

## 2019-07-09 ENCOUNTER — Other Ambulatory Visit: Payer: Self-pay

## 2019-07-09 ENCOUNTER — Encounter: Payer: Self-pay | Admitting: Urology

## 2019-07-09 ENCOUNTER — Encounter
Admission: RE | Disposition: A | Discharge status: Home / Self Care | Payer: Self-pay | Source: Home / Self Care | Attending: Urology

## 2019-07-09 ENCOUNTER — Observation Stay
Admission: RE | Admit: 2019-07-09 | Discharge: 2019-07-10 | Disposition: A | Discharge status: Home / Self Care | Payer: No Typology Code available for payment source | Attending: Urology | Admitting: Urology

## 2019-07-09 ENCOUNTER — Ambulatory Visit: Payer: No Typology Code available for payment source | Admitting: Anesthesiology

## 2019-07-09 DIAGNOSIS — R338 Other retention of urine: Secondary | ICD-10-CM

## 2019-07-09 DIAGNOSIS — C9210 Chronic myeloid leukemia, BCR/ABL-positive, not having achieved remission: Secondary | ICD-10-CM | POA: Diagnosis not present

## 2019-07-09 DIAGNOSIS — I4891 Unspecified atrial fibrillation: Secondary | ICD-10-CM | POA: Insufficient documentation

## 2019-07-09 DIAGNOSIS — I509 Heart failure, unspecified: Secondary | ICD-10-CM | POA: Diagnosis not present

## 2019-07-09 DIAGNOSIS — Z79899 Other long term (current) drug therapy: Secondary | ICD-10-CM | POA: Insufficient documentation

## 2019-07-09 DIAGNOSIS — D649 Anemia, unspecified: Secondary | ICD-10-CM | POA: Diagnosis not present

## 2019-07-09 DIAGNOSIS — N138 Other obstructive and reflux uropathy: Secondary | ICD-10-CM | POA: Diagnosis present

## 2019-07-09 DIAGNOSIS — N320 Bladder-neck obstruction: Secondary | ICD-10-CM | POA: Insufficient documentation

## 2019-07-09 DIAGNOSIS — N401 Enlarged prostate with lower urinary tract symptoms: Secondary | ICD-10-CM | POA: Diagnosis present

## 2019-07-09 DIAGNOSIS — I110 Hypertensive heart disease with heart failure: Secondary | ICD-10-CM | POA: Diagnosis not present

## 2019-07-09 HISTORY — PX: HOLEP-LASER ENUCLEATION OF THE PROSTATE WITH MORCELLATION: SHX6641

## 2019-07-09 SURGERY — ENUCLEATION, PROSTATE, USING LASER, WITH MORCELLATION
Anesthesia: General | Site: Prostate

## 2019-07-09 MED ORDER — EPHEDRINE 5 MG/ML INJ
INTRAVENOUS | Status: AC
  Filled 2019-07-09: qty 10

## 2019-07-09 MED ORDER — TAMSULOSIN HCL 0.4 MG PO CAPS
0.4000 mg | ORAL_CAPSULE | Freq: Every day | ORAL | Status: DC
  Administered 2019-07-10: 0.4 mg via ORAL
  Filled 2019-07-09: qty 1

## 2019-07-09 MED ORDER — FENTANYL CITRATE (PF) 100 MCG/2ML IJ SOLN: 25.0000 ug | INTRAMUSCULAR | Status: DC | PRN

## 2019-07-09 MED ORDER — LACTATED RINGERS IV SOLN: INTRAVENOUS | Status: DC

## 2019-07-09 MED ORDER — SODIUM CHLORIDE 0.9 % IR SOLN
3000.0000 mL | Status: DC
  Administered 2019-07-09: 3000 mL

## 2019-07-09 MED ORDER — CHLORHEXIDINE GLUCONATE CLOTH 2 % EX PADS: 6.0000 | MEDICATED_PAD | Freq: Every day | CUTANEOUS | Status: DC

## 2019-07-09 MED ORDER — CEFAZOLIN SODIUM-DEXTROSE 2-4 GM/100ML-% IV SOLN
INTRAVENOUS | Status: AC
  Filled 2019-07-09: qty 100

## 2019-07-09 MED ORDER — ONDANSETRON HCL 4 MG/2ML IJ SOLN
INTRAMUSCULAR | Status: AC
  Filled 2019-07-09: qty 4

## 2019-07-09 MED ORDER — FUROSEMIDE 10 MG/ML IJ SOLN
INTRAMUSCULAR | Status: DC | PRN
  Administered 2019-07-09: 10 mg via INTRAMUSCULAR

## 2019-07-09 MED ORDER — SODIUM CHLORIDE 0.9 % IV SOLN: INTRAVENOUS | Status: DC

## 2019-07-09 MED ORDER — LACTATED RINGERS IV SOLN: INTRAVENOUS | Status: DC | PRN

## 2019-07-09 MED ORDER — LIDOCAINE HCL (PF) 2 % IJ SOLN
INTRAMUSCULAR | Status: AC
  Filled 2019-07-09: qty 25

## 2019-07-09 MED ORDER — MIDAZOLAM HCL 2 MG/2ML IJ SOLN
INTRAMUSCULAR | Status: DC | PRN
  Administered 2019-07-09 (×2): 1 mg via INTRAVENOUS

## 2019-07-09 MED ORDER — PROPOFOL 10 MG/ML IV BOLUS
INTRAVENOUS | Status: AC
  Filled 2019-07-09: qty 20

## 2019-07-09 MED ORDER — EPHEDRINE SULFATE 50 MG/ML IJ SOLN
INTRAMUSCULAR | Status: DC | PRN
  Administered 2019-07-09: 5 mg via INTRAVENOUS

## 2019-07-09 MED ORDER — METOPROLOL SUCCINATE ER 100 MG PO TB24
200.0000 mg | ORAL_TABLET | Freq: Every day | ORAL | Status: DC
  Administered 2019-07-10: 200 mg via ORAL
  Filled 2019-07-09: qty 2

## 2019-07-09 MED ORDER — ONDANSETRON HCL 4 MG/2ML IJ SOLN
INTRAMUSCULAR | Status: DC | PRN
  Administered 2019-07-09 (×2): 4 mg via INTRAVENOUS

## 2019-07-09 MED ORDER — HYDROMORPHONE HCL 1 MG/ML IJ SOLN
INTRAMUSCULAR | Status: DC | PRN
  Administered 2019-07-09 (×2): .25 mg via INTRAVENOUS

## 2019-07-09 MED ORDER — MIDAZOLAM HCL 2 MG/2ML IJ SOLN
INTRAMUSCULAR | Status: AC
  Filled 2019-07-09: qty 2

## 2019-07-09 MED ORDER — ROCURONIUM BROMIDE 10 MG/ML (PF) SYRINGE
PREFILLED_SYRINGE | INTRAVENOUS | Status: AC
  Filled 2019-07-09: qty 10

## 2019-07-09 MED ORDER — GLYCOPYRROLATE 0.2 MG/ML IJ SOLN
INTRAMUSCULAR | Status: AC
  Filled 2019-07-09: qty 2

## 2019-07-09 MED ORDER — DIPHENHYDRAMINE HCL 50 MG/ML IJ SOLN: 12.5000 mg | Freq: Four times a day (QID) | INTRAMUSCULAR | Status: DC | PRN

## 2019-07-09 MED ORDER — LIDOCAINE HCL (CARDIAC) PF 100 MG/5ML IV SOSY
PREFILLED_SYRINGE | INTRAVENOUS | Status: DC | PRN
  Administered 2019-07-09: 100 mg via INTRAVENOUS

## 2019-07-09 MED ORDER — LABETALOL HCL 5 MG/ML IV SOLN
INTRAVENOUS | Status: AC
  Filled 2019-07-09: qty 4

## 2019-07-09 MED ORDER — OXYBUTYNIN CHLORIDE 5 MG PO TABS
5.0000 mg | ORAL_TABLET | Freq: Three times a day (TID) | ORAL | Status: DC | PRN
  Administered 2019-07-09: 5 mg via ORAL
  Filled 2019-07-09: qty 1

## 2019-07-09 MED ORDER — ONDANSETRON HCL 4 MG/2ML IJ SOLN: 4.0000 mg | Freq: Once | INTRAMUSCULAR | Status: DC | PRN

## 2019-07-09 MED ORDER — GLYCOPYRROLATE 0.2 MG/ML IJ SOLN
INTRAMUSCULAR | Status: DC | PRN
  Administered 2019-07-09: .2 mg via INTRAVENOUS

## 2019-07-09 MED ORDER — FENTANYL CITRATE (PF) 100 MCG/2ML IJ SOLN
INTRAMUSCULAR | Status: AC
  Filled 2019-07-09: qty 2

## 2019-07-09 MED ORDER — HYDROMORPHONE HCL 1 MG/ML IJ SOLN
INTRAMUSCULAR | Status: AC
  Filled 2019-07-09: qty 1

## 2019-07-09 MED ORDER — ACETAMINOPHEN 10 MG/ML IV SOLN
INTRAVENOUS | Status: DC | PRN
  Administered 2019-07-09: 1000 mg via INTRAVENOUS

## 2019-07-09 MED ORDER — ACETAMINOPHEN 325 MG PO TABS
ORAL_TABLET | ORAL | Status: AC
  Administered 2019-07-09: 650 mg via ORAL
  Filled 2019-07-09: qty 2

## 2019-07-09 MED ORDER — CIPROFLOXACIN HCL 500 MG PO TABS
ORAL_TABLET | ORAL | Status: AC
  Filled 2019-07-09: qty 1

## 2019-07-09 MED ORDER — PROPOFOL 10 MG/ML IV BOLUS
INTRAVENOUS | Status: AC
  Filled 2019-07-09: qty 60

## 2019-07-09 MED ORDER — LOSARTAN POTASSIUM 25 MG PO TABS
25.0000 mg | ORAL_TABLET | Freq: Every day | ORAL | Status: DC
  Administered 2019-07-09 – 2019-07-10 (×2): 25 mg via ORAL
  Filled 2019-07-09 (×2): qty 1

## 2019-07-09 MED ORDER — DOCUSATE SODIUM 100 MG PO CAPS
100.0000 mg | ORAL_CAPSULE | Freq: Two times a day (BID) | ORAL | Status: DC
  Administered 2019-07-09 – 2019-07-10 (×2): 100 mg via ORAL
  Filled 2019-07-09 (×2): qty 1

## 2019-07-09 MED ORDER — BELLADONNA ALKALOIDS-OPIUM 16.2-60 MG RE SUPP: 1.0000 | Freq: Four times a day (QID) | RECTAL | Status: DC | PRN

## 2019-07-09 MED ORDER — ONDANSETRON HCL 4 MG/2ML IJ SOLN: 4.0000 mg | INTRAMUSCULAR | Status: DC | PRN

## 2019-07-09 MED ORDER — FAMOTIDINE 20 MG PO TABS: 20.0000 mg | ORAL_TABLET | Freq: Once | ORAL | Status: AC

## 2019-07-09 MED ORDER — OXYCODONE-ACETAMINOPHEN 5-325 MG PO TABS: 1.0000 | ORAL_TABLET | ORAL | Status: DC | PRN

## 2019-07-09 MED ORDER — OXYBUTYNIN CHLORIDE 5 MG PO TABS
ORAL_TABLET | ORAL | Status: AC
  Filled 2019-07-09: qty 1

## 2019-07-09 MED ORDER — ACETAMINOPHEN 10 MG/ML IV SOLN
INTRAVENOUS | Status: AC
  Filled 2019-07-09: qty 100

## 2019-07-09 MED ORDER — MORPHINE SULFATE (PF) 2 MG/ML IV SOLN: 2.0000 mg | INTRAVENOUS | Status: DC | PRN

## 2019-07-09 MED ORDER — ONDANSETRON HCL 4 MG/2ML IJ SOLN
INTRAMUSCULAR | Status: AC
  Filled 2019-07-09: qty 2

## 2019-07-09 MED ORDER — PHENYLEPHRINE HCL (PRESSORS) 10 MG/ML IV SOLN
INTRAVENOUS | Status: AC
  Filled 2019-07-09: qty 1

## 2019-07-09 MED ORDER — SODIUM CHLORIDE 0.9 % IV BOLUS: 250.0000 mL | Freq: Once | INTRAVENOUS | Status: DC

## 2019-07-09 MED ORDER — PROPOFOL 10 MG/ML IV BOLUS
INTRAVENOUS | Status: DC | PRN
  Administered 2019-07-09: 130 mg via INTRAVENOUS

## 2019-07-09 MED ORDER — CEFAZOLIN SODIUM-DEXTROSE 2-4 GM/100ML-% IV SOLN
2.0000 g | INTRAVENOUS | Status: AC
  Administered 2019-07-09: 08:00:00 2 g via INTRAVENOUS

## 2019-07-09 MED ORDER — FAMOTIDINE 20 MG PO TABS
ORAL_TABLET | ORAL | Status: AC
  Administered 2019-07-09: 20 mg via ORAL
  Filled 2019-07-09: qty 1

## 2019-07-09 MED ORDER — CIPROFLOXACIN HCL 500 MG PO TABS
500.0000 mg | ORAL_TABLET | Freq: Two times a day (BID) | ORAL | Status: DC
  Administered 2019-07-09 – 2019-07-10 (×3): 500 mg via ORAL
  Filled 2019-07-09 (×2): qty 1

## 2019-07-09 MED ORDER — SUCCINYLCHOLINE CHLORIDE 200 MG/10ML IV SOSY
PREFILLED_SYRINGE | INTRAVENOUS | Status: AC
  Filled 2019-07-09: qty 10

## 2019-07-09 MED ORDER — SUCCINYLCHOLINE CHLORIDE 20 MG/ML IJ SOLN
INTRAMUSCULAR | Status: DC | PRN
  Administered 2019-07-09: 120 mg via INTRAVENOUS

## 2019-07-09 MED ORDER — ROCURONIUM BROMIDE 100 MG/10ML IV SOLN
INTRAVENOUS | Status: DC | PRN
  Administered 2019-07-09: 10 mg via INTRAVENOUS
  Administered 2019-07-09: 40 mg via INTRAVENOUS
  Administered 2019-07-09 (×6): 10 mg via INTRAVENOUS

## 2019-07-09 MED ORDER — DEXAMETHASONE SODIUM PHOSPHATE 10 MG/ML IJ SOLN
INTRAMUSCULAR | Status: DC | PRN
  Administered 2019-07-09: 10 mg via INTRAVENOUS

## 2019-07-09 MED ORDER — ACETAMINOPHEN 325 MG PO TABS
650.0000 mg | ORAL_TABLET | ORAL | Status: DC | PRN
  Administered 2019-07-10: 650 mg via ORAL
  Filled 2019-07-09: qty 2

## 2019-07-09 MED ORDER — FENTANYL CITRATE (PF) 100 MCG/2ML IJ SOLN
INTRAMUSCULAR | Status: DC | PRN
  Administered 2019-07-09 (×3): 50 ug via INTRAVENOUS

## 2019-07-09 MED ORDER — DEXAMETHASONE SODIUM PHOSPHATE 10 MG/ML IJ SOLN
INTRAMUSCULAR | Status: AC
  Filled 2019-07-09: qty 2

## 2019-07-09 MED ORDER — LABETALOL HCL 5 MG/ML IV SOLN
INTRAVENOUS | Status: DC | PRN
Start: 2019-07-09 — End: 2019-07-09
  Administered 2019-07-09 (×3): 5 mg via INTRAVENOUS

## 2019-07-09 MED ORDER — SUGAMMADEX SODIUM 500 MG/5ML IV SOLN
INTRAVENOUS | Status: DC | PRN
  Administered 2019-07-09: 400 mg via INTRAVENOUS

## 2019-07-09 MED ORDER — SUGAMMADEX SODIUM 500 MG/5ML IV SOLN
INTRAVENOUS | Status: AC
  Filled 2019-07-09: qty 5

## 2019-07-09 MED ORDER — DIPHENHYDRAMINE HCL 12.5 MG/5ML PO ELIX
12.5000 mg | ORAL_SOLUTION | Freq: Four times a day (QID) | ORAL | Status: DC | PRN
  Filled 2019-07-09: qty 5

## 2019-07-09 SURGICAL SUPPLY — 35 items
ADAPTER IRRIG TUBE 2 SPIKE SOL (ADAPTER) ×4 IMPLANT
ADPR TBG 2 SPK PMP STRL ASCP (ADAPTER) ×2
BAG DRN LRG CPC RND TRDRP CNTR (MISCELLANEOUS)
BAG DRN RND TRDRP ANRFLXCHMBR (UROLOGICAL SUPPLIES)
BAG URINE DRAIN 2000ML AR STRL (UROLOGICAL SUPPLIES) IMPLANT
BAG URO DRAIN 4000ML (MISCELLANEOUS) IMPLANT
CATH FOL 2WAY LX 20X30 (CATHETERS) IMPLANT
CATH FOL 2WAY LX 22X30 (CATHETERS) IMPLANT
CATH FOLEY 3WAY 30CC 22FR (CATHETERS) IMPLANT
CATH URETL 5X70 OPEN END (CATHETERS) ×2 IMPLANT
CONTAINER COLLECT MORCELLATR (MISCELLANEOUS) ×1 IMPLANT
DRAPE 3/4 80X56 (DRAPES) ×2 IMPLANT
DRAPE UTILITY 15X26 TOWEL STRL (DRAPES) ×1 IMPLANT
ELECT LOOP 22F BIPOLAR SML (ELECTROSURGICAL) ×2
ELECTRODE LOOP 22F BIPOLAR SML (ELECTROSURGICAL) IMPLANT
FILTER OVERFLOW MORCELLATOR (FILTER) ×1 IMPLANT
GLOVE BIO SURGEON STRL SZ 6.5 (GLOVE) ×4 IMPLANT
GOWN STRL REUS W/ TWL LRG LVL3 (GOWN DISPOSABLE) ×2 IMPLANT
GOWN STRL REUS W/TWL LRG LVL3 (GOWN DISPOSABLE) ×4
HOLDER FOLEY CATH W/STRAP (MISCELLANEOUS) ×2 IMPLANT
KIT TURNOVER CYSTO (KITS) ×2 IMPLANT
LASER FIBER 550M SMARTSCOPE (Laser) ×2 IMPLANT
MORCELLATOR COLLECT CONTAINER (MISCELLANEOUS) ×2
MORCELLATOR OVERFLOW FILTER (FILTER) ×2
MORCELLATOR ROTATION 4.75 335 (MISCELLANEOUS) ×2 IMPLANT
PACK CYSTO AR (MISCELLANEOUS) ×2 IMPLANT
SET CYSTO W/LG BORE CLAMP LF (SET/KITS/TRAYS/PACK) IMPLANT
SET IRRIG Y TYPE TUR BLADDER L (SET/KITS/TRAYS/PACK) ×2 IMPLANT
SLEEVE PROTECTION STRL DISP (MISCELLANEOUS) ×4 IMPLANT
SOL .9 NS 3000ML IRR  AL (IV SOLUTION) ×32
SOL .9 NS 3000ML IRR AL (IV SOLUTION) ×16
SOL .9 NS 3000ML IRR UROMATIC (IV SOLUTION) ×4 IMPLANT
SYRINGE IRR TOOMEY STRL 70CC (SYRINGE) ×2 IMPLANT
TUBE PUMP MORCELLATOR PIRANHA (TUBING) ×2 IMPLANT
WATER STERILE IRR 1000ML POUR (IV SOLUTION) ×2 IMPLANT

## 2019-07-09 NOTE — Transfer of Care (Signed)
Immediate Anesthesia Transfer of Care Note  Patient: Larry Vasquez  Procedure(s) Performed: HOLEP-LASER ENUCLEATION OF THE PROSTATE WITH MORCELLATION ([name] Prostate)  Patient Location: PACU  Anesthesia Type:General  Level of Consciousness: awake, drowsy and patient cooperative  Airway & Oxygen Therapy: Patient Spontanous Breathing and Patient connected to face mask oxygen  Post-op Assessment: Report given to RN and Post -op Vital signs reviewed and stable  Post vital signs: Reviewed and stable  Last Vitals:  Vitals Value Taken Time  BP 136/81 07/09/19 1120  Temp    Pulse 60 07/09/19 1129  Resp 11 07/09/19 1129  SpO2 96 % 07/09/19 1129  Vitals shown include unvalidated device data.  Last Pain:  Vitals:   07/09/19 0614  TempSrc: Temporal  PainSc: 0-No pain         Complications: No apparent anesthesia complications

## 2019-07-09 NOTE — Anesthesia Postprocedure Evaluation (Signed)
Anesthesia Post Note  Patient: Larry Vasquez  Procedure(s) Performed: HOLEP-LASER ENUCLEATION OF THE PROSTATE WITH MORCELLATION ([name] Prostate)  Patient location during evaluation: PACU Anesthesia Type: General Level of consciousness: awake and alert Pain management: pain level controlled Vital Signs Assessment: post-procedure vital signs reviewed and stable Respiratory status: spontaneous breathing, nonlabored ventilation, respiratory function stable and patient connected to nasal cannula oxygen Cardiovascular status: blood pressure returned to baseline and stable Postop Assessment: no apparent nausea or vomiting Anesthetic complications: no     Last Vitals:  Vitals:   07/09/19 1213 07/09/19 1220  BP: (!) 85/51 102/60  Pulse: (!) 50 (!) 49  Resp: 12 10  Temp:    SpO2: 93% 95%    Last Pain:  Vitals:   07/09/19 1159  TempSrc:   PainSc: 0-No pain                 Molli Barrows

## 2019-07-09 NOTE — Anesthesia Preprocedure Evaluation (Signed)
Anesthesia Evaluation  Patient identified by MRN, date of birth, ID band Patient awake    Reviewed: Allergy & Precautions, H&P , NPO status , Patient's Chart, lab work & pertinent test results, reviewed documented beta blocker date and time   Airway Mallampati: II  TM Distance: >3 FB Neck ROM: full    Dental  (+) Teeth Intact   Pulmonary neg pulmonary ROS,    Pulmonary exam normal        Cardiovascular Exercise Tolerance: Good hypertension, On Medications +CHF  + dysrhythmias Atrial Fibrillation  Rhythm:regular Rate:Normal     Neuro/Psych negative neurological ROS  negative psych ROS   GI/Hepatic negative GI ROS, Neg liver ROS,   Endo/Other  negative endocrine ROS  Renal/GU negative Renal ROS  negative genitourinary   Musculoskeletal   Abdominal   Peds  Hematology  (+) Blood dyscrasia, anemia ,   Anesthesia Other Findings Past Medical History: No date: Anemia No date: Cataract     Comment:  Left eye No date: CML (chronic myelocytic leukemia) ([facility]) No date: Dysrhythmia 10/17/2012: Leukocytosis, unspecified 10/17/2012: Thrombocytosis ([place]) 06/06/2014: White coat hypertension Past Surgical History: 10/08/2016: ATRIAL FIBRILLATION ABLATION; [name]     Comment:  Procedure: Atrial Fibrillation Ablation;  Surgeon:               Constance Haw, MD;  Location: [place] [facility];               Service: Cardiovascular;  Laterality: [name]; 06/24/2016: CARDIOVERSION; [name]     Comment:  Procedure: CARDIOVERSION;  Surgeon: [name], MD;                Location: [place];  Service: Cardiovascular;                Laterality: [name]; 01/01/2013: CATARACT EXTRACTION; Left 2012: EYE SURGERY     Comment:  Detached Retina No date: HERNIA REPAIR 2006: JOINT REPLACEMENT     Comment:  Total Knee Left No date: TONSILLECTOMY   Reproductive/Obstetrics negative OB ROS                              Anesthesia Physical Anesthesia Plan  ASA: III  Anesthesia Plan: General ETT   Post-op Pain Management:    Induction:   PONV Risk Score and Plan:   Airway Management Planned:   Additional Equipment:   Intra-op Plan:   Post-operative Plan:   Informed Consent: I have reviewed the patients History and Physical, chart, labs and discussed the procedure including the risks, benefits and alternatives for the proposed anesthesia with the patient or authorized representative who has indicated his/her understanding and acceptance.     Dental Advisory Given  Plan Discussed with: CRNA  Anesthesia Plan Comments:         Anesthesia Quick Evaluation

## 2019-07-09 NOTE — Anesthesia Procedure Notes (Addendum)
Procedure Name: Intubation Performed by: Kelton Pillar, CRNA Pre-anesthesia Checklist: Patient identified, Emergency Drugs available, Suction available and Patient being monitored Patient Re-evaluated:Patient Re-evaluated prior to induction Oxygen Delivery Method: Circle system utilized Preoxygenation: Pre-oxygenation with 100% oxygen Induction Type: IV induction Ventilation: Mask ventilation without difficulty Laryngoscope Size: McGraph and 3 Grade View: Grade I Tube size: 7.0 mm Number of attempts: 1 Airway Equipment and Method: Stylet and Video-laryngoscopy Placement Confirmation: ETT inserted through vocal cords under direct vision,  positive ETCO2,  CO2 detector and breath sounds checked- equal and bilateral Secured at: 22 cm Tube secured with: Tape Dental Injury: Teeth and Oropharynx as per pre-operative assessment  Comments: Pt with limited neck mobility, head/neck remain in strict neutral position for intubation and will be maintained throughout procedure -TFH

## 2019-07-09 NOTE — Interval H&P Note (Signed)
RRR CTA B  All questions answered.

## 2019-07-09 NOTE — Op Note (Signed)
Date of procedure: 07/09/19  Preoperative diagnosis:  1. BPH with BOO 2. Urinary retention  Postoperative diagnosis:  1. same   Procedure: 1. HoLEP with morcellation  Surgeon: Hollice Espy, MD  Anesthesia: General  Complications: None  Intraoperative findings:   EBL: 100+ cc  Specimens: prostate chips  Drains: 22 3 day hematuria foley with 75 cc balloon  Indication: Larry Vasquez is a 69 y.o. patient with refractory urinary retention with massive prostamegaly including a largely median lobe.  After reviewing the management options for treatment, he elected to proceed with the above surgical procedure(s). We have discussed the potential benefits and risks of the procedure, side effects of the proposed treatment, the likelihood of the patient achieving the goals of the procedure, and any potential problems that might occur during the procedure or recuperation. Informed consent has been obtained.  Description of procedure:  The patient was taken to the operating room and general anesthesia was induced.  The patient was placed in the dorsal lithotomy position, prepped and draped in the usual sterile fashion, and preoperative antibiotics were administered. A preoperative time-out was performed.     A 26 French resectoscope sheath using a blunt angled obturator was introduced without difficulty into the bladder.  The bladder was carefully inspected and noted to be moderately trabeculated.  There is an elevated bladder neck with a very small intravesical component.  The trigone was relatively obstructed by a large median lobe and difficult to visualize.  The prostatic fossa had significant trilobar coaptation with greater than 5 cm prostatic length.  A 550 m laser fiber was then brought in and using settings of 0.9 J's and 53 Hz, 2 incisions were created at the 5:00 and 7:00 positions of the bladder neck on either side of the median lobe down to the level of the bladder neck/capsular  fibers.  The incision was carried down caudally meeting in the midline just above the verumontanum.  The median lobe was then enucleated from a caudal to cranial direction cleaving the adenoma off the underlying capsule rolling it towards the bladder neck and ultimately cleaving the mucosa to free the median lobe into the bladder.  At this point, the trigone was more easily visible and the UOs were identified which had almost ureterocele versus stadium-year-old type appearance bilaterally.  This was symmetric.   Next, a semilunar incision was created at the prostatic apex on the left side again freeing up the adenoma from the underlying capsule.  Care was taken to avoid any resection past the verumontanum.  This incision was carried around laterally and cranially towards the bladder neck.  Ultimately, I was able to complete the anterior commissure mucosa and the adenoma into the bladder creating a widely patent prostatic fossa.     Next, the same similar incision was created at the right prostatic apex.    The same exact technique for mention was used to enucleate the right lateral lobe as well. Once this was completed and cleared from the bladder neck, the prostatic fossa was noted to be widely patent.  Hemostasis was achieved using hemostatic fiber setting but this was suboptimal.  There was diffuse vascularity and oozing throughout the procedure thus a bipolar loop was brought in to try to dry up the fossa especially around the bladder neck.  Bilateral UOs were visualized and free of any injury.  Finally, the 51 French resectoscope was exchanged for nephroscope and using the Piranha handpiece morcellator, the bladder was distended in each of the  prostate chips were evacuated.    Had to go back and forth between the bipolar loop and the Piranha for hemostatic purposes.  There was a small mucosal abrasion on the posterior bladder wall presumably from the morcellator but appeared to be quite superficial and  there was no concern for perforation.  This was fulgurated for hemostasis as well. This point time, there were no residual fibers appreciated in the bladder.  Hemostasis was adequate.  10 mg of IV Lasix was administered to help with postoperative diuresis.  A 22 French 3-way Foley catheter was then inserted over a catheter guide with 75 cc in the balloon and CBI was initiated.  The catheter irrigated easily and well.  Patient was then clean and dry, repositioned supine position, reversed from anesthesia, taken to PACU in stable condition.   Plan: Given the oozing throughout the case, we will keep the patient overnight for CBI.  Findings were discussed with the patient's wife.  Hollice Espy, M.D.

## 2019-07-10 DIAGNOSIS — N401 Enlarged prostate with lower urinary tract symptoms: Secondary | ICD-10-CM | POA: Diagnosis not present

## 2019-07-10 LAB — CBC
HCT: 26.4 % — ABNORMAL LOW (ref 39.0–52.0)
Hemoglobin: 8.9 g/dL — ABNORMAL LOW (ref 13.0–17.0)
MCH: 31.4 pg (ref 26.0–34.0)
MCHC: 33.7 g/dL (ref 30.0–36.0)
MCV: 93.3 fL (ref 80.0–100.0)
Platelets: 194 10*3/uL (ref 150–400)
RBC: 2.83 MIL/uL — ABNORMAL LOW (ref 4.22–5.81)
RDW: 13.2 % (ref 11.5–15.5)
WBC: 6.3 10*3/uL (ref 4.0–10.5)
nRBC: 0 % (ref 0.0–0.2)

## 2019-07-10 LAB — BASIC METABOLIC PANEL
Anion gap: 5 (ref 5–15)
BUN: 14 mg/dL (ref 8–23)
CO2: 24 mmol/L (ref 22–32)
Calcium: 7.9 mg/dL — ABNORMAL LOW (ref 8.9–10.3)
Chloride: 107 mmol/L (ref 98–111)
Creatinine, Ser: 1.01 mg/dL (ref 0.61–1.24)
GFR calc Af Amer: >60 mL/min (ref >60)
GFR calc non Af Amer: >60 mL/min (ref >60)
Glucose, Bld: 110 mg/dL — ABNORMAL HIGH (ref 70–99)
Potassium: 3.8 mmol/L (ref 3.5–5.1)
Sodium: 136 mmol/L (ref 135–145)

## 2019-07-10 LAB — SURGICAL PATHOLOGY

## 2019-07-10 MED ORDER — HYDROCODONE-ACETAMINOPHEN 5-325 MG PO TABS: 1.0000 | ORAL_TABLET | Freq: Four times a day (QID) | ORAL | 0 refills | Status: DC | PRN

## 2019-07-10 NOTE — Discharge Summary (Signed)
Date of admission: 07/09/2019  Date of discharge: 07/10/2019  Admission diagnosis: BPH with urinary retention  Discharge diagnosis: same as above  Secondary diagnoses:  Patient Active Problem List   Diagnosis Date Noted  . BPH with urinary obstruction 07/09/2019  . CML (chronic myelocytic leukemia) ([place])   . Cataract   . Hematoma 10/11/2016  . Status post catheter ablation of atrial flutter 10/09/2016  . Status post catheter ablation of atrial fibrillation 10/09/2016  . AF (atrial fibrillation) ([place]) 10/08/2016  . CHF (congestive heart failure) ([place]) 05/25/2016  . Atrial fibrillation, new onset ([place]) 05/25/2016  . Essential (primary) hypertension 05/25/2016  . Atrial fibrillation ([place]) 05/25/2016  . Atrial fibrillation with RVR ([place])   . Prostate cancer screening 10/22/2015  . Routine health maintenance 10/22/2015  . BPH with elevated PSA 10/16/2015  . White coat syndrome with hypertension 06/06/2014  . Spells 11/28/2013  . Leukopenia due to antineoplastic chemotherapy ([place]) 11/28/2013  . Pancytopenia due to antineoplastic chemotherapy ([place]) 08/09/2013  . 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[place]) 01/30/2013  . Thrombocytosis ([place]) 10/17/2012    History and Physical: For full details, please see admission history and physical. Briefly, Larry Vasquez is a 69 y.o. year old patient with urinary retention s/p HoLEP.   Hospital Course: Patient tolerated the procedure well.  He was then transferred to the floor after an uneventful PACU stay.  His hospital course was uncomplicated, admitted for CBI overnight.  On POD#1 he had met discharge criteria: was eating a regular diet, was up and ambulating independently,  pain was well controlled, foley very light pink off CBI, and was ready to for discharge.   Laboratory values:  Recent Labs    07/10/19 0513  WBC 6.3  HGB 8.9*  HCT 26.4*   Recent Labs    07/10/19 0513  NA 136  K 3.8  CL 107  CO2 24  GLUCOSE 110*  BUN 14  CREATININE  1.01  CALCIUM 7.9*   No results for input(s): LABPT, INR in the last 72 hours. No results for input(s): LABURIN in the last 72 hours. Results for orders placed or performed during the hospital encounter of 07/05/19  SARS CORONAVIRUS 2 (TAT 6-24 HRS) Nasopharyngeal Nasopharyngeal Swab     Status: None   Collection Time: 07/05/19 10:24 AM   Specimen: Nasopharyngeal Swab  Result Value Ref Range Status   SARS Coronavirus 2 NEGATIVE NEGATIVE Final    Comment: (NOTE) SARS-CoV-2 target nucleic acids are NOT DETECTED. The SARS-CoV-2 RNA is generally detectable in upper and lower respiratory specimens during the acute phase of infection. Negative results do not preclude SARS-CoV-2 infection, do not rule out co-infections with other pathogens, and should not be used as the sole basis for treatment or other patient management decisions. Negative results must be combined with clinical observations, patient history, and epidemiological information. The expected result is Negative. Fact Sheet for Patients: SugarRoll.be Fact Sheet for Healthcare Providers: https://www.woods-mathews.com/ This test is not yet approved or cleared by the [place] FDA and  has been authorized for detection and/or diagnosis of SARS-CoV-2 by FDA under an Emergency Use Authorization (EUA). This EUA will remain  in effect (meaning this test can be used) for the duration of the COVID-19 declaration under Section 56 4(b)(1) of the Act, 21 U.S.C. section 360bbb-3(b)(1), unless the authorization is terminated or revoked sooner. Performed at [facility], [place] [address], [place], [place] [address]     Disposition: Home  Discharge instruction: Foley teaching  Discharge medications:  Allergies as of 07/10/2019      Reactions   Corticosteroids Other (See Comments)   Muscle stiffness      Medication List    TAKE these medications   acetaminophen 500 MG  tablet Commonly known as: TYLENOL Take 1,000 mg by mouth every 6 (six) hours as needed for moderate pain or headache.   APPLE CIDER VINEGAR PO Take 15 mLs by mouth daily.   ciprofloxacin 500 MG tablet Commonly known as: CIPRO Take 1 tablet (500 mg total) by mouth every 12 (twelve) hours. Begin medication on 07/06/2019   Co Q-10 100 MG Caps Take 100 mg by mouth daily.   dasatinib 50 MG tablet Commonly known as: Sprycel Take 1 tablet (50 mg total) by mouth daily.   DIGESTIVE ENZYME PO Take 1 tablet by mouth as needed.   HYDROcodone-acetaminophen 5-325 MG tablet Commonly known as: NORCO/VICODIN Take 1-2 tablets by mouth every 6 (six) hours as needed for moderate pain.   losartan 25 MG tablet Commonly known as: COZAAR TAKE 1 TABLET BY MOUTH EVERY DAY   metoprolol 200 MG 24 hr tablet Commonly known as: TOPROL-XL Take 1 tablet (200 mg total) by mouth daily.   MILK THISTLE PO Take 1 capsule by mouth 3 (three) times a week.   multivitamin with minerals Tabs tablet Take 1 tablet by mouth daily.   OMEGA 3 PO Take 1 capsule by mouth daily.   OVER THE COUNTER MEDICATION Take 1 packet by mouth at bedtime. NATURAL CALM MAGNESIUM POWDER-MIXES IN DRINK   tamsulosin 0.4 MG Caps capsule Commonly known as: FLOMAX Take 1 capsule (0.4 mg total) by mouth daily.   TURMERIC CURCUMIN PO Take 1-2 capsules by mouth daily.   VITAMIN D PO Take 5,000 Units by mouth daily.   ZINC PO Take 1 tablet by mouth 3 (three) times a week.       Followup:  Follow-up Information    Hollice Espy, MD.   Specialty: Urology Why: as scheduled Contact information: [address] [address] [place] [address] [phone]

## 2019-07-10 NOTE — Discharge Instructions (Signed)
Indwelling Urinary Catheter Care, Adult An indwelling urinary catheter is a thin tube that is put into your bladder. The tube helps to drain pee (urine) out of your body. The tube goes in through your urethra. Your urethra is where pee comes out of your body. Your pee will come out through the catheter, then it will go into a bag (drainage bag). Take good care of your catheter so it will work well. How to wear your catheter and bag Supplies needed  Sticky tape (adhesive tape) or a leg strap.  Alcohol wipe or soap and water (if you use tape).  A clean towel (if you use tape).  Large overnight bag.  Smaller bag (leg bag). Wearing your catheter Attach your catheter to your leg with tape or a leg strap.  Make sure the catheter is not pulled tight.  If a leg strap gets wet, take it off and put on a dry strap.  If you use tape to hold the bag on your leg: 1. Use an alcohol wipe or soap and water to wash your skin where the tape made it sticky before. 2. Use a clean towel to pat-dry that skin. 3. Use new tape to make the bag stay on your leg. Wearing your bags You should have been given a large overnight bag.  You may wear the overnight bag in the day or night.  Always have the overnight bag lower than your bladder.  Do not let the bag touch the floor.  Before you go to sleep, put a clean plastic bag in a wastebasket. Then hang the overnight bag inside the wastebasket. You should also have a smaller leg bag that fits under your clothes.  Always wear the leg bag below your knee.  Do not wear your leg bag at night. How to care for your skin and catheter Supplies needed  A clean washcloth.  Water and mild soap.  A clean towel. Caring for your skin and catheter      Clean the skin around your catheter every day: 1. Wash your hands with soap and water. 2. Wet a clean washcloth in warm water and mild soap. 3. Clean the skin around your urethra.  If you are  male:  Gently spread the folds of skin around your vagina (labia).  With the washcloth in your other hand, wipe the inner side of your labia on each side. Wipe from front to back.  If you are male:  Pull back any skin that covers the end of your penis (foreskin).  With the washcloth in your other hand, wipe your penis in small circles. Start wiping at the tip of your penis, then move away from the catheter.  Move the foreskin back in place, if needed. 4. With your free hand, hold the catheter close to where it goes into your body.  Keep holding the catheter during cleaning so it does not get pulled out. 5. With the washcloth in your other hand, clean the catheter.  Only wipe downward on the catheter.  Do not wipe upward toward your body. Doing this may push germs into your urethra and cause infection. 6. Use a clean towel to pat-dry the catheter and the skin around it. Make sure to wipe off all soap. 7. Wash your hands with soap and water.  Shower every day. Do not take baths.  Do not use cream, ointment, or lotion on the area where the catheter goes into your body, unless your doctor tells you   to.  Do not use powders, sprays, or lotions on your genital area.  Check your skin around the catheter every day for signs of infection. Check for: ? Redness, swelling, or pain. ? Fluid or blood. ? Warmth. ? Pus or a bad smell. How to empty the bag Supplies needed  Rubbing alcohol.  Gauze pad or cotton ball.  Tape or a leg strap. Emptying the bag Pour the pee out of your bag when it is ?- full, or at least 2-3 times a day. Do this for your overnight bag and your leg bag. 1. Wash your hands with soap and water. 2. Separate (detach) the bag from your leg. 3. Hold the bag over the toilet or a clean pail. Keep the bag lower than your hips and bladder. This is so the pee (urine) does not go back into the tube. 4. Open the pour spout. It is at the bottom of the bag. 5. Empty the  pee into the toilet or pail. Do not let the pour spout touch any surface. 6. Put rubbing alcohol on a gauze pad or cotton ball. 7. Use the gauze pad or cotton ball to clean the pour spout. 8. Close the pour spout. 9. Attach the bag to your leg with tape or a leg strap. 10. Wash your hands with soap and water. Follow instructions for cleaning the drainage bag:  From the product maker.  As told by your doctor. How to change the bag Supplies needed  Alcohol wipes.  A clean bag.  Tape or a leg strap. Changing the bag Replace your bag when it starts to leak, smell bad, or look dirty. 1. Wash your hands with soap and water. 2. Separate the dirty bag from your leg. 3. Pinch the catheter with your fingers so that pee does not spill out. 4. Separate the catheter tube from the bag tube where these tubes connect (at the connection valve). Do not let the tubes touch any surface. 5. Clean the end of the catheter tube with an alcohol wipe. Use a different alcohol wipe to clean the end of the bag tube. 6. Connect the catheter tube to the tube of the clean bag. 7. Attach the clean bag to your leg with tape or a leg strap. Do not make the bag tight on your leg. 8. Wash your hands with soap and water. General rules   Never pull on your catheter. Never try to take it out. Doing that can hurt you.  Always wash your hands before and after you touch your catheter or bag. Use a mild, fragrance-free soap. If you do not have soap and water, use hand sanitizer.  Always make sure there are no twists or bends (kinks) in the catheter tube.  Always make sure there are no leaks in the catheter or bag.  Drink enough fluid to keep your pee pale yellow.  Do not take baths, swim, or use a hot tub.  If you are male, wipe from front to back after you poop (have a bowel movement). Contact a doctor if:  Your pee is cloudy.  Your pee smells worse than usual.  Your catheter gets clogged.  Your catheter  leaks.  Your bladder feels full. Get help right away if:  You have redness, swelling, or pain where the catheter goes into your body.  You have fluid, blood, pus, or a bad smell coming from the area where the catheter goes into your body.  Your skin feels warm where   the catheter goes into your body.  You have a fever.  You have pain in your: ? Belly (abdomen). ? Legs. ? Lower back. ? Bladder.  You see blood in the catheter.  Your pee is pink or red.  You feel sick to your stomach (nauseous).  You throw up (vomit).  You have chills.  Your pee is not draining into the bag.  Your catheter gets pulled out. Summary  An indwelling urinary catheter is a thin tube that is placed into the bladder to help drain pee (urine) out of the body.  The catheter is placed into the part of the body that drains pee from the bladder (urethra).  Taking good care of your catheter will keep it working properly and help prevent problems.  Always wash your hands before and after touching your catheter or bag.  Never pull on your catheter or try to take it out. This information is not intended to replace advice given to you by your health care provider. Make sure you discuss any questions you have with your health care provider. Document Revised: 06/16/2018 Document Reviewed: 10/08/2016 Elsevier Patient Education  [place].   Holmium Laser Enucleation of the Prostate (HoLEP)  HoLEP is a treatment for men with benign prostatic hyperplasia (BPH). The laser surgery removed blockages of urine flow, and is done without any incisions on the body.     What is HoLEP?  HoLEP is a type of laser surgery used to treat obstruction (blockage) of urine flow as a result of benign prostatic hyperplasia (BPH). In men with BPH, the prostate gland is not cancerous, but has become enlarged. An enlarged prostate can result in a number of urinary tract symptoms such as weak urinary stream, difficulty  in starting urination, inability to urinate, frequent urination, or getting up at night to urinate.  HoLEP was developed in the 1990's as a more effective and less expensive surgical option for BPH, compared to other surgical options such as laser vaporization(PVP/greenlight laser), transurethral resection of the prostate(TURP), and open simple prostatectomy.   What happens during a HoLEP?  HoLEP requires general anesthesia ("asleep" throughout the procedure).   An antibiotic is given to reduce the risk of infection  A surgical instrument called a resectoscope is inserted through the urethra (the tube that carries urine from the bladder). The resectoscope has a camera that allows the surgeon to view the internal structure of the prostate gland, and to see where the incisions are being made during surgery.  The laser is inserted into the resectoscope and is used to enucleate (free up) the enlarged prostate tissue from the capsule (outer shell) and then to seal up any blood vessels. The tissue that has been removed is pushed back into the bladder.  A morcellator is placed through the resectoscope, and is used to suction out the prostate tissue that has been pushed into the bladder.  When the prostate tissue has been removed, the resectoscope is removed, and a foley catheter is placed to allow healing and drain the urine from the bladder.     What happens after a HoLEP?  More than 90% of patients go home the same day a few hours after surgery. Less than 10% will be admitted to the hospital overnight for observation to monitor the urine, or if they have other medical problems.  Fluid is flushed through the catheter for about 1 hour after surgery to clear any blood from the urine. It is normal to have some  blood in the urine after surgery. The need for blood transfusion is extremely rare.  Eating and drinking are permitted after the procedure once the patient has fully awakened from  anesthesia.  The catheter is usually removed 2-3 days after surgery- the patient will come to clinic to have the catheter removed and make sure they can urinate on their own.  It is very important to drink lots of fluids after surgery for one week to keep the bladder flushed.  At first, there may be some burning with urination, but this typically improved within a few hours to days. Most patients do not have a significant amount of pain, and narcotic pain medications are rarely needed.  Symptoms of urinary frequency, urgency, and even leakage are NORMAL for the first few weeks after surgery as the bladder adjusts after having to work hard against blockage from the prostate for many years. This will improve, but can sometimes take several months.  The use of pelvic floor exercises (Kegel exercises) can help improve problems with urinary incontinence.   After catheter removal, patients will be seen at 6 weeks and 6 months for symptom check  No heavy lifting for at least 2-3 weeks after surgery, however patients can walk and do light activities the first day after surgery. Return to work time depends on occupation.    What are the advantages of HoLEP?  HoLEP has been studied in many different parts of the world and has been shown to be a safe and effective procedure. Although there are many types of BPH surgeries available, HoLEP offers a unique advantage in being able to remove a large amount of tissue without any incisions on the body, even in very large prostates, while decreasing the risk of bleeding and providing tissue for pathology (to look for cancer). This decreases the need for blood transfusions during surgery, minimizes hospital stay, and reduces the risk of needing repeat treatment.  What are the side effects of HoLEP?  Temporary burning and bleeding during urination. Some blood may be seen in the urine for weeks after surgery and is part of the healing process.  Urinary  incontinence (inability to control urine flow) is expected in all patients immediately after surgery and they should wear pads for the first few days/weeks. This typically improves over the course of several weeks. Performing Kegel exercises can help decrease leakage from stress maneuvers such as coughing, sneezing, or lifting. The rate of long term leakage is very low. Patients may also have leakage with urgency and this may be treated with medication. The risk of urge incontinence can be dependent on several factors including age, prostate size, symptoms, and other medical problems.  Retrograde ejaculation or "backwards ejaculation." In 75% of cases, the patient will not see any fluid during ejaculation after surgery.  Erectile function is generally not significantly affected.   What are the risks of HoLEP?  Injury to the urethra or development of scar tissue at a later date  Injury to the capsule of the prostate (typically treated with longer catheterization).  Injury to the bladder or ureteral orifices (where the urine from the kidney drains out)  Infection of the bladder, testes, or kidneys  Return of urinary obstruction at a later date requiring another operation (<2%)  Need for blood transfusion or re-operation due to bleeding  Failure to relieve all symptoms and/or need for prolonged catheterization after surgery  5-15% of patients are found to have previously undiagnosed prostate cancer in their specimen. Prostate cancer  can be treated after HoLEP.  Standard risks of anesthesia including blood clots, heart attacks, etc  When should I call my doctor?  Fever over 101.3 degrees  Inability to urinate, or large blood clots in the urine

## 2019-07-10 NOTE — Progress Notes (Signed)
Urology Consult Follow Up  Subjective: POD # 1 HoLEP.  Admitted overnight for CBI.  No issues overnight.  Patient resting comfortably.  Urine is light pink on slow drip.    Anti-infectives: Anti-infectives (From admission, onward)   Start     Dose/Rate Route Frequency Ordered Stop   07/09/19 1300  ciprofloxacin (CIPRO) tablet 500 mg    Note to Pharmacy: Begin medication on 07/06/2019     500 mg Oral Every 12 hours 07/09/19 1258     07/09/19 1259  ciprofloxacin (CIPRO) 500 MG tablet    Note to Pharmacy: Ronnell Freshwater   : cabinet override      07/09/19 1259 07/10/19 0114   07/09/19 0618  ceFAZolin (ANCEF) 2-4 GM/100ML-% IVPB    Note to Pharmacy: Trudie Reed   : cabinet override      07/09/19 0618 07/09/19 0749   07/09/19 0609  ceFAZolin (ANCEF) IVPB 2g/100 mL premix     2 g 200 mL/hr over 30 Minutes Intravenous 30 min pre-op 07/09/19 0609 07/09/19 0757      Current Facility-Administered Medications  Medication Dose Route Frequency Provider Last Rate Last Admin  . 0.9 %  sodium chloride infusion   Intravenous Continuous Hollice Espy, MD 100 mL/hr at 07/10/19 0600 Rate Verify at 07/10/19 0600  . acetaminophen (TYLENOL) tablet 650 mg  650 mg Oral Q4H PRN Hollice Espy, MD   650 mg at 07/10/19 0207  . Chlorhexidine Gluconate Cloth 2 % PADS 6 each  6 each Topical Daily Hollice Espy, MD      . ciprofloxacin (CIPRO) tablet 500 mg  500 mg Oral Q12H Hollice Espy, MD   500 mg at 07/09/19 2140  . diphenhydrAMINE (BENADRYL) injection 12.5 mg  12.5 mg Intravenous Q6H PRN Hollice Espy, MD       Or  . diphenhydrAMINE (BENADRYL) 12.5 MG/5ML elixir 12.5 mg  12.5 mg Oral Q6H PRN Hollice Espy, MD      . docusate sodium (COLACE) capsule 100 mg  100 mg Oral BID Hollice Espy, MD   100 mg at 07/09/19 2140  . losartan (COZAAR) tablet 25 mg  25 mg Oral Daily Hollice Espy, MD   25 mg at 07/09/19 1810  . metoprolol succinate (TOPROL-XL) 24 hr tablet 200 mg  200 mg Oral Daily Hollice Espy, MD      . morphine 2 MG/ML injection 2-4 mg  2-4 mg Intravenous Q2H PRN Hollice Espy, MD      . ondansetron [facility]) injection 4 mg  4 mg Intravenous Q4H PRN Hollice Espy, MD      . opium-belladonna (B&O) suppository 16.2-60mg   1 suppository Rectal Q6H PRN Hollice Espy, MD      . oxybutynin (DITROPAN) tablet 5 mg  5 mg Oral Q8H PRN Hollice Espy, MD   5 mg at 07/09/19 1353  . oxyCODONE-acetaminophen (PERCOCET/ROXICET) 5-325 MG per tablet 1-2 tablet  1-2 tablet Oral Q4H PRN Hollice Espy, MD      . sodium chloride irrigation 0.9 % 3,000 mL  3,000 mL Irrigation Continuous Hollice Espy, MD   3,000 mL at 07/09/19 1633  . tamsulosin (FLOMAX) capsule 0.4 mg  0.4 mg Oral Daily Hollice Espy, MD         Objective: Vital signs in last 24 hours: Temp:  [96.9 F (36.1 C)-98.8 F (37.1 C)] 98 F (36.7 C) (05/04 0611) Pulse Rate:  [49-72] 62 (05/04 0611) Resp:  [7-18] 16 (05/04 0611) BP: (85-136)/(49-81) 122/65 (05/04 0611) SpO2:  [93 %-100 %]  95 % (05/04 0611)  Intake/Output from previous day: 05/03 0701 - 05/04 0700 In: 80910.2 [P.O.:480; I.V.:3430.2; IV Piggyback:250] Out: 15010 [Urine:15000; Blood:10] Intake/Output this shift: Total I/O In: 3000 [Other:3000] Out: -    Physical Exam Constitutional:  Well nourished. Alert and oriented, No acute distress.  Younger appearing than stated age.   HEENT: [place] AT, moist mucus membranes.  Trachea midline. Cardiovascular: No clubbing, cyanosis, or edema. Respiratory: Normal respiratory effort, no increased work of breathing. GI: Abdomen is soft, non tender, non distended, no abdominal masses. GU: No CVA tenderness.  No bladder fullness or masses.   Neurologic: Grossly intact, no focal deficits, moving all 4 extremities. Psychiatric: Normal mood and affect.  Lab Results:  Recent Labs    07/10/19 0513  WBC 6.3  HGB 8.9*  HCT 26.4*  PLT 194   BMET Recent Labs    07/10/19 0513  NA 136  K 3.8  CL 107  CO2 24   GLUCOSE 110*  BUN 14  CREATININE 1.01  CALCIUM 7.9*   PT/INR No results for input(s): LABPROT, INR in the last 72 hours. ABG No results for input(s): PHART, HCO3 in the last 72 hours.  Invalid input(s): PCO2, PO2  Studies/Results: No results found.   Assessment: s/p Procedure(s): HOLEP-LASER ENUCLEATION OF THE PROSTATE WITH MORCELLATION  Plan: Patient admitted overnight for CBI   CBI has been turned off at this time Will check on patient this afternoon and discharge if appropriate       LOS: 0 days    [facility] 07/10/2019

## 2019-07-12 ENCOUNTER — Telehealth: Payer: Self-pay | Admitting: *Deleted

## 2019-07-12 NOTE — Telephone Encounter (Addendum)
Patient informed, voiced understanding.   ----- Message from Hollice Espy, MD sent at 07/10/2019  4:56 PM EDT ----- Pathology benign, great news.

## 2019-07-13 ENCOUNTER — Encounter: Payer: Self-pay | Admitting: Urology

## 2019-07-13 ENCOUNTER — Other Ambulatory Visit: Payer: Self-pay

## 2019-07-13 ENCOUNTER — Ambulatory Visit (INDEPENDENT_AMBULATORY_CARE_PROVIDER_SITE_OTHER): Payer: 59 | Admitting: Urology

## 2019-07-13 VITALS — BP 162/76 | HR 76 | Ht 74.0 in | Wt 213.0 lb

## 2019-07-13 DIAGNOSIS — Z9079 Acquired absence of other genital organ(s): Secondary | ICD-10-CM

## 2019-07-13 NOTE — Progress Notes (Signed)
Catheter Removal Status Post HoLEP 07/09/2019  Patient is present today for a catheter removal.  75 ml of water was drained from the balloon. A 22 FR 3 way hematuria cath was removed from the bladder no complications were noted . Patient tolerated well.  Performed by: Luella Cook, PA-S and Zara Council, PA-C  Follow up/ Additional notes: Follow up with Dr. Erlene Quan in 6 weeks  Patient instructed to return by 3 pm if having difficulty with urination.

## 2019-07-17 ENCOUNTER — Ambulatory Visit: Payer: 59 | Admitting: Urology

## 2019-07-17 ENCOUNTER — Ambulatory Visit: Payer: 59 | Admitting: Physician Assistant

## 2019-07-18 ENCOUNTER — Telehealth: Payer: Self-pay | Admitting: *Deleted

## 2019-07-18 NOTE — Telephone Encounter (Signed)
Pt calling stating that he had a HOLEP done on 07/09/2019 and since having the cath removed he's having problems with controlling himself and has to wear depends and change often. Pt is asking Dr. Erlene Quan to write a letter dismissing him from jury duty on 08/07/2019. If he's not better because he would have to change often if he has to sit in the jury room for hours. Please advise

## 2019-07-18 NOTE — Telephone Encounter (Signed)
Spoke with patient and advised letter ready for pick-up, pt will come by to pick up the letter.

## 2019-07-18 NOTE — Telephone Encounter (Signed)
Thanks fine.  Please provide with letter.  Also review pelvic floor exercises.  Incontinence should improve with time as per preop discussion.  Hollice Espy, MD

## 2019-08-04 NOTE — Progress Notes (Signed)
This encounter was created in error - please disregard.

## 2019-08-13 NOTE — Progress Notes (Signed)
08/14/19 1:48 PM   Larry Vasquez Apr 22, 1950 [id number]  Referring provider: [name], MD [place],  [place] [address] Chief Complaint  Patient presents with   Post-op Follow-up    HPI: Larry Vasquez is a 69 y.o. M who returns today for f/u s/p HoLEP.    He has a past hx of elevated PSA in the 5 range. His most recent PSA 4.2 down from previous range.   No previous hx of prostate bx.   He has a remote hx of urinary retention few years ago and was in retention at the time of the procedure.  He requested outlet procedure here since HoLEP not performed at [facility] in [place].   He underwent holmium laser enucleation of the prostate on 07/09/19. Surgical pathology reviewed, 47 g resection, benign tissue.  Estimated prostate volume preoperatively about 130 cc.   He reports of frequency and improvement in incontinence. He does not leak when he laughs, coughs, and sneezes. He states of a little drips of urine exacerbated by physical movement. He notes of little blood in his urine this morning. Denies burning w/ urination.    PVR 47 mL.   IPSS    Row Name 08/14/19 0900         International Prostate Symptom Score   How often have you had the sensation of not emptying your bladder?  Less than 1 in 5     How often have you had to urinate less than every two hours?  About half the time     How often have you found you stopped and started again several times when you urinated?  Less than half the time     How often have you found it difficult to postpone urination?  Less than 1 in 5 times     How often have you had a weak urinary stream?  Less than 1 in 5 times     How often have you had to strain to start urination?  Not at All     How many times did you typically get up at night to urinate?  1 Time     Total IPSS Score  9       Quality of Life due to urinary symptoms   If you were to spend the rest of your life with your urinary condition  just the way it is now how would you feel about that?  Mostly Satisfied        Score:  1-7 Mild 8-19 Moderate 20-35 Severe   PMH: Past Medical History:  Diagnosis Date   Anemia    Cataract    Left eye   CML (chronic myelocytic leukemia) (HCC)    Dysrhythmia    Leukocytosis, unspecified 10/17/2012   Thrombocytosis ([place]) 10/17/2012   White coat hypertension 06/06/2014    Surgical History: Past Surgical History:  Procedure Laterality Date   ATRIAL FIBRILLATION ABLATION [name] 10/08/2016   Procedure: Atrial Fibrillation Ablation;  Surgeon: Constance Haw, MD;  Location: [place] [facility];  Service: Cardiovascular;  Laterality: [name];   CARDIOVERSION [name] 06/24/2016   Procedure: CARDIOVERSION;  Surgeon: [name], MD;  Location: [place];  Service: Cardiovascular;  Laterality: [name];   CATARACT EXTRACTION Left 01/01/2013   EYE SURGERY  2012   Detached Retina   HERNIA REPAIR     HOLEP-LASER ENUCLEATION OF THE PROSTATE WITH MORCELLATION [name] 07/09/2019   Procedure: HOLEP-LASER ENUCLEATION OF THE PROSTATE WITH MORCELLATION;  Surgeon:  Hollice Espy, MD;  Location: [facility];  Service: Urology;  Laterality: [name];   JOINT REPLACEMENT  2006   Total Knee Left   TONSILLECTOMY      Home Medications:  Allergies as of 08/14/2019      Reactions   Corticosteroids Other (See Comments)   Muscle stiffness      Medication List       Accurate as of August 14, 2019  1:48 PM. If you have any questions, ask your nurse or doctor.        acetaminophen 500 MG tablet Commonly known as: TYLENOL Take 1,000 mg by mouth every 6 (six) hours as needed for moderate pain or headache.   APPLE CIDER VINEGAR PO Take 15 mLs by mouth daily.   Co Q-10 100 MG Caps Take 100 mg by mouth daily.   dasatinib 50 MG tablet Commonly known as: Sprycel Take 1 tablet (50 mg total) by mouth daily.   DIGESTIVE ENZYME PO Take 1 tablet by mouth as needed.   HYDROcodone-acetaminophen 5-325 MG  tablet Commonly known as: NORCO/VICODIN Take 1-2 tablets by mouth every 6 (six) hours as needed for moderate pain.   losartan 25 MG tablet Commonly known as: COZAAR TAKE 1 TABLET BY MOUTH EVERY DAY   metoprolol 200 MG 24 hr tablet Commonly known as: TOPROL-XL Take 1 tablet (200 mg total) by mouth daily.   MILK THISTLE PO Take 1 capsule by mouth 3 (three) times a week.   multivitamin with minerals Tabs tablet Take 1 tablet by mouth daily.   OMEGA 3 PO Take 1 capsule by mouth daily.   OVER THE COUNTER MEDICATION Take 1 packet by mouth at bedtime. NATURAL CALM MAGNESIUM POWDER-MIXES IN DRINK   tamsulosin 0.4 MG Caps capsule Commonly known as: FLOMAX Take 1 capsule (0.4 mg total) by mouth daily.   TURMERIC CURCUMIN PO Take 1-2 capsules by mouth daily.   VITAMIN D PO Take 5,000 Units by mouth daily.   ZINC PO Take 1 tablet by mouth 3 (three) times a week.       Allergies:  Allergies  Allergen Reactions   Corticosteroids Other (See Comments)    Muscle stiffness    Family History: Family History  Problem Relation Age of Onset   Arthritis Mother    Hypertension Mother    Diabetes Father    Hypertension Father    Cancer Father        bladder ca   CAD Father        CABG   Chronic Renal Failure Father    Atrial fibrillation Father    Healthy Brother    Healthy Brother     Social History:  reports that he has never smoked. He has never used smokeless tobacco. He reports current alcohol use of about 7.0 standard drinks of alcohol per week. He reports that he does not use drugs.   Physical Exam: BP (!) 189/84    Pulse 63    Ht 6\' 2"  (1.88 m)    Wt 209 lb (94.8 kg)    BMI 26.83 kg/m   Constitutional:  Alert and oriented, No acute distress. HEENT: [place] AT, moist mucus membranes.  Trachea midline, no masses. Cardiovascular: No clubbing, cyanosis, or edema. Respiratory: Normal respiratory effort, no increased work of breathing. Skin: No rashes, bruises  or suspicious lesions. Neurologic: Grossly intact, no focal deficits, moving all 4 extremities. Psychiatric: Normal mood and affect.  Laboratory Data:  Lab Results  Component Value Date  CREATININE 1.01 07/10/2019   Pertinent Imaging: Results for orders placed or performed in visit on 08/14/19  BLADDER SCAN AMB NON-IMAGING  Result Value Ref Range   Scan Result 47 ML    Assessment & Plan:    1. Benign prostatic hyperplasia with urinary obstruction Significant improvement obstructive urinary symptoms Okay to stop Flomax Recheck symptoms in 6 months with PVR   2. Elevated PSA Previous history of elevated PSA, likely related to gland size PSA at next visit for new baseline Pathology benign   3. Stress incontinence of urine Patient reassured that this is normal and is improving I recommended pelvic floor exercises.     [facility] [address] [place], [place] [address] [phone]  I, Lucas Mallow, am acting as a scribe for Dr. Hollice Espy,  I have reviewed the above documentation for accuracy and completeness, and I agree with the above.   Hollice Espy, MD

## 2019-08-14 ENCOUNTER — Other Ambulatory Visit: Payer: Self-pay

## 2019-08-14 ENCOUNTER — Ambulatory Visit (INDEPENDENT_AMBULATORY_CARE_PROVIDER_SITE_OTHER): Payer: No Typology Code available for payment source | Admitting: Urology

## 2019-08-14 VITALS — BP 189/84 | HR 63 | Ht 74.0 in | Wt 209.0 lb

## 2019-08-14 DIAGNOSIS — Z9079 Acquired absence of other genital organ(s): Secondary | ICD-10-CM

## 2019-08-14 LAB — BLADDER SCAN AMB NON-IMAGING: Scan Result: 47

## 2019-09-14 ENCOUNTER — Other Ambulatory Visit: Payer: 59

## 2019-09-14 ENCOUNTER — Other Ambulatory Visit: Payer: Self-pay

## 2019-09-14 ENCOUNTER — Inpatient Hospital Stay: Payer: No Typology Code available for payment source | Attending: Hematology and Oncology

## 2019-09-14 DIAGNOSIS — I509 Heart failure, unspecified: Secondary | ICD-10-CM | POA: Diagnosis not present

## 2019-09-14 DIAGNOSIS — C9210 Chronic myeloid leukemia, BCR/ABL-positive, not having achieved remission: Secondary | ICD-10-CM

## 2019-09-14 DIAGNOSIS — I110 Hypertensive heart disease with heart failure: Secondary | ICD-10-CM | POA: Insufficient documentation

## 2019-09-14 DIAGNOSIS — C9211 Chronic myeloid leukemia, BCR/ABL-positive, in remission: Secondary | ICD-10-CM | POA: Insufficient documentation

## 2019-09-14 DIAGNOSIS — D61810 Antineoplastic chemotherapy induced pancytopenia: Secondary | ICD-10-CM | POA: Insufficient documentation

## 2019-09-14 LAB — CBC WITH DIFFERENTIAL/PLATELET
Abs Immature Granulocytes: 0 10*3/uL (ref 0.00–0.07)
Basophils Absolute: 0 10*3/uL (ref 0.0–0.1)
Basophils Relative: 1 %
Eosinophils Absolute: 0.2 10*3/uL (ref 0.0–0.5)
Eosinophils Relative: 8 %
HCT: 36.2 % — ABNORMAL LOW (ref 39.0–52.0)
Hemoglobin: 11.5 g/dL — ABNORMAL LOW (ref 13.0–17.0)
Immature Granulocytes: 0 %
Lymphocytes Relative: 33 %
Lymphs Abs: 0.8 10*3/uL (ref 0.7–4.0)
MCH: 29 pg (ref 26.0–34.0)
MCHC: 31.8 g/dL (ref 30.0–36.0)
MCV: 91.4 fL (ref 80.0–100.0)
Monocytes Absolute: 0.3 10*3/uL (ref 0.1–1.0)
Monocytes Relative: 14 %
Neutro Abs: 1 10*3/uL — ABNORMAL LOW (ref 1.7–7.7)
Neutrophils Relative %: 44 %
Platelets: 196 10*3/uL (ref 150–400)
RBC: 3.96 MIL/uL — ABNORMAL LOW (ref 4.22–5.81)
RDW: 14.7 % (ref 11.5–15.5)
WBC: 2.4 10*3/uL — ABNORMAL LOW (ref 4.0–10.5)
nRBC: 0 % (ref 0.0–0.2)

## 2019-09-14 LAB — COMPREHENSIVE METABOLIC PANEL
ALT: 12 U/L (ref 0–44)
AST: 18 U/L (ref 15–41)
Albumin: 3.6 g/dL (ref 3.5–5.0)
Alkaline Phosphatase: 75 U/L (ref 38–126)
Anion gap: 9 (ref 5–15)
BUN: 21 mg/dL (ref 8–23)
CO2: 25 mmol/L (ref 22–32)
Calcium: 9.2 mg/dL (ref 8.9–10.3)
Chloride: 107 mmol/L (ref 98–111)
Creatinine, Ser: 1.09 mg/dL (ref 0.61–1.24)
GFR calc Af Amer: >60 mL/min (ref >60)
GFR calc non Af Amer: >60 mL/min (ref >60)
Glucose, Bld: 117 mg/dL — ABNORMAL HIGH (ref 70–99)
Potassium: 4.5 mmol/L (ref 3.5–5.1)
Sodium: 141 mmol/L (ref 135–145)
Total Bilirubin: 0.5 mg/dL (ref 0.3–1.2)
Total Protein: 7.4 g/dL (ref 6.5–8.1)

## 2019-09-21 LAB — BCR/ABL

## 2019-09-24 ENCOUNTER — Inpatient Hospital Stay (HOSPITAL_BASED_OUTPATIENT_CLINIC_OR_DEPARTMENT_OTHER): Payer: No Typology Code available for payment source | Admitting: Hematology and Oncology

## 2019-09-24 ENCOUNTER — Encounter: Payer: Self-pay | Admitting: Hematology and Oncology

## 2019-09-24 ENCOUNTER — Other Ambulatory Visit: Payer: Self-pay

## 2019-09-24 VITALS — BP 183/65 | HR 58 | Temp 98.4°F | Resp 18 | Ht 74.0 in | Wt 211.4 lb

## 2019-09-24 DIAGNOSIS — C9211 Chronic myeloid leukemia, BCR/ABL-positive, in remission: Secondary | ICD-10-CM | POA: Diagnosis not present

## 2019-09-24 DIAGNOSIS — T451X5A Adverse effect of antineoplastic and immunosuppressive drugs, initial encounter: Secondary | ICD-10-CM

## 2019-09-24 DIAGNOSIS — D61810 Antineoplastic chemotherapy induced pancytopenia: Secondary | ICD-10-CM

## 2019-09-24 DIAGNOSIS — I10 Essential (primary) hypertension: Secondary | ICD-10-CM

## 2019-09-24 DIAGNOSIS — C9210 Chronic myeloid leukemia, BCR/ABL-positive, not having achieved remission: Secondary | ICD-10-CM

## 2019-09-24 DIAGNOSIS — I509 Heart failure, unspecified: Secondary | ICD-10-CM

## 2019-09-24 NOTE — Assessment & Plan Note (Signed)
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

## 2019-09-24 NOTE — Assessment & Plan Note (Signed)
The patient is noted to have chronically elevated blood pressure I recommend close monitoring of blood pressure and to consult with his primary care doctor or cardiologist for medication adjustment due to his history of heart failure

## 2019-09-24 NOTE — Assessment & Plan Note (Signed)
He continues to be in remission on reduced dose Sprycel He tolerated treatment very well I would not recommend further dose adjustment or stopping his treatment because of documented resistance to Gleevec.  He does not fulfill the criteria for discontinuation of TKI based on current guidelines I will see him back in 6 months He is educated to watch out for signs and symptoms of side effects of treatment such as fluid retention, shortness of breath or infection 

## 2019-09-24 NOTE — Assessment & Plan Note (Signed)
He has no clinical signs and symptoms of congestive heart failure Observe for now

## 2019-09-24 NOTE — Progress Notes (Signed)
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 Comprehensive metabolic panel    Standing Status:   Standing    Number of Occurrences:   33    Standing Expiration Date:   09/23/2020  . CBC with Differential/Platelet    Standing Status:   Standing    Number of Occurrences:   22    Standing Expiration Date:   09/23/2020  . BCR-ABL    With RT-PCR technique    Standing Status:   Standing    Number of Occurrences:   22    Standing Expiration Date:   09/23/2020    All questions were answered. The patient knows to call the clinic with any problems, questions or concerns. The total time spent in the  appointment was 20 minutes encounter with patients including review of chart and various tests results, discussions about plan of care and coordination of care plan   Heath Lark, MD 09/24/2019 12:00 PM  INTERVAL HISTORY: Please see below for problem oriented charting. He returns for further follow-up of CML He is doing well No recent infection, fever or chills Denies recent cough, chest pain or shortness of breath Denies bilateral lower extremity edema He is compliant taking medications as directed He has no problems getting his medications refilled.  He has concerns about changing insurance next year  SUMMARY OF ONCOLOGIC HISTORY: Oncology History Overview Note  Physical   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[place])  11/01/2012 Bone Marrow Biopsy   Bone marrow aspirate and biopsy was performed for leukocytosis and thrombocytosis. 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   11/07/2012 - 08/09/2013 Chemotherapy   He was started on Gleevec 400 mg daily.   08/09/2013 Progression   Gleevec was discontinued due to a rising white blood cell count and positive ABL Kinase mutation study, 100% positive to [id number] mutation   08/09/2013 - 08/21/2013 Chemotherapy   His therapy is switched to hydroxyurea 1000 mg daily pending approval for Dasatinib.   08/22/2013 -  Chemotherapy   He is started on dasatinib.   11/19/2013 Tumor Marker   BCR/ABL is improving   02/22/2014 Pathology Results   BCR/ABL continues to improve and the patient is moving towards molecular remission   05/27/2014 Pathology Results  BCR/ABL b2a2 & b3a2 both are 0.05%, b2a2 & b3a2 IS both are 0.028%. Patient in MMR   09/26/2014 Pathology Results   Repeat BCR/ABL was not detectable   02/28/2015 Tumor Marker   Repeat BCR/ABL was not detectable   08/28/2015 Tumor Marker   Repeat BCR/ABL was not detectable   01/06/2016 Pathology Results   Repeat BCR/ABL was not detectable   04/15/2016 Pathology Results   Repeat BCR/ABL was not detectable    07/22/2016 Pathology Results   Repeat BCR/ABL was not detectable   11/29/2016 Pathology Results   Repeat BCR/ABL was not detectable   05/30/2017 Pathology Results   Repeat BCR/ABL was not detectable   11/29/2017 Pathology Results   Repeat BCR/ABL was not detectable   10/02/2018 Pathology Results   BCR/ABL is not detectable.   03/23/2019 Pathology Results   BCR/ABL is not detectable   09/14/2019 Pathology Results   BCR/ABL is not detectable     REVIEW OF SYSTEMS:   Constitutional: Denies fevers, chills or abnormal weight loss Eyes: Denies blurriness of vision Ears, nose, mouth, throat, and face: Denies mucositis or sore throat Respiratory: Denies cough, dyspnea or wheezes Cardiovascular: Denies palpitation, chest discomfort or lower extremity swelling Gastrointestinal:  Denies nausea, heartburn or change in bowel habits Skin: Denies abnormal skin rashes Lymphatics: Denies new lymphadenopathy or easy bruising Neurological:Denies numbness, tingling or new weaknesses Behavioral/Psych: Mood is stable, no new changes  All other systems were reviewed with the patient and are negative.  I have reviewed the past medical history, past surgical history, social history and family history with the patient and they are unchanged from previous note.  ALLERGIES:  is allergic to corticosteroids.  MEDICATIONS:  Current Outpatient Medications  Medication Sig Dispense Refill  . acetaminophen (TYLENOL) 500 MG tablet Take 1,000 mg by mouth every 6 (six) hours as needed for moderate pain or headache.     . APPLE CIDER VINEGAR PO Take 15 mLs by mouth daily.     . Cholecalciferol (VITAMIN D PO) Take 5,000 Units by mouth daily.    . Coenzyme Q10 (CO Q-10) 100 MG CAPS Take 100 mg by mouth daily.    . dasatinib (SPRYCEL) 50 MG tablet Take 1 tablet (50 mg total) by mouth daily. 90 tablet 11  . Digestive Enzymes (DIGESTIVE ENZYME PO) Take 1 tablet by mouth as needed.    Marland Kitchen losartan (COZAAR) 25 MG tablet  TAKE 1 TABLET BY MOUTH EVERY DAY 90 tablet 1  . metoprolol (TOPROL-XL) 200 MG 24 hr tablet Take 1 tablet (200 mg total) by mouth daily. 90 tablet 3  . MILK THISTLE PO Take 1 capsule by mouth 3 (three) times a week.     . Multiple Vitamin (MULTIVITAMIN WITH MINERALS) TABS tablet Take 1 tablet by mouth daily.    . Multiple Vitamins-Minerals (ZINC PO) Take 1 tablet by mouth 3 (three) times a week.    . Omega-3 Fatty Acids (OMEGA 3 PO) Take 1 capsule by mouth daily.    Marland Kitchen OVER THE COUNTER MEDICATION Take 1 packet by mouth at bedtime. NATURAL CALM MAGNESIUM POWDER-MIXES IN DRINK    . TURMERIC CURCUMIN PO Take 1-2 capsules by mouth daily.      No current facility-administered medications for this visit.    PHYSICAL EXAMINATION: ECOG PERFORMANCE STATUS: 1 - Symptomatic but completely ambulatory  Vitals:   09/24/19 1026  BP: (!) 183/65  Pulse: (!) 58  Resp: 18  Temp: 98.4 F (36.9 C)  SpO2: 100%   Filed  Weights   09/24/19 1026  Weight: 211 lb 6.4 oz (95.9 kg)    GENERAL:alert, no distress and comfortable SKIN: skin color, texture, turgor are normal, no rashes or significant lesions EYES: normal, Conjunctiva are pink and non-injected, sclera clear OROPHARYNX:no exudate, no erythema and lips, buccal mucosa, and tongue normal  NECK: supple, thyroid normal size, non-tender, without nodularity LYMPH:  no palpable lymphadenopathy in the cervical, axillary or inguinal LUNGS: clear to auscultation and percussion with normal breathing effort HEART: regular rate & rhythm and no murmurs and no lower extremity edema ABDOMEN:abdomen soft, non-tender and normal bowel sounds Musculoskeletal:no cyanosis of digits and no clubbing  NEURO: alert & oriented x 3 with fluent speech, no focal motor/sensory deficits  LABORATORY DATA:  I have reviewed the data as listed    Component Value Date/Time   NA 141 09/14/2019 0800   NA 139 11/29/2016 0829   K 4.5 09/14/2019 0800   K 4.3 11/29/2016 0829   CL  107 09/14/2019 0800   CO2 25 09/14/2019 0800   CO2 24 11/29/2016 0829   GLUCOSE 117 (H) 09/14/2019 0800   GLUCOSE 101 11/29/2016 0829   BUN 21 09/14/2019 0800   BUN 17.0 11/29/2016 0829   CREATININE 1.09 09/14/2019 0800   CREATININE 1.00 09/25/2018 0813   CREATININE 0.9 11/29/2016 0829   CALCIUM 9.2 09/14/2019 0800   CALCIUM 9.1 11/29/2016 0829   PROT 7.4 09/14/2019 0800   PROT 7.0 11/29/2016 0829   ALBUMIN 3.6 09/14/2019 0800   ALBUMIN 3.8 11/29/2016 0829   AST 18 09/14/2019 0800   AST 19 09/25/2018 0813   AST 17 11/29/2016 0829   ALT 12 09/14/2019 0800   ALT 16 09/25/2018 0813   ALT 16 11/29/2016 0829   ALKPHOS 75 09/14/2019 0800   ALKPHOS 63 11/29/2016 0829   BILITOT 0.5 09/14/2019 0800   BILITOT 0.7 09/25/2018 0813   BILITOT 0.83 11/29/2016 0829   GFRNONAA >60 09/14/2019 0800   GFRNONAA >60 09/25/2018 0813   GFRAA >60 09/14/2019 0800   GFRAA >60 09/25/2018 0813    No results found for: SPEP, UPEP  Lab Results  Component Value Date   WBC 2.4 (L) 09/14/2019   NEUTROABS 1.0 (L) 09/14/2019   HGB 11.5 (L) 09/14/2019   HCT 36.2 (L) 09/14/2019   MCV 91.4 09/14/2019   PLT 196 09/14/2019      Chemistry      Component Value Date/Time   NA 141 09/14/2019 0800   NA 139 11/29/2016 0829   K 4.5 09/14/2019 0800   K 4.3 11/29/2016 0829   CL 107 09/14/2019 0800   CO2 25 09/14/2019 0800   CO2 24 11/29/2016 0829   BUN 21 09/14/2019 0800   BUN 17.0 11/29/2016 0829   CREATININE 1.09 09/14/2019 0800   CREATININE 1.00 09/25/2018 0813   CREATININE 0.9 11/29/2016 0829      Component Value Date/Time   CALCIUM 9.2 09/14/2019 0800   CALCIUM 9.1 11/29/2016 0829   ALKPHOS 75 09/14/2019 0800   ALKPHOS 63 11/29/2016 0829   AST 18 09/14/2019 0800   AST 19 09/25/2018 0813   AST 17 11/29/2016 0829   ALT 12 09/14/2019 0800   ALT 16 09/25/2018 0813   ALT 16 11/29/2016 0829   BILITOT 0.5 09/14/2019 0800   BILITOT 0.7 09/25/2018 0813   BILITOT 0.83 11/29/2016 0829

## 2019-11-15 ENCOUNTER — Other Ambulatory Visit: Payer: Self-pay | Admitting: Cardiovascular Disease

## 2019-12-04 ENCOUNTER — Other Ambulatory Visit: Payer: Self-pay | Admitting: Cardiology

## 2019-12-04 DIAGNOSIS — Z01810 Encounter for preprocedural cardiovascular examination: Secondary | ICD-10-CM

## 2019-12-04 DIAGNOSIS — I4819 Other persistent atrial fibrillation: Secondary | ICD-10-CM

## 2019-12-04 DIAGNOSIS — I10 Essential (primary) hypertension: Secondary | ICD-10-CM

## 2020-01-09 ENCOUNTER — Telehealth: Payer: Self-pay

## 2020-01-09 NOTE — Telephone Encounter (Signed)
Return call to Pt he was inquiring about a change of insurance next year from his corporate insurance to Medicare, And how that would affect his visits.Gave him the number to [place] advocate to get the information he was asking about.

## 2020-02-13 ENCOUNTER — Encounter: Payer: Self-pay | Admitting: Urology

## 2020-02-13 ENCOUNTER — Other Ambulatory Visit: Payer: Self-pay

## 2020-02-13 ENCOUNTER — Ambulatory Visit (INDEPENDENT_AMBULATORY_CARE_PROVIDER_SITE_OTHER): Payer: No Typology Code available for payment source | Admitting: Urology

## 2020-02-13 VITALS — BP 198/79 | HR 69 | Ht 74.0 in | Wt 211.0 lb

## 2020-02-13 DIAGNOSIS — N393 Stress incontinence (female) (male): Secondary | ICD-10-CM

## 2020-02-13 DIAGNOSIS — N400 Enlarged prostate without lower urinary tract symptoms: Secondary | ICD-10-CM | POA: Diagnosis not present

## 2020-02-13 DIAGNOSIS — R972 Elevated prostate specific antigen [PSA]: Secondary | ICD-10-CM

## 2020-02-13 LAB — BLADDER SCAN AMB NON-IMAGING: Scan Result: 2

## 2020-02-13 NOTE — Progress Notes (Signed)
02/13/2020 9:01 AM   Larry Vasquez Aug 03, 1950 [id number]  Referring provider: [name], MD Larry Vasquez,  [place] [id number]  Chief Complaint  Patient presents with  . Benign Prostatic Hypertrophy    HPI: 69 year old male who returns today status post holep 07/2019.  He returns today for routine 44-month follow-up.  He is extremely pleased with his urinary symptoms.  He has an excellent stream and empties well.  No frequency or urgency.  He continues to have a very minute amount of leakage with a golf swing, otherwise is continent.  He does wear safety pad for this purpose.  He does been somewhat lax about doing these exercises.  He has apasthx of elevated PSA in the 5 range. His most recent PSA 4.2down from previous range.No previous hx of prostate bx.   Repeat PSA for new baseline ordered and pending today.    He has a remote hx of urinary retention few years ago and was in retention at the time of the procedure.  He requested outlet procedure here [name].  He underwent holmium laser enucleation of the prostate on 07/09/19. Surgical pathology reviewed, 47 g resection, benign tissue. Estimated prostate volume preoperatively about 130 cc.    IPSS    Row Name 02/13/20 0800         International Prostate Symptom Score   How often have you had the sensation of not emptying your bladder? Less than 1 in 5     How often have you had to urinate less than every two hours? Not at All     How often have you found you stopped and started again several times when you urinated? Less than 1 in 5 times     How often have you found it difficult to postpone urination? Less than 1 in 5 times     How often have you had a weak urinary stream? Not at All     How often have you had to strain to start urination? Not at All     How many times did you typically get up at night to urinate? None     Total IPSS Score 3        Quality of Life due to urinary symptoms   If you were to spend the rest of your life with your urinary condition just the way it is now how would you feel about that? Mostly Satisfied            Score:  1-7 Mild 8-19 Moderate 20-35 Severe    PMH: Past Medical History:  Diagnosis Date  . Anemia   . Cataract    Left eye  . CML (chronic myelocytic leukemia) ([name])   . Dysrhythmia   . Leukocytosis, unspecified 10/17/2012  . Thrombocytosis 10/17/2012  . White coat hypertension 06/06/2014    Surgical History: Past Surgical History:  Procedure Laterality Date  . ATRIAL FIBRILLATION ABLATION [name] 10/08/2016   Procedure: Atrial Fibrillation Ablation;  Surgeon: Constance Haw, MD;  Location: [facility];  Service: Cardiovascular;  Laterality: [name];  . CARDIOVERSION [name] 06/24/2016   Procedure: CARDIOVERSION;  Surgeon: [name], MD;  Location: [facility] ENDOSCOPY;  Service: Cardiovascular;  Laterality: [name];  . CATARACT EXTRACTION Left 01/01/2013  . EYE SURGERY  2012   Detached Retina  . HERNIA REPAIR    . HOLEP-LASER ENUCLEATION OF THE PROSTATE WITH MORCELLATION [name] 07/09/2019   Procedure: HOLEP-LASER ENUCLEATION OF THE PROSTATE  WITH MORCELLATION;  Surgeon: Larry Espy, MD;  Location: [facility] ORS;  Service: Urology;  Laterality: [name];  . JOINT REPLACEMENT  2006   Total Knee Left  . TONSILLECTOMY      Home Medications:  Allergies as of 02/13/2020      Reactions   Corticosteroids Other (See Comments)   Muscle stiffness      Medication List       Accurate as of February 13, 2020  9:01 AM. If you have any questions, ask your nurse or doctor.        acetaminophen 500 MG tablet Commonly known as: TYLENOL Take 1,000 mg by mouth every 6 (six) hours as needed for moderate pain or headache.   APPLE CIDER VINEGAR PO Take 15 mLs by mouth daily.   BLACK CURRANT SEED OIL PO Take by mouth.   Co Q-10 100 MG Caps Take 100 mg by mouth daily.   dasatinib 50 MG  tablet Commonly known as: Sprycel Take 1 tablet (50 mg total) by mouth daily.   DIGESTIVE ENZYME PO Take 1 tablet by mouth as needed.   losartan 25 MG tablet Commonly known as: COZAAR TAKE 1 TABLET BY MOUTH EVERY DAY   metoprolol 200 MG 24 hr tablet Commonly known as: TOPROL-XL TAKE 1 TABLET BY MOUTH EVERY DAY   MILK THISTLE PO Take 1 capsule by mouth 3 (three) times a week.   multivitamin with minerals Tabs tablet Take 1 tablet by mouth daily.   OMEGA 3 PO Take 1 capsule by mouth daily.   OVER THE COUNTER MEDICATION Take 1 packet by mouth at bedtime. NATURAL CALM MAGNESIUM POWDER-MIXES IN DRINK   PUMPKIN SEED PO Take by mouth.   Selenium 200 MCG Caps Take by mouth.   Taurine 1000 MG Caps Take by mouth.   TURMERIC CURCUMIN PO Take 1-2 capsules by mouth daily.   VITAMIN D PO Take 5,000 Units by mouth daily.   ZINC PO Take 1 tablet by mouth 3 (three) times a week.       Allergies:  Allergies  Allergen Reactions  . Corticosteroids Other (See Comments)    Muscle stiffness    Family History: Family History  Problem Relation Age of Onset  . Arthritis Mother   . Hypertension Mother   . Diabetes Father   . Hypertension Father   . Cancer Father        bladder ca  . CAD Father        CABG  . Chronic Renal Failure Father   . Atrial fibrillation Father   . Healthy Brother   . Healthy Brother     Social History:  reports that he has never smoked. He has never used smokeless tobacco. He reports current alcohol use of about 7.0 standard drinks of alcohol per week. He reports that he does not use drugs.   Physical Exam: BP (!) 198/79   Pulse 69   Ht 6\' 2"  (1.88 m)   Wt 211 lb (95.7 kg)   BMI 27.09 kg/m   Constitutional:  Alert and oriented, No acute distress. HEENT: [place] AT, moist mucus membranes.  Trachea midline, no masses. Cardiovascular: No clubbing, cyanosis, or edema. Respiratory: Normal respiratory effort, no increased work of  breathing. Neurologic: Grossly intact, no focal deficits, moving all 4 extremities. Psychiatric: Normal mood and affect.   Pertinent Imaging: Results for orders placed or performed in visit on 02/13/20  Bladder Scan (Post Void Residual) in office  Result Value Ref Range  Scan Result 2     Assessment & Plan:    1. BPH with elevated PSA Doing extremely well status post holep Minimal to no ongoing obstructive or irritative urinary symptoms with excellent emptying At this point time, is reasonable to follow-up as needed Recheck PSA for new baseline today, will call him with this number-recommend annual PSA for a few years at least by his PCP to ensure stability given his history  2. Stress incontinence, male Recommend resumption of pelvic floor exercises, anticipate continued improvement with strenuous physical activity if he engages in these on a regular basis Overall minimal bother - PSA; Future - Bladder Scan (Post Void Residual) in office - PSA   Larry Espy, MD  [facility] [address] [place], [place] [address] [phone]

## 2020-02-14 ENCOUNTER — Telehealth: Payer: Self-pay | Admitting: *Deleted

## 2020-02-14 LAB — PSA: Prostate Specific Ag, Serum: 3.9 ng/mL (ref 0.0–4.0)

## 2020-02-14 NOTE — Telephone Encounter (Addendum)
Patient advised, voiced understanding.   ----- Message from Hollice Espy, MD sent at 02/14/2020  9:53 AM EST ----- PSA is down to 3.9, now in the normal range.  I would recommend having this followed annually with this being her new baseline.  Hollice Espy, MD

## 2020-02-15 ENCOUNTER — Inpatient Hospital Stay: Payer: No Typology Code available for payment source | Attending: Hematology and Oncology

## 2020-02-15 ENCOUNTER — Other Ambulatory Visit: Payer: Self-pay

## 2020-02-15 DIAGNOSIS — C9210 Chronic myeloid leukemia, BCR/ABL-positive, not having achieved remission: Secondary | ICD-10-CM

## 2020-02-15 DIAGNOSIS — C9211 Chronic myeloid leukemia, BCR/ABL-positive, in remission: Secondary | ICD-10-CM | POA: Insufficient documentation

## 2020-02-15 DIAGNOSIS — I10 Essential (primary) hypertension: Secondary | ICD-10-CM | POA: Diagnosis not present

## 2020-02-15 DIAGNOSIS — T451X5A Adverse effect of antineoplastic and immunosuppressive drugs, initial encounter: Secondary | ICD-10-CM | POA: Insufficient documentation

## 2020-02-15 DIAGNOSIS — D61810 Antineoplastic chemotherapy induced pancytopenia: Secondary | ICD-10-CM | POA: Diagnosis not present

## 2020-02-15 LAB — COMPREHENSIVE METABOLIC PANEL
ALT: 19 U/L (ref 0–44)
AST: 30 U/L (ref 15–41)
Albumin: 3.7 g/dL (ref 3.5–5.0)
Alkaline Phosphatase: 64 U/L (ref 38–126)
Anion gap: 9 (ref 5–15)
BUN: 20 mg/dL (ref 8–23)
CO2: 24 mmol/L (ref 22–32)
Calcium: 9 mg/dL (ref 8.9–10.3)
Chloride: 106 mmol/L (ref 98–111)
Creatinine, Ser: 1.13 mg/dL (ref 0.61–1.24)
GFR, Estimated: >60 mL/min (ref >60)
Glucose, Bld: 124 mg/dL — ABNORMAL HIGH (ref 70–99)
Potassium: 4 mmol/L (ref 3.5–5.1)
Sodium: 139 mmol/L (ref 135–145)
Total Bilirubin: 0.8 mg/dL (ref 0.3–1.2)
Total Protein: 7.1 g/dL (ref 6.5–8.1)

## 2020-02-15 LAB — CBC WITH DIFFERENTIAL/PLATELET
Abs Immature Granulocytes: 0.01 10*3/uL (ref 0.00–0.07)
Basophils Absolute: 0 10*3/uL (ref 0.0–0.1)
Basophils Relative: 1 %
Eosinophils Absolute: 0.2 10*3/uL (ref 0.0–0.5)
Eosinophils Relative: 5 %
HCT: 39.1 % (ref 39.0–52.0)
Hemoglobin: 12.8 g/dL — ABNORMAL LOW (ref 13.0–17.0)
Immature Granulocytes: 0 %
Lymphocytes Relative: 32 %
Lymphs Abs: 1.1 10*3/uL (ref 0.7–4.0)
MCH: 30.3 pg (ref 26.0–34.0)
MCHC: 32.7 g/dL (ref 30.0–36.0)
MCV: 92.7 fL (ref 80.0–100.0)
Monocytes Absolute: 0.5 10*3/uL (ref 0.1–1.0)
Monocytes Relative: 15 %
Neutro Abs: 1.7 10*3/uL (ref 1.7–7.7)
Neutrophils Relative %: 47 %
Platelets: 170 10*3/uL (ref 150–400)
RBC: 4.22 MIL/uL (ref 4.22–5.81)
RDW: 14.1 % (ref 11.5–15.5)
WBC: 3.5 10*3/uL — ABNORMAL LOW (ref 4.0–10.5)
nRBC: 0 % (ref 0.0–0.2)

## 2020-02-21 LAB — BCR/ABL

## 2020-02-22 ENCOUNTER — Other Ambulatory Visit: Payer: Self-pay

## 2020-02-22 ENCOUNTER — Encounter: Payer: Self-pay | Admitting: Hematology and Oncology

## 2020-02-22 ENCOUNTER — Inpatient Hospital Stay (HOSPITAL_BASED_OUTPATIENT_CLINIC_OR_DEPARTMENT_OTHER): Payer: No Typology Code available for payment source | Admitting: Hematology and Oncology

## 2020-02-22 DIAGNOSIS — D61810 Antineoplastic chemotherapy induced pancytopenia: Secondary | ICD-10-CM

## 2020-02-22 DIAGNOSIS — Z299 Encounter for prophylactic measures, unspecified: Secondary | ICD-10-CM

## 2020-02-22 DIAGNOSIS — T451X5A Adverse effect of antineoplastic and immunosuppressive drugs, initial encounter: Secondary | ICD-10-CM

## 2020-02-22 DIAGNOSIS — C9210 Chronic myeloid leukemia, BCR/ABL-positive, not having achieved remission: Secondary | ICD-10-CM

## 2020-02-22 DIAGNOSIS — I10 Essential (primary) hypertension: Secondary | ICD-10-CM | POA: Diagnosis not present

## 2020-02-22 DIAGNOSIS — C9211 Chronic myeloid leukemia, BCR/ABL-positive, in remission: Secondary | ICD-10-CM | POA: Diagnosis not present

## 2020-02-22 NOTE — Assessment & Plan Note (Signed)
We discussed the importance of annual influenza vaccination The patient declined influenza vaccination and Covid vaccination

## 2020-02-22 NOTE — Assessment & Plan Note (Signed)
He continues to be in remission on reduced dose Sprycel He tolerated treatment very well I would not recommend further dose adjustment or stopping his treatment because of documented resistance to [place].  He does not fulfill the criteria for discontinuation of TKI based on current guidelines I will see him back in 6 months He is educated to watch out for signs and symptoms of side effects of treatment such as fluid retention, shortness of breath or infection

## 2020-02-22 NOTE — Assessment & Plan Note (Signed)
He has poorly controlled hypertension and have history of atrial fibrillation I recommend vigilant blood pressure control at home and aggressive management of cardiovascular risk factors 

## 2020-02-22 NOTE — Assessment & Plan Note (Signed)
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

## 2020-02-22 NOTE — Progress Notes (Signed)
[facility] OFFICE PROGRESS NOTE  Patient Care Team: [name], MD as PCP - General (Family Medicine) [name], MD as PCP - Cardiology (Cardiology)  ASSESSMENT & PLAN:  Chronic myelogenous leukemia [facility]) He continues to be in remission on reduced dose Sprycel He tolerated treatment very well I would not recommend further dose adjustment or stopping his treatment because of documented resistance to [place].  He does not fulfill the criteria for discontinuation of TKI based on current guidelines I will see him back in 6 months He is educated to watch out for signs and symptoms of side effects of treatment such as fluid retention, shortness of breath or infection  Pancytopenia due to antineoplastic chemotherapy (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   All questions were answered. The patient knows to call the clinic with any problems, questions or concerns. The total time spent in the appointment was 20 minutes encounter with patients including review of chart and various tests results, discussions about plan of care and coordination of care plan   Heath Lark, MD 02/22/2020 9:23 AM  INTERVAL HISTORY: Please see below for problem oriented charting. He returns for further follow-up He is compliant taking his medications as directed He is asking whether the dose of his dasatinib can be further  reduced He has no side effect so far No fluid retention Denies recent infection, fever or chills He has not been checking his blood pressure much lately  SUMMARY OF ONCOLOGIC HISTORY: Oncology History Overview Note  Physical   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[place])  11/01/2012 Bone Marrow Biopsy   Bone marrow aspirate and biopsy was performed for leukocytosis and thrombocytosis. 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   11/07/2012 - 08/09/2013 Chemotherapy   He was started on Gleevec 400 mg daily.   08/09/2013 Progression   Gleevec was discontinued due to a rising white blood cell count and positive ABL Kinase mutation study, 100% positive to E255V mutation   08/09/2013 - 08/21/2013 Chemotherapy   His therapy is switched to hydroxyurea 1000 mg daily pending approval for Dasatinib.   08/22/2013 -  Chemotherapy   He is started on dasatinib.   11/19/2013 Tumor Marker   BCR/ABL is improving   02/22/2014 Pathology Results   BCR/ABL continues to improve and the patient is moving towards molecular remission   05/27/2014 Pathology Results   BCR/ABL b2a2 & b3a2 both are 0.05%, b2a2 & b3a2 IS both are 0.028%. Patient in MMR   09/26/2014 Pathology Results   Repeat BCR/ABL was not detectable   02/28/2015 Tumor Marker   Repeat BCR/ABL was not detectable   08/28/2015 Tumor Marker   Repeat BCR/ABL was not detectable   01/06/2016 Pathology Results   Repeat BCR/ABL was not detectable   04/15/2016 Pathology Results   Repeat BCR/ABL was not detectable   07/22/2016 Pathology Results   Repeat BCR/ABL was not detectable   11/29/2016 Pathology Results   Repeat BCR/ABL was not detectable   05/30/2017 Pathology Results  Repeat BCR/ABL was not detectable   11/29/2017 Pathology Results   Repeat BCR/ABL was not detectable   10/02/2018 Pathology Results   BCR/ABL is not detectable.   03/23/2019 Pathology Results   BCR/ABL is not detectable   09/14/2019 Pathology Results   BCR/ABL is not detectable    02/15/2020 Pathology Results   BCR/ABL is not detectable     REVIEW OF SYSTEMS:   Constitutional: Denies fevers, chills or abnormal weight loss Eyes: Denies blurriness of vision Ears, nose, mouth, throat, and face: Denies mucositis or sore throat Respiratory: Denies cough, dyspnea or wheezes Cardiovascular: Denies palpitation, chest discomfort or lower extremity swelling Gastrointestinal:  Denies nausea, heartburn or change in bowel habits Skin: Denies abnormal skin rashes Lymphatics: Denies new lymphadenopathy or easy bruising Neurological:Denies numbness, tingling or new weaknesses Behavioral/Psych: Mood is stable, no new changes  All other systems were reviewed with the patient and are negative.  I have reviewed the past medical history, past surgical history, social history and family history with the patient and they are unchanged from previous note.  ALLERGIES:  is allergic to corticosteroids.  MEDICATIONS:  Current Outpatient Medications  Medication Sig Dispense Refill  . acetaminophen (TYLENOL) 500 MG tablet Take 1,000 mg by mouth every 6 (six) hours as needed for moderate pain or headache.     . APPLE CIDER VINEGAR PO Take 15 mLs by mouth daily.     Marland Kitchen BLACK CURRANT SEED OIL PO Take by mouth.    . Cholecalciferol (VITAMIN D PO) Take 5,000 Units by mouth daily.    . Coenzyme Q10 (CO Q-10) 100 MG CAPS Take 100 mg by mouth daily.    . dasatinib (SPRYCEL) 50 MG tablet Take 1 tablet (50 mg total) by mouth daily. 90 tablet 11  . Digestive Enzymes (DIGESTIVE ENZYME PO) Take 1 tablet by mouth as needed.    Marland Kitchen losartan (COZAAR) 25 MG tablet TAKE 1 TABLET BY MOUTH EVERY DAY 90 tablet 1  . metoprolol (TOPROL-XL) 200 MG 24 hr tablet TAKE 1 TABLET BY MOUTH EVERY DAY 90 tablet 3  . MILK THISTLE PO Take 1 capsule by mouth 3 (three) times a week.     . Multiple Vitamin (MULTIVITAMIN WITH MINERALS) TABS tablet Take 1 tablet by mouth daily.    . Multiple Vitamins-Minerals (ZINC PO) Take 1  tablet by mouth 3 (three) times a week.    . Omega-3 Fatty Acids (OMEGA 3 PO) Take 1 capsule by mouth daily.    Marland Kitchen OVER THE COUNTER MEDICATION Take 1 packet by mouth at bedtime. NATURAL CALM MAGNESIUM POWDER-MIXES IN DRINK    . PUMPKIN SEED PO Take by mouth.    . Selenium 200 MCG CAPS Take by mouth.    . Taurine 1000 MG CAPS Take by mouth.    . TURMERIC CURCUMIN PO Take 1-2 capsules by mouth daily.      No current facility-administered medications for this visit.    PHYSICAL EXAMINATION: ECOG PERFORMANCE STATUS: 1 - Symptomatic but completely ambulatory  Vitals:   02/22/20 0807  BP: (!) 165/64  Pulse: 74  Resp: 18  Temp: 98.5 F (36.9 C)  SpO2: 100%   Filed Weights   02/22/20 0807  Weight: 217 lb 9.6 oz (98.7 kg)    GENERAL:alert, no distress and comfortable SKIN: skin color, texture, turgor are normal, no rashes or significant lesions EYES: normal, Conjunctiva are pink and non-injected, sclera clear OROPHARYNX:no exudate, no erythema and lips, buccal mucosa, and tongue normal  NECK: supple,  thyroid normal size, non-tender, without nodularity LYMPH:  no palpable lymphadenopathy in the cervical, axillary or inguinal LUNGS: clear to auscultation and percussion with normal breathing effort HEART: regular rate & rhythm and no murmurs and no lower extremity edema ABDOMEN:abdomen soft, non-tender and normal bowel sounds Musculoskeletal:no cyanosis of digits and no clubbing  NEURO: alert & oriented x 3 with fluent speech, no focal motor/sensory deficits  LABORATORY DATA:  I have reviewed the data as listed    Component Value Date/Time   NA 139 02/15/2020 0815   NA 139 11/29/2016 0829   K 4.0 02/15/2020 0815   K 4.3 11/29/2016 0829   CL 106 02/15/2020 0815   CO2 24 02/15/2020 0815   CO2 24 11/29/2016 0829   GLUCOSE 124 (H) 02/15/2020 0815   GLUCOSE 101 11/29/2016 0829   BUN 20 02/15/2020 0815   BUN 17.0 11/29/2016 0829   CREATININE 1.13 02/15/2020 0815   CREATININE 1.00  09/25/2018 0813   CREATININE 0.9 11/29/2016 0829   CALCIUM 9.0 02/15/2020 0815   CALCIUM 9.1 11/29/2016 0829   PROT 7.1 02/15/2020 0815   PROT 7.0 11/29/2016 0829   ALBUMIN 3.7 02/15/2020 0815   ALBUMIN 3.8 11/29/2016 0829   AST 30 02/15/2020 0815   AST 19 09/25/2018 0813   AST 17 11/29/2016 0829   ALT 19 02/15/2020 0815   ALT 16 09/25/2018 0813   ALT 16 11/29/2016 0829   ALKPHOS 64 02/15/2020 0815   ALKPHOS 63 11/29/2016 0829   BILITOT 0.8 02/15/2020 0815   BILITOT 0.7 09/25/2018 0813   BILITOT 0.83 11/29/2016 0829   GFRNONAA >60 02/15/2020 0815   GFRNONAA >60 09/25/2018 0813   GFRAA >60 09/14/2019 0800   GFRAA >60 09/25/2018 0813    No results found for: SPEP, UPEP  Lab Results  Component Value Date   WBC 3.5 (L) 02/15/2020   NEUTROABS 1.7 02/15/2020   HGB 12.8 (L) 02/15/2020   HCT 39.1 02/15/2020   MCV 92.7 02/15/2020   PLT 170 02/15/2020      Chemistry      Component Value Date/Time   NA 139 02/15/2020 0815   NA 139 11/29/2016 0829   K 4.0 02/15/2020 0815   K 4.3 11/29/2016 0829   CL 106 02/15/2020 0815   CO2 24 02/15/2020 0815   CO2 24 11/29/2016 0829   BUN 20 02/15/2020 0815   BUN 17.0 11/29/2016 0829   CREATININE 1.13 02/15/2020 0815   CREATININE 1.00 09/25/2018 0813   CREATININE 0.9 11/29/2016 0829      Component Value Date/Time   CALCIUM 9.0 02/15/2020 0815   CALCIUM 9.1 11/29/2016 0829   ALKPHOS 64 02/15/2020 0815   ALKPHOS 63 11/29/2016 0829   AST 30 02/15/2020 0815   AST 19 09/25/2018 0813   AST 17 11/29/2016 0829   ALT 19 02/15/2020 0815   ALT 16 09/25/2018 0813   ALT 16 11/29/2016 0829   BILITOT 0.8 02/15/2020 0815   BILITOT 0.7 09/25/2018 0813   BILITOT 0.83 11/29/2016 0829

## 2020-03-11 ENCOUNTER — Ambulatory Visit: Payer: Self-pay | Admitting: Urology

## 2020-03-14 ENCOUNTER — Telehealth: Payer: Self-pay | Admitting: Pharmacist

## 2020-03-14 NOTE — Telephone Encounter (Signed)
Oral Oncology Pharmacist Encounter   Prior Authorization for Sprycel (dasatinib) has been approved.     Effective dates: 03/13/2020 through 03/13/2021   Leron Croak, PharmD, BCPS Hematology/Oncology Clinical Pharmacist [facility] [phone] 03/14/2020 7:55 AM

## 2020-03-17 ENCOUNTER — Other Ambulatory Visit: Payer: No Typology Code available for payment source

## 2020-03-27 ENCOUNTER — Ambulatory Visit: Payer: No Typology Code available for payment source | Admitting: Hematology and Oncology

## 2020-05-01 ENCOUNTER — Other Ambulatory Visit: Payer: Self-pay | Admitting: Hematology and Oncology

## 2020-05-20 ENCOUNTER — Other Ambulatory Visit: Payer: Self-pay | Admitting: Cardiology

## 2020-06-02 ENCOUNTER — Ambulatory Visit: Payer: No Typology Code available for payment source | Admitting: Family Medicine

## 2020-06-23 ENCOUNTER — Ambulatory Visit: Payer: No Typology Code available for payment source | Admitting: Family Medicine

## 2020-07-23 ENCOUNTER — Ambulatory Visit: Payer: 59 | Admitting: Family Medicine

## 2020-07-24 ENCOUNTER — Telehealth: Payer: Self-pay | Admitting: Hematology and Oncology

## 2020-07-24 NOTE — Telephone Encounter (Signed)
Rescheduled per provider. Called pt and left a msg

## 2020-07-29 ENCOUNTER — Other Ambulatory Visit: Payer: Self-pay | Admitting: Cardiology

## 2020-08-11 ENCOUNTER — Other Ambulatory Visit: Payer: No Typology Code available for payment source

## 2020-08-14 ENCOUNTER — Encounter: Payer: Self-pay | Admitting: Family Medicine

## 2020-08-14 ENCOUNTER — Ambulatory Visit (INDEPENDENT_AMBULATORY_CARE_PROVIDER_SITE_OTHER): Payer: 59 | Admitting: Family Medicine

## 2020-08-14 ENCOUNTER — Other Ambulatory Visit: Payer: Self-pay

## 2020-08-14 VITALS — BP 187/79 | HR 58 | Ht 71.25 in | Wt 216.4 lb

## 2020-08-14 DIAGNOSIS — I509 Heart failure, unspecified: Secondary | ICD-10-CM

## 2020-08-14 DIAGNOSIS — I10 Essential (primary) hypertension: Secondary | ICD-10-CM

## 2020-08-14 DIAGNOSIS — Z8679 Personal history of other diseases of the circulatory system: Secondary | ICD-10-CM | POA: Diagnosis not present

## 2020-08-14 DIAGNOSIS — C9210 Chronic myeloid leukemia, BCR/ABL-positive, not having achieved remission: Secondary | ICD-10-CM

## 2020-08-14 DIAGNOSIS — Z0000 Encounter for general adult medical examination without abnormal findings: Secondary | ICD-10-CM | POA: Diagnosis not present

## 2020-08-14 DIAGNOSIS — M25571 Pain in right ankle and joints of right foot: Secondary | ICD-10-CM

## 2020-08-14 DIAGNOSIS — D61810 Antineoplastic chemotherapy induced pancytopenia: Secondary | ICD-10-CM

## 2020-08-14 DIAGNOSIS — T451X5A Adverse effect of antineoplastic and immunosuppressive drugs, initial encounter: Secondary | ICD-10-CM

## 2020-08-14 DIAGNOSIS — R972 Elevated prostate specific antigen [PSA]: Secondary | ICD-10-CM

## 2020-08-14 DIAGNOSIS — R739 Hyperglycemia, unspecified: Secondary | ICD-10-CM

## 2020-08-14 DIAGNOSIS — N400 Enlarged prostate without lower urinary tract symptoms: Secondary | ICD-10-CM

## 2020-08-14 NOTE — Progress Notes (Signed)
Office Visit Note   Patient: Larry Vasquez           Date of Birth: [birth year]           MRN: [id number] Visit Date: 08/14/2020 Requested by: [name], [address] [place],  [place] [address] PCP: Eunice Blase, MD  Subjective: Chief Complaint  Patient presents with   Other    Establish primary care    HPI: He is here to establish care.  He would like to have a wellness examination since his last one was in 2020.  He was referred by Orinda Kenner.  Overall he is doing very well.  He has a history of hypertension for which he takes metoprolol and losartan.  He has a component of whitecoat hypertension, when he has his blood pressure checked elsewhere it is usually normal.  He is asymptomatic from a blood pressure standpoint.  He has a history of atrial fibrillation treated successfully with ablation.  He was able to come off anticoagulants per his cardiologist.    He has a history of BPH treated successfully with laser surgery.  He still has occasional leakage, but he is able to sleep through the night.  His PSA readings have been below 4.  He has a history of bilateral knee osteoarthritis and is doing well status post replacement.  He does complain of right medial ankle pain, he sprained his ankle when he was younger and it has always bothered him to some degree.  He is still active playing golf but sometimes he has to use a cart rather than walking.  He has a history of CML which is currently in remission on medication.  He takes Sprycel at the lowest dosage.  He has not had a colonoscopy, and does not want one.  There is no family history of colon cancer.  He is up-to-date on eye exams and dental exams.  He still works Architect.  Family history was reviewed.                 ROS:   All other systems were reviewed and are negative.  Objective: Vital Signs: BP (!) 187/79 (BP Location: Left Arm, Patient Position: Sitting, Cuff Size:  Normal)   Pulse (!) 58   Ht 5' 11.25" (1.81 m)   Wt 216 lb 6.4 oz (98.2 kg)   BMI 29.97 kg/m   Physical Exam:  General: No suspicious and oriented, in no acute distress. Pulm:  Breathing unlabored. Psy:  Normal mood, congruent affect. Skin: No suspicious lesions seen. HEENT: Tympanic membranes are normal bilaterally.  No neck lymphadenopathy, no thyromegaly or nodules, no carotid bruits. CV: Regular rate and rhythm without murmurs, rubs, or gallops.  Occasional ectopic beat.  Trace bilateral lower extremity peripheral edema.  2+ radial and posterior tibial pulses. Lungs: Clear to auscultation throughout with no wheezing or areas of consolidation. Right ankle: Trace effusion with no warmth or erythema.  He is tender along the tibialis posterior tendon.  He has pain with supination of his foot against resistance.    Imaging: No results found.  Assessment & Plan: Wellness examination -Labs today. CML, in remission on medication - Labs today.Right ankle pain with suspected tibialis posterior tendinopathy - Ankle rehab, could try iontophoresis in the future.  He apparently had a reaction to steroid injection in his knee in the past.  4.  Hypertension, atrial fibrillation - Doing well on current regimen.   Procedures: No procedures performed  PMFS History: Patient Active Problem List   Diagnosis Date Noted   Preventive measure 02/22/2020   BPH with urinary obstruction 07/09/2019   Primary osteoarthritis of right knee 11/21/2017   History of atrial fibrillation 01/25/2017   CML (chronic myelocytic leukemia) ([facility])    Cataract    Hematoma 10/11/2016   Status post catheter ablation of atrial flutter 10/09/2016   Status post catheter ablation of atrial fibrillation 10/09/2016   AF (atrial fibrillation) ([facility]) 10/08/2016   CHF (congestive heart failure) ([facility]) 05/25/2016   Atrial fibrillation, new onset ([facility]) 05/25/2016   Essential (primary) hypertension 05/25/2016    Atrial fibrillation ([facility]) 05/25/2016   Atrial fibrillation with RVR ([facility])    Prostate cancer screening 10/22/2015   Routine health maintenance 10/22/2015   BPH with elevated PSA 10/16/2015   White coat syndrome with hypertension 06/06/2014   Spells 11/28/2013   Leukopenia due to antineoplastic chemotherapy ([facility]) 11/28/2013   Pancytopenia due to antineoplastic chemotherapy ([facility]) 08/09/2013   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[facility]) 01/30/2013   Thrombocytosis 10/17/2012   Past Medical History:  Diagnosis Date   Anemia    Cataract    Left eye   CML (chronic myelocytic leukemia) ([facility])    Dysrhythmia    Leukocytosis, unspecified 10/17/2012   Thrombocytosis 10/17/2012   White coat hypertension 06/06/2014    Family History  Problem Relation Age of Onset   Arthritis Mother    Hypertension Mother    Diabetes Father    Hypertension Father    Cancer Father        bladder ca   CAD Father        CABG   Chronic Renal Failure Father    Atrial fibrillation Father    Bladder Cancer Father    Healthy Brother    Healthy Brother     Past Surgical History:  Procedure Laterality Date   ATRIAL FIBRILLATION ABLATION [name] 10/08/2016   Procedure: Atrial Fibrillation Ablation;  Surgeon: Constance Haw, MD;  Location: [facility];  Service: Cardiovascular;  Laterality: [name];   CARDIOVERSION [name] 06/24/2016   Procedure: CARDIOVERSION;  Surgeon: [name], MD;  Location: [facility];  Service: Cardiovascular;  Laterality: [name];   CATARACT EXTRACTION Left 01/01/2013   EYE SURGERY  2012   Detached Retina   HERNIA REPAIR     HOLEP-LASER ENUCLEATION OF THE PROSTATE WITH MORCELLATION [name] 07/09/2019   Procedure: HOLEP-LASER ENUCLEATION OF THE PROSTATE WITH MORCELLATION;  Surgeon: Hollice Espy, MD;  Location: [facility];  Service: Urology;  Laterality: [name];   JOINT REPLACEMENT  2006   Total Knee Left   TONSILLECTOMY     Social History   Occupational History   Not on file  Tobacco Use    Smoking status: Never   Smokeless tobacco: Never  Vaping Use   Vaping Use: Never used  Substance and Sexual Activity   Alcohol use: Yes    Alcohol/week: 7.0 standard drinks    Types: 7 Glasses of wine per week    Comment: daily wine    Drug use: No   Sexual activity: Yes    Birth control/protection: None

## 2020-08-15 ENCOUNTER — Telehealth: Payer: Self-pay | Admitting: Family Medicine

## 2020-08-15 LAB — HEMOGLOBIN A1C
Hgb A1c MFr Bld: 5.4 % of total Hgb (ref <5.7)
Mean Plasma Glucose: 108 mg/dL
eAG (mmol/L): 6 mmol/L

## 2020-08-15 LAB — CBC WITH DIFFERENTIAL/PLATELET
Absolute Monocytes: 509 cells/uL (ref 200–950)
Basophils Absolute: 42 cells/uL (ref 0–200)
Basophils Relative: 1.1 %
Eosinophils Absolute: 148 cells/uL (ref 15–500)
Eosinophils Relative: 3.9 %
HCT: 38.8 % (ref 38.5–50.0)
Hemoglobin: 12.9 g/dL — ABNORMAL LOW (ref 13.2–17.1)
Lymphs Abs: 809 cells/uL — ABNORMAL LOW (ref 850–3900)
MCH: 30.6 pg (ref 27.0–33.0)
MCHC: 33.2 g/dL (ref 32.0–36.0)
MCV: 91.9 fL (ref 80.0–100.0)
MPV: 10.7 fL (ref 7.5–12.5)
Monocytes Relative: 13.4 %
Neutro Abs: 2291 cells/uL (ref 1500–7800)
Neutrophils Relative %: 60.3 %
Platelets: 188 10*3/uL (ref 140–400)
RBC: 4.22 10*6/uL (ref 4.20–5.80)
RDW: 13.1 % (ref 11.0–15.0)
Total Lymphocyte: 21.3 %
WBC: 3.8 10*3/uL (ref 3.8–10.8)

## 2020-08-15 LAB — PSA: PSA: 3.54 ng/mL (ref <=4.00)

## 2020-08-15 LAB — LIPID PANEL
Cholesterol: 231 mg/dL — ABNORMAL HIGH (ref <200)
HDL: 56 mg/dL (ref >=40)
LDL Cholesterol (Calc): 156 mg/dL (calc) — ABNORMAL HIGH
Non-HDL Cholesterol (Calc): 175 mg/dL (calc) — ABNORMAL HIGH (ref <130)
Total CHOL/HDL Ratio: 4.1 (calc) (ref <5.0)
Triglycerides: 88 mg/dL (ref <150)

## 2020-08-15 LAB — COMPREHENSIVE METABOLIC PANEL
AG Ratio: 1.4 (calc) (ref 1.0–2.5)
ALT: 10 U/L (ref 9–46)
AST: 14 U/L (ref 10–35)
Albumin: 4.2 g/dL (ref 3.6–5.1)
Alkaline phosphatase (APISO): 62 U/L (ref 35–144)
BUN: 23 mg/dL (ref 7–25)
CO2: 28 mmol/L (ref 20–32)
Calcium: 9.3 mg/dL (ref 8.6–10.3)
Chloride: 105 mmol/L (ref 98–110)
Creat: 0.95 mg/dL (ref 0.70–1.18)
Globulin: 2.9 g/dL (calc) (ref 1.9–3.7)
Glucose, Bld: 102 mg/dL — ABNORMAL HIGH (ref 65–99)
Potassium: 4.8 mmol/L (ref 3.5–5.3)
Sodium: 139 mmol/L (ref 135–146)
Total Bilirubin: 0.7 mg/dL (ref 0.2–1.2)
Total Protein: 7.1 g/dL (ref 6.1–8.1)

## 2020-08-15 LAB — THYROID PANEL WITH TSH
Free Thyroxine Index: 1.9 (ref 1.4–3.8)
T3 Uptake: 39 % — ABNORMAL HIGH (ref 22–35)
T4, Total: 4.8 ug/dL — ABNORMAL LOW (ref 4.9–10.5)
TSH: 2.06 mIU/L (ref 0.40–4.50)

## 2020-08-15 LAB — VITAMIN D 25 HYDROXY (VIT D DEFICIENCY, FRACTURES): Vit D, 25-Hydroxy: 48 ng/mL (ref 30–100)

## 2020-08-15 LAB — HIGH SENSITIVITY CRP: hs-CRP: 3.6 mg/L — ABNORMAL HIGH

## 2020-08-15 NOTE — Telephone Encounter (Signed)
Labs are notable for the following:  Vitamin D looks perfect at 48.  CBC looks good with normal white blood cell count.  Blood glucose is in prediabetes range at 102 but A1c looks perfect at 5.4.  C-reactive protein, inflammation marker, is elevated at 3.6.  This can be due to a variety of factors.  In general, regular exercise along with dietary limitation of breads; pastas; cereals; sugars and sweets will improve both the glucose and the CRP.  Would recheck those in about 6 months.  Lipid panel is slightly abnormal with elevated total and LDL cholesterols.  PSA looks good.

## 2020-08-20 ENCOUNTER — Ambulatory Visit: Payer: No Typology Code available for payment source | Admitting: Hematology and Oncology

## 2020-08-21 ENCOUNTER — Ambulatory Visit: Payer: 59 | Admitting: Hematology and Oncology

## 2020-08-26 ENCOUNTER — Ambulatory Visit: Payer: 59 | Admitting: Family Medicine

## 2020-08-27 ENCOUNTER — Other Ambulatory Visit: Payer: Self-pay

## 2020-08-27 ENCOUNTER — Inpatient Hospital Stay: Payer: 59 | Attending: Hematology and Oncology

## 2020-08-27 DIAGNOSIS — I4891 Unspecified atrial fibrillation: Secondary | ICD-10-CM | POA: Diagnosis not present

## 2020-08-27 DIAGNOSIS — I110 Hypertensive heart disease with heart failure: Secondary | ICD-10-CM | POA: Insufficient documentation

## 2020-08-27 DIAGNOSIS — D649 Anemia, unspecified: Secondary | ICD-10-CM | POA: Diagnosis not present

## 2020-08-27 DIAGNOSIS — C9210 Chronic myeloid leukemia, BCR/ABL-positive, not having achieved remission: Secondary | ICD-10-CM | POA: Diagnosis present

## 2020-08-27 DIAGNOSIS — I509 Heart failure, unspecified: Secondary | ICD-10-CM | POA: Diagnosis not present

## 2020-08-27 DIAGNOSIS — Z79899 Other long term (current) drug therapy: Secondary | ICD-10-CM | POA: Diagnosis not present

## 2020-08-27 LAB — CBC WITH DIFFERENTIAL/PLATELET
Abs Immature Granulocytes: 0.02 10*3/uL (ref 0.00–0.07)
Basophils Absolute: 0.1 10*3/uL (ref 0.0–0.1)
Basophils Relative: 1 %
Eosinophils Absolute: 0.2 10*3/uL (ref 0.0–0.5)
Eosinophils Relative: 4 %
HCT: 37.3 % — ABNORMAL LOW (ref 39.0–52.0)
Hemoglobin: 12.7 g/dL — ABNORMAL LOW (ref 13.0–17.0)
Immature Granulocytes: 0 %
Lymphocytes Relative: 41 %
Lymphs Abs: 2.1 10*3/uL (ref 0.7–4.0)
MCH: 30.7 pg (ref 26.0–34.0)
MCHC: 34 g/dL (ref 30.0–36.0)
MCV: 90.1 fL (ref 80.0–100.0)
Monocytes Absolute: 0.7 10*3/uL (ref 0.1–1.0)
Monocytes Relative: 14 %
Neutro Abs: 2.1 10*3/uL (ref 1.7–7.7)
Neutrophils Relative %: 40 %
Platelets: 167 10*3/uL (ref 150–400)
RBC: 4.14 MIL/uL — ABNORMAL LOW (ref 4.22–5.81)
RDW: 14.1 % (ref 11.5–15.5)
WBC: 5.2 10*3/uL (ref 4.0–10.5)
nRBC: 0 % (ref 0.0–0.2)

## 2020-09-01 ENCOUNTER — Other Ambulatory Visit: Payer: Self-pay

## 2020-09-01 ENCOUNTER — Inpatient Hospital Stay: Payer: 59 | Admitting: Hematology and Oncology

## 2020-09-01 ENCOUNTER — Encounter: Payer: Self-pay | Admitting: Hematology and Oncology

## 2020-09-01 DIAGNOSIS — I10 Essential (primary) hypertension: Secondary | ICD-10-CM

## 2020-09-01 DIAGNOSIS — I509 Heart failure, unspecified: Secondary | ICD-10-CM

## 2020-09-01 DIAGNOSIS — C9210 Chronic myeloid leukemia, BCR/ABL-positive, not having achieved remission: Secondary | ICD-10-CM | POA: Diagnosis not present

## 2020-09-01 NOTE — Assessment & Plan Note (Signed)
He has history of congestive heart failure Clinically, he has no signs of exacerbation of heart failure on exam today I discussed with the patient the importance of aggressive risk factor modification

## 2020-09-01 NOTE — Assessment & Plan Note (Signed)
He has poorly controlled hypertension and have history of atrial fibrillation I recommend vigilant blood pressure control at home and aggressive management of cardiovascular risk factors 

## 2020-09-01 NOTE — Assessment & Plan Note (Signed)
Unfortunately, it has only been 5 days since his blood work CBC is closely within normal limits except for chronic anemia which is stable We will call him with test results I will schedule future visit with at least 10 days interval between lab draw and appointment to ensure we have the results when I see him

## 2020-09-01 NOTE — Progress Notes (Signed)
[facility] OFFICE PROGRESS NOTE  Patient Care Team: [place], Legrand Como, MD as PCP - General (Family Medicine) [name], MD as PCP - Cardiology (Cardiology) Marica Otter, OD (Optometry)  ASSESSMENT & PLAN:  Chronic myelogenous leukemia [facility]) Unfortunately, it has only been 5 days since his blood work CBC is closely within normal limits except for chronic anemia which is stable We will call him with test results I will schedule future visit with at least 10 days interval between lab draw and appointment to ensure we have the results when I see him  White coat syndrome with hypertension Larry Vasquez has poorly controlled hypertension and have history of atrial fibrillation I recommend vigilant blood pressure control at home and aggressive management of cardiovascular risk factors  CHF (congestive heart failure) ([facility]) Larry Vasquez has history of congestive heart failure Clinically, Larry Vasquez has no signs of exacerbation of heart failure on exam today I discussed with the patient the importance of aggressive risk factor modification  Orders Placed This Encounter  Procedures   CBC with Differential/Platelet    Standing Status:   Standing    Number of Occurrences:   22    Standing Expiration Date:   09/01/2021   Comprehensive metabolic panel    Standing Status:   Standing    Number of Occurrences:   33    Standing Expiration Date:   09/01/2021   BCR-ABL    With RT-PCR technique    Standing Status:   Standing    Number of Occurrences:   22    Standing Expiration Date:   09/01/2021    All questions were answered. The patient knows to call the clinic with any problems, questions or concerns. The total time spent in the appointment was 20 minutes encounter with patients including review of chart and various tests results, discussions about plan of care and coordination of care plan   Heath Lark, MD 09/01/2020 9:02 AM  INTERVAL HISTORY: Please see below for problem oriented charting. Larry Vasquez  returns for further follow-up Larry Vasquez is doing well No new side effects from treatment No recent infection Larry Vasquez only missed 2 doses of Sprycel right before Father's Day Larry Vasquez has chronic right ankle swelling which is unchanged No recent shortness of breath, cough or fluid retention  SUMMARY OF ONCOLOGIC HISTORY: Oncology History Overview Note  Physical    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[facility])  11/01/2012 Bone Marrow Biopsy   Bone marrow aspirate and biopsy was performed for leukocytosis and thrombocytosis. 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    11/07/2012 - 08/09/2013 Chemotherapy   Larry Vasquez was started on Gleevec 400 mg daily.    08/09/2013 Progression   Gleevec was discontinued due to a rising white blood cell count and positive ABL Kinase mutation study, 100% positive to E255V mutation    08/09/2013 - 08/21/2013 Chemotherapy   His therapy is switched to hydroxyurea 1000 mg daily pending approval for Dasatinib.    08/22/2013 -  Chemotherapy   Larry Vasquez is started on dasatinib.    11/19/2013 Tumor Marker   BCR/ABL is improving    02/22/2014 Pathology Results   BCR/ABL continues to improve and the patient is moving towards molecular remission    05/27/2014 Pathology Results   BCR/ABL b2a2 & b3a2 both are 0.05%, b2a2 & b3a2 IS both are 0.028%. Patient in MMR    09/26/2014 Pathology Results   Repeat BCR/ABL was not detectable    02/28/2015 Tumor Marker   Repeat BCR/ABL was not detectable  08/28/2015 Tumor Marker   Repeat BCR/ABL was not detectable   01/06/2016 Pathology Results   Repeat BCR/ABL was not detectable   04/15/2016 Pathology Results   Repeat BCR/ABL was not detectable   07/22/2016 Pathology Results   Repeat BCR/ABL was not detectable   11/29/2016 Pathology Results   Repeat BCR/ABL was not detectable   05/30/2017 Pathology Results   Repeat BCR/ABL was not detectable   11/29/2017 Pathology Results   Repeat BCR/ABL was not detectable   10/02/2018 Pathology Results   BCR/ABL is  not detectable.   03/23/2019 Pathology Results   BCR/ABL is not detectable   09/14/2019 Pathology Results   BCR/ABL is not detectable   02/15/2020 Pathology Results   BCR/ABL is not detectable     REVIEW OF SYSTEMS:   Constitutional: Denies fevers, chills or abnormal weight loss Eyes: Denies blurriness of vision Ears, nose, mouth, throat, and face: Denies mucositis or sore throat Respiratory: Denies cough, dyspnea or wheezes Cardiovascular: Denies palpitation, chest discomfort or lower extremity swelling Gastrointestinal:  Denies nausea, heartburn or change in bowel habits Skin: Denies abnormal skin rashes Lymphatics: Denies new lymphadenopathy or easy bruising Neurological:Denies numbness, tingling or new weaknesses Behavioral/Psych: Mood is stable, no new changes  All other systems were reviewed with the patient and are negative.  I have reviewed the past medical history, past surgical history, social history and family history with the patient and they are unchanged from previous note.  ALLERGIES:  is allergic to corticosteroids.  MEDICATIONS:  Current Outpatient Medications  Medication Sig Dispense Refill   acetaminophen (TYLENOL) 500 MG tablet Take 1,000 mg by mouth every 6 (six) hours as needed for moderate pain or headache.      APPLE CIDER VINEGAR PO Take 15 mLs by mouth daily.      BLACK CURRANT SEED OIL PO Take by mouth.     Cholecalciferol (VITAMIN D PO) Take 5,000 Units by mouth daily.     Coenzyme Q10 (CO Q-10) 100 MG CAPS Take 100 mg by mouth daily.     Digestive Enzymes (DIGESTIVE ENZYME PO) Take 1 tablet by mouth as needed.     Garlic 607 MG TABS Take by mouth.     losartan (COZAAR) 25 MG tablet TAKE 1 TABLET (25 MG TOTAL) BY MOUTH DAILY. 90 tablet 0   metoprolol (TOPROL-XL) 200 MG 24 hr tablet TAKE 1 TABLET BY MOUTH EVERY DAY 90 tablet 3   MILK THISTLE PO Take 1 capsule by mouth 3 (three) times a week.      Multiple Vitamin (MULTIVITAMIN WITH MINERALS) TABS  tablet Take 1 tablet by mouth daily.     Multiple Vitamins-Minerals (ZINC PO) Take 1 tablet by mouth 3 (three) times a week.     OVER THE COUNTER MEDICATION Take 1 packet by mouth at bedtime. NATURAL CALM MAGNESIUM POWDER-MIXES IN DRINK     PUMPKIN SEED PO Take by mouth.     Selenium 200 MCG CAPS Take by mouth.     SPRYCEL 50 MG tablet TAKE 1 TABLET BY MOUTH ONCE DAILY AT THE SAME TIME. MAY TAKE WITH OR WITHOUT FOOD. SWALLOW WHOLE. AVOID GRAPEFRUIT PRODUCTS. 30 tablet 11   Taurine 1000 MG CAPS Take by mouth.     TURMERIC CURCUMIN PO Take 1-2 capsules by mouth daily.      No current facility-administered medications for this visit.    PHYSICAL EXAMINATION: ECOG PERFORMANCE STATUS: 1 - Symptomatic but completely ambulatory  Vitals:   09/01/20 0829  BP: (!) 189/62  Pulse: 63  Resp: 18  Temp: (!) 97.4 F (36.3 C)  SpO2: 100%   Filed Weights   09/01/20 0829  Weight: 214 lb 3.2 oz (97.2 kg)    GENERAL:alert, no distress and comfortable SKIN: skin color, texture, turgor are normal, no rashes or significant lesions EYES: normal, Conjunctiva are pink and non-injected, sclera clear OROPHARYNX:no exudate, no erythema and lips, buccal mucosa, and tongue normal  NECK: supple, thyroid normal size, non-tender, without nodularity LYMPH:  no palpable lymphadenopathy in the cervical, axillary or inguinal LUNGS: clear to auscultation and percussion with normal breathing effort HEART: regular rate & rhythm and no murmurs with right ankle edema ABDOMEN:abdomen soft, non-tender and normal bowel sounds Musculoskeletal:no cyanosis of digits and no clubbing  NEURO: alert & oriented x 3 with fluent speech, no focal motor/sensory deficits  LABORATORY DATA:  I have reviewed the data as listed    Component Value Date/Time   NA 139 08/14/2020 0859   NA 139 11/29/2016 0829   K 4.8 08/14/2020 0859   K 4.3 11/29/2016 0829   CL 105 08/14/2020 0859   CO2 28 08/14/2020 0859   CO2 24 11/29/2016 0829    GLUCOSE 102 (H) 08/14/2020 0859   GLUCOSE 101 11/29/2016 0829   BUN 23 08/14/2020 0859   BUN 17.0 11/29/2016 0829   CREATININE 0.95 08/14/2020 0859   CREATININE 0.9 11/29/2016 0829   CALCIUM 9.3 08/14/2020 0859   CALCIUM 9.1 11/29/2016 0829   PROT 7.1 08/14/2020 0859   PROT 7.0 11/29/2016 0829   ALBUMIN 3.7 02/15/2020 0815   ALBUMIN 3.8 11/29/2016 0829   AST 14 08/14/2020 0859   AST 19 09/25/2018 0813   AST 17 11/29/2016 0829   ALT 10 08/14/2020 0859   ALT 16 09/25/2018 0813   ALT 16 11/29/2016 0829   ALKPHOS 64 02/15/2020 0815   ALKPHOS 63 11/29/2016 0829   BILITOT 0.7 08/14/2020 0859   BILITOT 0.7 09/25/2018 0813   BILITOT 0.83 11/29/2016 0829   GFRNONAA >60 02/15/2020 0815   GFRNONAA >60 09/25/2018 0813   GFRAA >60 09/14/2019 0800   GFRAA >60 09/25/2018 0813    No results found for: SPEP, UPEP  Lab Results  Component Value Date   WBC 5.2 08/27/2020   NEUTROABS 2.1 08/27/2020   HGB 12.7 (L) 08/27/2020   HCT 37.3 (L) 08/27/2020   MCV 90.1 08/27/2020   PLT 167 08/27/2020      Chemistry      Component Value Date/Time   NA 139 08/14/2020 0859   NA 139 11/29/2016 0829   K 4.8 08/14/2020 0859   K 4.3 11/29/2016 0829   CL 105 08/14/2020 0859   CO2 28 08/14/2020 0859   CO2 24 11/29/2016 0829   BUN 23 08/14/2020 0859   BUN 17.0 11/29/2016 0829   CREATININE 0.95 08/14/2020 0859   CREATININE 0.9 11/29/2016 0829      Component Value Date/Time   CALCIUM 9.3 08/14/2020 0859   CALCIUM 9.1 11/29/2016 0829   ALKPHOS 64 02/15/2020 0815   ALKPHOS 63 11/29/2016 0829   AST 14 08/14/2020 0859   AST 19 09/25/2018 0813   AST 17 11/29/2016 0829   ALT 10 08/14/2020 0859   ALT 16 09/25/2018 0813   ALT 16 11/29/2016 0829   BILITOT 0.7 08/14/2020 0859   BILITOT 0.7 09/25/2018 0813   BILITOT 0.83 11/29/2016 0829

## 2020-09-02 ENCOUNTER — Telehealth: Payer: Self-pay

## 2020-09-02 LAB — BCR/ABL

## 2020-09-02 NOTE — Telephone Encounter (Signed)
-----   Message from Heath Lark, MD sent at 09/02/2020 10:37 AM EDT ----- Let him know he is in remission BCR/ABL not detected Scheduler will call for 6 months appt

## 2020-09-02 NOTE — Telephone Encounter (Signed)
Called and left below message. Ask him to call the office back for questions. 

## 2020-09-03 ENCOUNTER — Telehealth: Payer: Self-pay | Admitting: Hematology and Oncology

## 2020-09-03 NOTE — Telephone Encounter (Signed)
Scheduled per 6/27 los. Called pt and left a msg

## 2020-10-06 ENCOUNTER — Ambulatory Visit: Payer: 59 | Admitting: Cardiology

## 2020-10-06 ENCOUNTER — Encounter: Payer: Self-pay | Admitting: Cardiology

## 2020-10-06 ENCOUNTER — Other Ambulatory Visit: Payer: Self-pay

## 2020-10-06 ENCOUNTER — Encounter: Payer: Self-pay | Admitting: *Deleted

## 2020-10-06 ENCOUNTER — Telehealth (HOSPITAL_COMMUNITY): Payer: Self-pay | Admitting: *Deleted

## 2020-10-06 VITALS — BP 140/84 | HR 61 | Ht 74.0 in | Wt 213.8 lb

## 2020-10-06 DIAGNOSIS — I480 Paroxysmal atrial fibrillation: Secondary | ICD-10-CM | POA: Diagnosis not present

## 2020-10-06 DIAGNOSIS — I10 Essential (primary) hypertension: Secondary | ICD-10-CM | POA: Diagnosis not present

## 2020-10-06 DIAGNOSIS — R931 Abnormal findings on diagnostic imaging of heart and coronary circulation: Secondary | ICD-10-CM | POA: Diagnosis not present

## 2020-10-06 MED ORDER — ASPIRIN EC 81 MG PO TBEC: 81.0000 mg | DELAYED_RELEASE_TABLET | Freq: Every day | ORAL | 3 refills | Status: DC

## 2020-10-06 NOTE — Progress Notes (Signed)
Cardiology Office Note:    Date:  10/06/2020   ID:  Larry Vasquez, DOB [birth year], MRN [id number]  PCP:  Eunice Blase, MD   [facility] Providers Cardiologist:  Candee Furbish, MD     Referring MD: [name], MD  History of Present Illness:    Larry Vasquez is a 70 y.o. male here for follow-up of coronary artery calcification, atrial fibrillation post ablation by Dr. Curt Bears in 2018 with previous ejection fraction of 35 to 40% thought to be tachycardia mediated with resolution of 55% on repeat echo.  Has had a degree of whitecoat hypertension in the past.  Enjoys golf walking the course without any anginal symptoms.  No recurrence of atrial fibrillation.  At prior visit in 2021 prior to his urologic procedure, we stopped his Eliquis since he has not had any recurrence of atrial fibrillation since 2018.  He is also being treated for chronic myelogenous leukemia with Dr. Alvy Bimler.  He is currently in remission and has had acquired intermittent pancytopenia due to treatment.  Past Medical History:  Diagnosis Date   Anemia    Cataract    Left eye   CML (chronic myelocytic leukemia) (HCC)    Dysrhythmia    Leukocytosis, unspecified 10/17/2012   Thrombocytosis 10/17/2012   White coat hypertension 06/06/2014    Past Surgical History:  Procedure Laterality Date   ATRIAL FIBRILLATION ABLATION [name] 10/08/2016   Procedure: Atrial Fibrillation Ablation;  Surgeon: Constance Haw, MD;  Location: [place] [facility];  Service: Cardiovascular;  Laterality: [name];   CARDIOVERSION [name] 06/24/2016   Procedure: CARDIOVERSION;  Surgeon: [name], MD;  Location: [place];  Service: Cardiovascular;  Laterality: [name];   CATARACT EXTRACTION Left 01/01/2013   EYE SURGERY  2012   Detached Retina   HERNIA REPAIR     HOLEP-LASER ENUCLEATION OF THE PROSTATE WITH MORCELLATION [name] 07/09/2019   Procedure: HOLEP-LASER ENUCLEATION OF THE PROSTATE WITH MORCELLATION;  Surgeon: Hollice Espy, MD;   Location: [facility];  Service: Urology;  Laterality: [name];   JOINT REPLACEMENT  2006   Total Knee Left   TONSILLECTOMY      Current Medications: Current Meds  Medication Sig   acetaminophen (TYLENOL) 500 MG tablet Take 1,000 mg by mouth every 6 (six) hours as needed for moderate pain or headache.    APPLE CIDER VINEGAR PO Take 15 mLs by mouth daily.    aspirin EC 81 MG tablet Take 1 tablet (81 mg total) by mouth daily. Swallow whole.   BLACK CURRANT SEED OIL PO Take by mouth.   Cholecalciferol (VITAMIN D PO) Take 5,000 Units by mouth daily.   Coenzyme Q10 (CO Q-10) 100 MG CAPS Take 100 mg by mouth daily.   Digestive Enzymes (DIGESTIVE ENZYME PO) Take 1 tablet by mouth as needed.   Garlic [id number] MG TABS Take by mouth.   losartan (COZAAR) 25 MG tablet TAKE 1 TABLET (25 MG TOTAL) BY MOUTH DAILY.   metoprolol (TOPROL-XL) 200 MG 24 hr tablet TAKE 1 TABLET BY MOUTH EVERY DAY   MILK THISTLE PO Take 1 capsule by mouth 3 (three) times a week.    Multiple Vitamin (MULTIVITAMIN WITH MINERALS) TABS tablet Take 1 tablet by mouth daily.   Multiple Vitamins-Minerals (ZINC PO) Take 1 tablet by mouth 3 (three) times a week.   OVER THE COUNTER MEDICATION Take 1 packet by mouth at bedtime. NATURAL CALM MAGNESIUM POWDER-MIXES IN DRINK   PUMPKIN SEED PO Take by mouth.   Selenium 200  MCG CAPS Take by mouth.   SPRYCEL 50 MG tablet TAKE 1 TABLET BY MOUTH ONCE DAILY AT THE SAME TIME. MAY TAKE WITH OR WITHOUT FOOD. SWALLOW WHOLE. AVOID GRAPEFRUIT PRODUCTS.   Taurine 1000 MG CAPS Take by mouth.   TURMERIC CURCUMIN PO Take 1-2 capsules by mouth daily.      Allergies:   Corticosteroids   Social History   Socioeconomic History   Marital status: Married    Spouse name: Not on file   Number of children: Not on file   Years of education: Not on file   Highest education level: Not on file  Occupational History   Not on file  Tobacco Use   Smoking status: Never   Smokeless tobacco: Never  Vaping Use   Vaping  Use: Never used  Substance and Sexual Activity   Alcohol use: Yes    Alcohol/week: 7.0 standard drinks    Types: 7 Glasses of wine per week    Comment: daily wine    Drug use: No   Sexual activity: Yes    Birth control/protection: None  Other Topics Concern   Not on file  Social History Narrative   Not on file   Social Determinants of Health   Financial Resource Strain: Not on file  Food Insecurity: Not on file  Transportation Needs: Not on file  Physical Activity: Not on file  Stress: Not on file  Social Connections: Not on file     Family History: The patient's family history includes Arthritis in his mother; Atrial fibrillation in his father; Bladder Cancer in his father; CAD in his father; Cancer in his father; Chronic Renal Failure in his father; Diabetes in his father; Healthy in his brother and brother; Hypertension in his father and mother.  ROS:   Please see the history of present illness.     All other systems reviewed and are negative.  EKGs/Labs/Other Studies Reviewed:    The following studies were reviewed today: IMPRESSION: 1) Dilated LAA no thrombus on delayed contrast images   2) Severe bi atrial enlargement   3) Normal pulmonary vein anatomy measurements as above   4) Mild aortic root dilatation 4.2 cm   5) Calcium score 2746 with dense calcium in LAD and RCA this is 97th percentile for age and sex. Consider f/u perfusion study especially given known decreased LV function   [place]  EKG:  EKG is  ordered today.  The ekg ordered today demonstrates sinus rhythm 61 no other abnormalities  Recent Labs: 08/14/2020: ALT 10; BUN 23; Creat 0.95; Potassium 4.8; Sodium 139; TSH 2.06 08/27/2020: Hemoglobin 12.7; Platelets 167  Recent Lipid Panel    Component Value Date/Time   CHOL 231 (H) 08/14/2020 0859   TRIG 88 08/14/2020 0859   HDL 56 08/14/2020 0859   CHOLHDL 4.1 08/14/2020 0859   LDLCALC 156 (H) 08/14/2020 0859     Risk  Assessment/Calculations:          Physical Exam:    VS:  BP 140/84   Pulse 61   Ht '6\' 2"'$  (1.88 m)   Wt 213 lb 12.8 oz (97 kg)   SpO2 98%   BMI 27.45 kg/m     Wt Readings from Last 3 Encounters:  10/06/20 213 lb 12.8 oz (97 kg)  09/01/20 214 lb 3.2 oz (97.2 kg)  08/14/20 216 lb 6.4 oz (98.2 kg)     GEN:  Well nourished, well developed in no acute distress HEENT: Normal NECK: No JVD;  No carotid bruits LYMPHATICS: No lymphadenopathy CARDIAC: RRR, no murmurs, rubs, gallops RESPIRATORY:  Clear to auscultation without rales, wheezing or rhonchi  ABDOMEN: Soft, non-tender, non-distended MUSCULOSKELETAL:  No edema; No deformity  SKIN: Warm and dry NEUROLOGIC:  Alert and oriented x 3 PSYCHIATRIC:  Normal affect   ASSESSMENT:    1. PAF (paroxysmal atrial fibrillation) ([place])   2. Essential hypertension   3. Elevated coronary artery calcium score   4. Abnormal findings diagnostic imaging of heart and coronary circulation    PLAN:    In order of problems listed above:  Coronary atherosclerosis/coronary calcium - Abnormal CT of heart reviewed from prior coronary calcium score prior to ablation.  2600.  National recommendation/ACC recommendation is to proceed with perfusion testing for further restratification.  We will go ahead and get him set up for a nuclear stress test, treadmill if possible.  1 in 10,000 overall risk of study.  I have also started him on aspirin 81 mg.  I have encouraged him to take Crestor however he is hesitant at this time.  I have offered him lipid clinic referral to discuss as well.  We will continue.  With a coronary calcium score of 2600, 97 percentile, this increases his overall long-term cardiovascular risks over the next 10 years.  Paroxysmal atrial fibrillation status post ablation 2018 - Dr. Curt Bears performed ablation.  No recurrence of atrial fibrillation. - He is off of anticoagulation with Eliquis.  Obviously if atrial fibrillation were to  return, he would need to resume to reduce his stroke risk. - Continue with medical management with metoprolol XL 200 mg once a day and losartan 25 mg a day.  Refills as needed.  Prior tachycardia induced cardiomyopathy - EF improved from 35% back to normal to 55% after therapy for atrial fibrillation.  Continuing with both metoprolol 200 mg daily and angiotensin receptor blocker low-dose losartan 25 mg a day.  Chronic myelogenous leukemia - Oncology note reviewed.  Continuing with Sprycel 50 mg a day.  Occasional pancytopenia.  Asymptomatic.  Status post TURP - Urology.  Hypertension - Continue to monitor.  Likely has a whitecoat component.  At home has been fairly reasonable.       Medication Adjustments/Labs and Tests Ordered: Current medicines are reviewed at length with the patient today.  Concerns regarding medicines are outlined above.  Orders Placed This Encounter  Procedures   Cardiac Stress Test: Informed Consent Details: Physician/Practitioner Attestation; Transcribe to consent form and obtain patient signature   MYOCARDIAL PERFUSION IMAGING   EKG 12-Lead    Meds ordered this encounter  Medications   aspirin EC 81 MG tablet    Sig: Take 1 tablet (81 mg total) by mouth daily. Swallow whole.    Dispense:  90 tablet    Refill:  3     Patient Instructions  Medication Instructions:  Please start Asprin 81 mg a day. Continue all other prescription medications as listed.  *If you need a refill on your cardiac medications before your next appointment, please call your pharmacy*  Testing/Procedures: Your physician has requested that you have a myoview stress test. Please follow instruction sheet, as given.   Follow-Up: At [facility], you and your health needs are our priority.  As part of our continuing mission to provide you with exceptional heart care, we have created designated Provider Care Teams.  These Care Teams include your primary Cardiologist  (physician) and Advanced Practice Providers (APPs -  Physician Assistants and Nurse Practitioners) who all  work together to provide you with the care you need, when you need it.  We recommend signing up for the patient portal called "MyChart".  Sign up information is provided on this After Visit Summary.  MyChart is used to connect with patients for Virtual Visits (Telemedicine).  Patients are able to view lab/test results, encounter notes, upcoming appointments, etc.  Non-urgent messages can be sent to your provider as well.   To learn more about what you can do with MyChart, go to [email or web address].    Your next appointment:   6 month(s)  The format for your next appointment:   In Person  Provider:   Candee Furbish, MD   Thank you for choosing [facility]!!     Signed, Candee Furbish, MD  10/06/2020 9:17 AM    [place]

## 2020-10-06 NOTE — Telephone Encounter (Signed)
Patient given detailed instructions per Myocardial Perfusion Study Information Sheet for the test on 10/13/20 at 7:30. Patient notified to arrive 15 minutes early and that it is imperative to arrive on time for appointment to keep from having the test rescheduled.  If you need to cancel or reschedule your appointment, please call the office within 24 hours of your appointment. . Patient verbalized understanding.Larry Vasquez

## 2020-10-06 NOTE — Patient Instructions (Signed)
Medication Instructions:  Please start Asprin 81 mg a day. Continue all other prescription medications as listed.  *If you need a refill on your cardiac medications before your next appointment, please call your pharmacy*  Testing/Procedures: Your physician has requested that you have a myoview stress test. Please follow instruction sheet, as given.   Follow-Up: At [facility], you and your health needs are our priority.  As part of our continuing mission to provide you with exceptional heart care, we have created designated Provider Care Teams.  These Care Teams include your primary Cardiologist (physician) and Advanced Practice Providers (APPs -  Physician Assistants and Nurse Practitioners) who all work together to provide you with the care you need, when you need it.  We recommend signing up for the patient portal called "MyChart".  Sign up information is provided on this After Visit Summary.  MyChart is used to connect with patients for Virtual Visits (Telemedicine).  Patients are able to view lab/test results, encounter notes, upcoming appointments, etc.  Non-urgent messages can be sent to your provider as well.   To learn more about what you can do with MyChart, go to [email or web address].    Your next appointment:   6 month(s)  The format for your next appointment:   In Person  Provider:   Candee Furbish, MD   Thank you for choosing [facility]!!

## 2020-10-09 ENCOUNTER — Telehealth (HOSPITAL_COMMUNITY): Payer: Self-pay | Admitting: Cardiology

## 2020-10-09 NOTE — Telephone Encounter (Signed)
Patient cancelled Myoview for reason below:  10/09/2020 9:57 AM [id number], Larry Vasquez  Cancel Rsn: Patient (patient will call back to r/s)  Order will  be removed from the active [place] and when patient calls back to reschedule we will reinstate the order. Thank you.

## 2020-10-10 ENCOUNTER — Ambulatory Visit: Payer: 59 | Admitting: Family Medicine

## 2020-10-10 ENCOUNTER — Encounter: Payer: Self-pay | Admitting: Family Medicine

## 2020-10-10 ENCOUNTER — Other Ambulatory Visit: Payer: Self-pay

## 2020-10-10 DIAGNOSIS — M25571 Pain in right ankle and joints of right foot: Secondary | ICD-10-CM

## 2020-10-10 NOTE — Progress Notes (Signed)
Office Visit Note   Patient: Larry Vasquez           Date of Birth: 1950/03/31           MRN: [id number] Visit Date: 10/10/2020 Requested by: Eunice Blase, MD [address] [place],  [place] [address] PCP: Eunice Blase, MD  Subjective: Chief Complaint  Patient presents with   Right Ankle - Pain, Follow-up    Continues to have swelling and pain in the ankle.     HPI: He is here with continued right ankle pain.  Medial pain when doing a lot of walking.              ROS:   All other systems were reviewed and are negative.  Objective: Vital Signs: There were no vitals taken for this visit.  Physical Exam:  General:  Alert and oriented, in no acute distress. Pulm:  Breathing unlabored. Psy:  Normal mood, congruent affect. Skin: He has 1+ pitting edema in his right lower extremity. Right ankle: He has hyperpronation with longitudinal arch collapse.  Posterior tibial tendon function is still seems to be intact with calf raise.  He is tender to palpation along the posterior tibial tendon.  Imaging: No results found.  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 -Could contemplate physical therapy iontophoresis although he had a reaction to cortisone injection in the past and has not had any other steroid since then. -Dextrose prolotherapy would be a good consideration if and when we are able to get dextrose again.     Procedures: No procedures performed        PMFS History: Patient Active Problem List   Diagnosis Date Noted   Preventive measure 02/22/2020   BPH with urinary obstruction 07/09/2019   Primary osteoarthritis of right knee 11/21/2017   History of atrial fibrillation 01/25/2017   CML (chronic myelocytic leukemia) ([place])    Cataract    Hematoma 10/11/2016   Status post catheter ablation of atrial flutter 10/09/2016   Status  post catheter ablation of atrial fibrillation 10/09/2016   AF (atrial fibrillation) ([place]) 10/08/2016   CHF (congestive heart failure) ([place]) 05/25/2016   Atrial fibrillation, new onset ([place]) 05/25/2016   Essential (primary) hypertension 05/25/2016   Atrial fibrillation ([place]) 05/25/2016   Atrial fibrillation with RVR ([place])    Prostate cancer screening 10/22/2015   Routine health maintenance 10/22/2015   BPH with elevated PSA 10/16/2015   White coat syndrome with hypertension 06/06/2014   Spells 11/28/2013   Leukopenia due to antineoplastic chemotherapy ([place]) 11/28/2013   Pancytopenia due to antineoplastic chemotherapy ([place]) 08/09/2013   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[place]) 01/30/2013   Thrombocytosis 10/17/2012   Past Medical History:  Diagnosis Date   Anemia    Cataract    Left eye   CML (chronic myelocytic leukemia) (HCC)    Dysrhythmia    Leukocytosis, unspecified 10/17/2012   Thrombocytosis 10/17/2012   White coat hypertension 06/06/2014    Family History  Problem Relation Age of Onset   Arthritis Mother    Hypertension Mother    Diabetes Father    Hypertension Father    Cancer Father        bladder ca   CAD Father        CABG   Chronic Renal Failure Father    Atrial fibrillation Father    Bladder Cancer Father    Healthy Brother  Healthy Brother     Past Surgical History:  Procedure Laterality Date   ATRIAL FIBRILLATION ABLATION [name] 10/08/2016   Procedure: Atrial Fibrillation Ablation;  Surgeon: Constance Haw, MD;  Location: [place] [facility];  Service: Cardiovascular;  Laterality: [name];   CARDIOVERSION [name] 06/24/2016   Procedure: CARDIOVERSION;  Surgeon: [name], MD;  Location: [place];  Service: Cardiovascular;  Laterality: [name];   CATARACT EXTRACTION Left 01/01/2013   EYE SURGERY  2012   Detached Retina   HERNIA REPAIR     HOLEP-LASER ENUCLEATION OF THE PROSTATE WITH MORCELLATION [name] 07/09/2019   Procedure: HOLEP-LASER ENUCLEATION OF THE PROSTATE  WITH MORCELLATION;  Surgeon: Hollice Espy, MD;  Location: [facility];  Service: Urology;  Laterality: [name];   JOINT REPLACEMENT  2006   Total Knee Left   TONSILLECTOMY     Social History   Occupational History   Not on file  Tobacco Use   Smoking status: Never   Smokeless tobacco: Never  Vaping Use   Vaping Use: Never used  Substance and Sexual Activity   Alcohol use: Yes    Alcohol/week: 7.0 standard drinks    Types: 7 Glasses of wine per week    Comment: daily wine    Drug use: No   Sexual activity: Yes    Birth control/protection: None

## 2020-10-13 ENCOUNTER — Encounter (HOSPITAL_COMMUNITY): Payer: 59

## 2020-10-14 ENCOUNTER — Encounter: Payer: Self-pay | Admitting: Family Medicine

## 2020-10-21 ENCOUNTER — Telehealth: Payer: Self-pay | Admitting: *Deleted

## 2020-10-21 DIAGNOSIS — R931 Abnormal findings on diagnostic imaging of heart and coronary circulation: Secondary | ICD-10-CM

## 2020-10-21 DIAGNOSIS — Z01812 Encounter for preprocedural laboratory examination: Secondary | ICD-10-CM

## 2020-10-21 NOTE — Telephone Encounter (Signed)
Stress MRI with CBC ordered.  Pt instructions sent via MyChart.  Pt is aware he will contacted to be scheduled.

## 2020-10-21 NOTE — Telephone Encounter (Signed)
Unfortunately cannot do an MRA with a stress MRI, since both require imaging at the time the contrast is given, so would need to do just a stress MRI.    Fritzi Mandes, Cleone Slim, RN  You; [name], MD; Nuala Alpha, LPN; Donato Heinz, MD Yesterday (7:41 AM)   Looking at his chart maybe a MRI/MRA since he has an enlarged aorta?  Victorio Palm, MD  You; Nuala Alpha, LPN; Lorenza Evangelist, RN; Donato Heinz, MD Yesterday (7:24 AM)   Absolutely  Let's order a cardiac stress MRI. Great suggestion.  Candee Furbish, MD    Nuala Alpha, LPN routed conversation to You; [name], MD 4 days ago   Dipesh, Pfohl "Doug"  [place] Triage (supporting [name], MD) 4 days ago   Dr. Marlou Porch,  It was good seeing you last week. Thank you again for all your time and professional advice . After educating myself regarding the Myocardial Perfusion procedure and reviewing all the different Imaging tests available on the [facility] website, I  have decided to postpone the Myocardial Perfusion test at this time. I have concern about injecting radioactive material into my body as well as the Box drug , especially with my current CML diagnosis. I was hoping you would consider a noninvasive procedure, such as a Cardiac MRI, as noted on [facility] website,  that apparently doesn't use Ionizing radiation or carry any cancer causing risk? Thank you for your possible consideration. I trust you can understand my concern. Thank you, Marden Noble

## 2020-11-11 ENCOUNTER — Other Ambulatory Visit: Payer: Self-pay | Admitting: Cardiology

## 2020-12-04 ENCOUNTER — Other Ambulatory Visit (HOSPITAL_COMMUNITY): Payer: Self-pay

## 2020-12-10 ENCOUNTER — Telehealth: Payer: Self-pay | Admitting: Family Medicine

## 2020-12-10 NOTE — Telephone Encounter (Signed)
Per notes - pt to c/b if and/or when he decides to complete the stress MRI.

## 2020-12-10 NOTE — Telephone Encounter (Signed)
Received request for records from [facility]. I emailed.

## 2020-12-30 ENCOUNTER — Telehealth: Payer: Self-pay | Admitting: Pharmacist

## 2020-12-30 NOTE — Telephone Encounter (Signed)
Left message for pt to discuss. He has never tried any lipid lowering medication per our records. LDL is 156 at baseline, goal < 70 given elevated calcium score. He needs to start high intensity statin therapy if willing. Insurance will not cover branded meds unless his LDL is still above goal on max tolerated statin or he's been intolerant to at least 2 statins.

## 2020-12-30 NOTE — Telephone Encounter (Signed)
-----   Message from Larry Pain, MD sent at 12/30/2020  6:59 AM EDT ----- Regarding: FW: Heart scans Thanks for the update. High risk scan. I will forward this to see if he would be willing to sit with our lipid clinic to discuss non statin treatment strategies. I am concerned about his risk of having a cardiac event over the next few years.   Larry Vasquez  ----- Message ----- From: Larry Blase, MD Sent: 12/29/2020   8:59 PM EDT To: Larry Pain, MD Subject: RE: Heart scans                                FYI, the new score is 3,916.  Ronalee Belts ----- Message ----- From: Larry Pain, MD Sent: 12/12/2020   2:58 PM EDT To: Larry Blase, MD Subject: RE: Heart scans                                Larry Vasquez,  I am aware of his reluctance for further testing. Tried NUC stress first - he refused Tried Cardiac MRI stress - he refused No other testing is possible for him without the use of some form of injectable (radiotracer for NUC, gadolinium for MRI, Lexiscan for stress testing, iodinated contrast for further coronary CT to look at lumen of vessels or cardiac cath).  If his calcium score is elevated, would strongly recommend statin (Crestor 20 for instance). He has refused in the past.  Could try PCSK9 like Repatha if insurance approves. Overall those options would be the best at stabilizing his disease.   We have many options for him to clarify if disease is flow limiting (as listed above).  Let me know what the new calcium score shows. Let's see what we can do for him.   Thanks  -Elta Guadeloupe       ----- Message ----- From: Larry Blase, MD Sent: 12/12/2020   9:50 AM EDT To: Larry Pain, MD Subject: Heart scans                                    Hi Dr. Marlou Porch,  [name] is reluctant to have contrast material. I am ordering another calcium score test. If numbers are worse, what would your recommendation be for him? Would it be for stent placement? Or CABG?  Thanks! [facility] [address], [place], [place] [address] [phone] office [phone] fax [phone] mobile

## 2021-01-03 ENCOUNTER — Other Ambulatory Visit: Payer: Self-pay | Admitting: Cardiology

## 2021-01-03 DIAGNOSIS — I4819 Other persistent atrial fibrillation: Secondary | ICD-10-CM

## 2021-01-03 DIAGNOSIS — Z01810 Encounter for preprocedural cardiovascular examination: Secondary | ICD-10-CM

## 2021-01-03 DIAGNOSIS — I10 Essential (primary) hypertension: Secondary | ICD-10-CM

## 2021-01-05 MED ORDER — ROSUVASTATIN CALCIUM 20 MG PO TABS
20.0000 mg | ORAL_TABLET | Freq: Every day | ORAL | 11 refills | Status: DC
Start: 2021-01-05 — End: 2021-04-20

## 2021-01-05 NOTE — Telephone Encounter (Signed)
Called pt again. Initially states he does not want to take a statin and prefers to take OTC medication. Discussed that no OTC options will bring his cholesterol to goal or provide him with any cardiac benefit. Reviewed lipid lowering, CV data with statins. Pt agreeable for me to send in rx for rosuvastatin 20mg  daily, not 100% sure he will start taking it but seems more agreeable than at the beginning of the call. He does not want to schedule follow up labs at this time.

## 2021-01-06 NOTE — Telephone Encounter (Signed)
Thank you for your time working with Larry Vasquez. Candee Furbish, MD

## 2021-02-16 NOTE — Progress Notes (Signed)
Oral Chemotherapy Pharmacist Encounter  Received fax from [facility] [employer] for new prior authorization for Sprycel. Patient is approved until 03/07/2021.   Drema Halon, PharmD Hematology/Oncology Clinical Pharmacist [facility] [phone]

## 2021-02-23 ENCOUNTER — Other Ambulatory Visit: Payer: Self-pay

## 2021-02-23 ENCOUNTER — Inpatient Hospital Stay: Payer: 59 | Attending: Hematology and Oncology

## 2021-02-23 DIAGNOSIS — I10 Essential (primary) hypertension: Secondary | ICD-10-CM

## 2021-02-23 DIAGNOSIS — C9210 Chronic myeloid leukemia, BCR/ABL-positive, not having achieved remission: Secondary | ICD-10-CM | POA: Diagnosis present

## 2021-02-23 DIAGNOSIS — I509 Heart failure, unspecified: Secondary | ICD-10-CM

## 2021-02-23 LAB — CBC WITH DIFFERENTIAL/PLATELET
Abs Immature Granulocytes: 0.01 10*3/uL (ref 0.00–0.07)
Basophils Absolute: 0 10*3/uL (ref 0.0–0.1)
Basophils Relative: 1 %
Eosinophils Absolute: 0.2 10*3/uL (ref 0.0–0.5)
Eosinophils Relative: 4 %
HCT: 36.5 % — ABNORMAL LOW (ref 39.0–52.0)
Hemoglobin: 12.2 g/dL — ABNORMAL LOW (ref 13.0–17.0)
Immature Granulocytes: 0 %
Lymphocytes Relative: 38 %
Lymphs Abs: 1.7 10*3/uL (ref 0.7–4.0)
MCH: 30.2 pg (ref 26.0–34.0)
MCHC: 33.4 g/dL (ref 30.0–36.0)
MCV: 90.3 fL (ref 80.0–100.0)
Monocytes Absolute: 0.6 10*3/uL (ref 0.1–1.0)
Monocytes Relative: 14 %
Neutro Abs: 1.9 10*3/uL (ref 1.7–7.7)
Neutrophils Relative %: 43 %
Platelets: 162 10*3/uL (ref 150–400)
RBC: 4.04 MIL/uL — ABNORMAL LOW (ref 4.22–5.81)
RDW: 14.4 % (ref 11.5–15.5)
WBC: 4.5 10*3/uL (ref 4.0–10.5)
nRBC: 0 % (ref 0.0–0.2)

## 2021-02-23 LAB — COMPREHENSIVE METABOLIC PANEL
ALT: 13 U/L (ref 0–44)
AST: 19 U/L (ref 15–41)
Albumin: 3.7 g/dL (ref 3.5–5.0)
Alkaline Phosphatase: 55 U/L (ref 38–126)
Anion gap: 8 (ref 5–15)
BUN: 23 mg/dL (ref 8–23)
CO2: 23 mmol/L (ref 22–32)
Calcium: 8.6 mg/dL — ABNORMAL LOW (ref 8.9–10.3)
Chloride: 107 mmol/L (ref 98–111)
Creatinine, Ser: 1.14 mg/dL (ref 0.61–1.24)
GFR, Estimated: >60 mL/min (ref >60)
Glucose, Bld: 98 mg/dL (ref 70–99)
Potassium: 4.4 mmol/L (ref 3.5–5.1)
Sodium: 138 mmol/L (ref 135–145)
Total Bilirubin: 0.8 mg/dL (ref 0.3–1.2)
Total Protein: 6.9 g/dL (ref 6.5–8.1)

## 2021-02-27 LAB — BCR/ABL

## 2021-03-05 ENCOUNTER — Encounter: Payer: Self-pay | Admitting: Hematology and Oncology

## 2021-03-05 ENCOUNTER — Other Ambulatory Visit: Payer: Self-pay

## 2021-03-05 ENCOUNTER — Inpatient Hospital Stay: Payer: 59 | Admitting: Hematology and Oncology

## 2021-03-05 DIAGNOSIS — T451X5A Adverse effect of antineoplastic and immunosuppressive drugs, initial encounter: Secondary | ICD-10-CM

## 2021-03-05 DIAGNOSIS — D6481 Anemia due to antineoplastic chemotherapy: Secondary | ICD-10-CM | POA: Diagnosis not present

## 2021-03-05 DIAGNOSIS — C9210 Chronic myeloid leukemia, BCR/ABL-positive, not having achieved remission: Secondary | ICD-10-CM | POA: Diagnosis not present

## 2021-03-05 DIAGNOSIS — I10 Essential (primary) hypertension: Secondary | ICD-10-CM | POA: Diagnosis not present

## 2021-03-05 NOTE — Assessment & Plan Note (Signed)
This is likely due to recent treatment. The patient denies recent history of bleeding such as epistaxis, hematuria or hematochezia. He is asymptomatic from the anemia. I will observe for now.    

## 2021-03-05 NOTE — Progress Notes (Signed)
[place] OFFICE PROGRESS NOTE  Patient Care Team: Fenton, Legrand Como, MD (Inactive) as PCP - General (Family Medicine) [name], MD as PCP - Cardiology (Cardiology) Marica Otter, OD (Optometry)  ASSESSMENT & PLAN:  Chronic myelogenous leukemia [facility]) He has excellent response to treatment and remained in complete response We discussed the risk and benefits of chronic maintenance treatment with low-dose Sprycel He is in agreement I will see him again in 6 months for further follow-up  White coat syndrome with hypertension He has poorly controlled hypertension and have history of atrial fibrillation I recommend vigilant blood pressure control at home and aggressive management of cardiovascular risk factors  Anemia due to antineoplastic chemotherapy This is likely due to recent treatment. The patient denies recent history of bleeding such as epistaxis, hematuria or hematochezia. He is asymptomatic from the anemia. I will observe for now  No orders of the defined types were placed in this encounter.   All questions were answered. The patient knows to call the clinic with any problems, questions or concerns. The total time spent in the appointment was 20 minutes encounter with patients including review of chart and various tests results, discussions about plan of care and coordination of care plan   Heath Lark, MD 03/05/2021 1:21 PM  INTERVAL HISTORY: Please see below for problem oriented charting. he returns for treatment follow-up on Sprycel for CML Is doing well Denies missing doses No recent shortness of breath or cough Denies recent infection, fever or chillsDenies recent infection  REVIEW OF SYSTEMS:   Constitutional: Denies fevers, chills or abnormal weight loss Eyes: Denies blurriness of vision Ears, nose, mouth, throat, and face: Denies mucositis or sore throat Respiratory: Denies cough, dyspnea or wheezes Cardiovascular: Denies palpitation, chest  discomfort or lower extremity swelling Gastrointestinal:  Denies nausea, heartburn or change in bowel habits Skin: Denies abnormal skin rashes Lymphatics: Denies new lymphadenopathy or easy bruising Neurological:Denies numbness, tingling or new weaknesses Behavioral/Psych: Mood is stable, no new changes  All other systems were reviewed with the patient and are negative.  I have reviewed the past medical history, past surgical history, social history and family history with the patient and they are unchanged from previous note.  ALLERGIES:  is allergic to corticosteroids.  MEDICATIONS:  Current Outpatient Medications  Medication Sig Dispense Refill   acetaminophen (TYLENOL) 500 MG tablet Take 1,000 mg by mouth every 6 (six) hours as needed for moderate pain or headache.      APPLE CIDER VINEGAR PO Take 15 mLs by mouth daily.      aspirin EC 81 MG tablet Take 1 tablet (81 mg total) by mouth daily. Swallow whole. 90 tablet 3   BLACK CURRANT SEED OIL PO Take by mouth.     Cholecalciferol (VITAMIN D PO) Take 5,000 Units by mouth daily.     Coenzyme Q10 (CO Q-10) 100 MG CAPS Take 100 mg by mouth daily.     Digestive Enzymes (DIGESTIVE ENZYME PO) Take 1 tablet by mouth as needed.     Garlic 536 MG TABS Take by mouth.     losartan (COZAAR) 25 MG tablet TAKE 1 TABLET (25 MG TOTAL) BY MOUTH DAILY. 90 tablet 3   metoprolol (TOPROL-XL) 200 MG 24 hr tablet TAKE 1 TABLET BY MOUTH EVERY DAY 90 tablet 3   MILK THISTLE PO Take 1 capsule by mouth 3 (three) times a week.      Multiple Vitamin (MULTIVITAMIN WITH MINERALS) TABS tablet Take 1 tablet by mouth  daily.     Multiple Vitamins-Minerals (ZINC PO) Take 1 tablet by mouth 3 (three) times a week.     OVER THE COUNTER MEDICATION Take 1 packet by mouth at bedtime. NATURAL CALM MAGNESIUM POWDER-MIXES IN DRINK     PUMPKIN SEED PO Take by mouth.     rosuvastatin (CRESTOR) 20 MG tablet Take 1 tablet (20 mg total) by mouth daily. 30 tablet 11   Selenium 200  MCG CAPS Take by mouth.     SPRYCEL 50 MG tablet TAKE 1 TABLET BY MOUTH ONCE DAILY AT THE SAME TIME. MAY TAKE WITH OR WITHOUT FOOD. SWALLOW WHOLE. AVOID GRAPEFRUIT PRODUCTS. 30 tablet 11   Taurine 1000 MG CAPS Take by mouth.     TURMERIC CURCUMIN PO Take 1-2 capsules by mouth daily.      No current facility-administered medications for this visit.    SUMMARY OF ONCOLOGIC HISTORY: Oncology History Overview Note  Physical   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[place])  11/01/2012 Bone Marrow Biopsy   Bone marrow aspirate and biopsy was performed for leukocytosis and thrombocytosis. 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   11/07/2012 - 08/09/2013 Chemotherapy   He was started on Gleevec 400 mg daily.   08/09/2013 Progression   Gleevec was discontinued due to a rising white blood cell count and positive ABL Kinase mutation study, 100% positive to E255V mutation   08/09/2013 - 08/21/2013 Chemotherapy   His therapy is switched to hydroxyurea 1000 mg daily pending approval for Dasatinib.   08/22/2013 -  Chemotherapy   He is started on dasatinib.   11/19/2013 Tumor Marker   BCR/ABL is improving   02/22/2014 Pathology Results   BCR/ABL continues to improve and the patient is moving towards molecular remission   05/27/2014 Pathology Results   BCR/ABL b2a2 & b3a2 both are 0.05%, b2a2 & b3a2 IS both are 0.028%. Patient in MMR   09/26/2014 Pathology Results   Repeat BCR/ABL was not detectable   02/28/2015 Tumor Marker   Repeat BCR/ABL was not detectable   08/28/2015 Tumor Marker   Repeat BCR/ABL was not detectable   01/06/2016 Pathology Results   Repeat BCR/ABL was not detectable   04/15/2016 Pathology Results   Repeat BCR/ABL was not detectable   07/22/2016 Pathology Results   Repeat BCR/ABL was not detectable   11/29/2016 Pathology Results   Repeat BCR/ABL was not detectable   05/30/2017 Pathology Results   Repeat BCR/ABL was not detectable   11/29/2017 Pathology Results   Repeat BCR/ABL was not  detectable   10/02/2018 Pathology Results   BCR/ABL is not detectable.   03/23/2019 Pathology Results   BCR/ABL is not detectable   09/14/2019 Pathology Results   BCR/ABL is not detectable   02/15/2020 Pathology Results   BCR/ABL is not detectable   08/27/2020 Pathology Results   BCR/ABL is not detectable   02/23/2021 Pathology Results   Repeat BCR/ABL was not detectable     PHYSICAL EXAMINATION: ECOG PERFORMANCE STATUS: 1 - Symptomatic but completely ambulatory  Vitals:   03/05/21 0825  BP: (!) 195/83  Pulse: (!) 57  Resp: 18  Temp: 97.8 F (36.6 C)  SpO2: 97%   Filed Weights   03/05/21 0825  Weight: 213 lb 9.6 oz (96.9 kg)    GENERAL:alert, no distress and comfortable SKIN: skin color, texture, turgor are normal, no rashes or significant lesions EYES: normal, Conjunctiva are pink and non-injected, sclera clear OROPHARYNX:no exudate, no erythema and lips, buccal mucosa, and tongue normal  NECK: supple,  thyroid normal size, non-tender, without nodularity LYMPH:  no palpable lymphadenopathy in the cervical, axillary or inguinal LUNGS: clear to auscultation and percussion with normal breathing effort HEART: regular rate & rhythm and no murmurs and no lower extremity edema ABDOMEN:abdomen soft, non-tender and normal bowel sounds Musculoskeletal:no cyanosis of digits and no clubbing  NEURO: alert & oriented x 3 with fluent speech, no focal motor/sensory deficits  LABORATORY DATA:  I have reviewed the data as listed    Component Value Date/Time   NA 138 02/23/2021 0811   NA 139 11/29/2016 0829   K 4.4 02/23/2021 0811   K 4.3 11/29/2016 0829   CL 107 02/23/2021 0811   CO2 23 02/23/2021 0811   CO2 24 11/29/2016 0829   GLUCOSE 98 02/23/2021 0811   GLUCOSE 101 11/29/2016 0829   BUN 23 02/23/2021 0811   BUN 17.0 11/29/2016 0829   CREATININE 1.14 02/23/2021 0811   CREATININE 0.95 08/14/2020 0859   CREATININE 0.9 11/29/2016 0829   CALCIUM 8.6 (L) 02/23/2021 0811    CALCIUM 9.1 11/29/2016 0829   PROT 6.9 02/23/2021 0811   PROT 7.0 11/29/2016 0829   ALBUMIN 3.7 02/23/2021 0811   ALBUMIN 3.8 11/29/2016 0829   AST 19 02/23/2021 0811   AST 19 09/25/2018 0813   AST 17 11/29/2016 0829   ALT 13 02/23/2021 0811   ALT 16 09/25/2018 0813   ALT 16 11/29/2016 0829   ALKPHOS 55 02/23/2021 0811   ALKPHOS 63 11/29/2016 0829   BILITOT 0.8 02/23/2021 0811   BILITOT 0.7 09/25/2018 0813   BILITOT 0.83 11/29/2016 0829   GFRNONAA >60 02/23/2021 0811   GFRNONAA >60 09/25/2018 0813   GFRAA >60 09/14/2019 0800   GFRAA >60 09/25/2018 0813    No results found for: SPEP, UPEP  Lab Results  Component Value Date   WBC 4.5 02/23/2021   NEUTROABS 1.9 02/23/2021   HGB 12.2 (L) 02/23/2021   HCT 36.5 (L) 02/23/2021   MCV 90.3 02/23/2021   PLT 162 02/23/2021      Chemistry      Component Value Date/Time   NA 138 02/23/2021 0811   NA 139 11/29/2016 0829   K 4.4 02/23/2021 0811   K 4.3 11/29/2016 0829   CL 107 02/23/2021 0811   CO2 23 02/23/2021 0811   CO2 24 11/29/2016 0829   BUN 23 02/23/2021 0811   BUN 17.0 11/29/2016 0829   CREATININE 1.14 02/23/2021 0811   CREATININE 0.95 08/14/2020 0859   CREATININE 0.9 11/29/2016 0829      Component Value Date/Time   CALCIUM 8.6 (L) 02/23/2021 0811   CALCIUM 9.1 11/29/2016 0829   ALKPHOS 55 02/23/2021 0811   ALKPHOS 63 11/29/2016 0829   AST 19 02/23/2021 0811   AST 19 09/25/2018 0813   AST 17 11/29/2016 0829   ALT 13 02/23/2021 0811   ALT 16 09/25/2018 0813   ALT 16 11/29/2016 0829   BILITOT 0.8 02/23/2021 0811   BILITOT 0.7 09/25/2018 0813   BILITOT 0.83 11/29/2016 0829

## 2021-03-05 NOTE — Assessment & Plan Note (Signed)
He has excellent response to treatment and remained in complete response We discussed the risk and benefits of chronic maintenance treatment with low-dose Sprycel He is in agreement I will see him again in 6 months for further follow-up 

## 2021-03-05 NOTE — Assessment & Plan Note (Signed)
He has poorly controlled hypertension and have history of atrial fibrillation I recommend vigilant blood pressure control at home and aggressive management of cardiovascular risk factors 

## 2021-03-10 ENCOUNTER — Other Ambulatory Visit (HOSPITAL_COMMUNITY): Payer: Self-pay

## 2021-03-10 ENCOUNTER — Telehealth: Payer: Self-pay

## 2021-03-10 NOTE — Telephone Encounter (Signed)
Oral Oncology Patient Advocate Encounter   Received notification from [employer] that prior authorization for Sprycel is required.   PA submitted on CoverMyMeds Key [id number] Status is pending   Oral Oncology Clinic will continue to follow.  [place] Patient [place] Phone [phone] Fax [phone] 03/10/2021 9:07 AM

## 2021-03-10 NOTE — Telephone Encounter (Signed)
Oral Oncology Patient Advocate Encounter  Prior Authorization for Sprycel has been approved.    PA# [id number] Effective dates: 03/10/21 through 03/10/22  Oral Oncology Clinic will continue to follow.   [place] Patient [place] Phone [phone] Fax [phone] 03/10/2021 9:08 AM

## 2021-03-11 ENCOUNTER — Other Ambulatory Visit (HOSPITAL_COMMUNITY): Payer: Self-pay

## 2021-03-17 ENCOUNTER — Other Ambulatory Visit (HOSPITAL_COMMUNITY): Payer: Self-pay

## 2021-04-13 ENCOUNTER — Ambulatory Visit: Payer: 59 | Admitting: Cardiology

## 2021-04-20 ENCOUNTER — Ambulatory Visit: Payer: PPO | Admitting: Cardiology

## 2021-04-20 ENCOUNTER — Other Ambulatory Visit: Payer: Self-pay

## 2021-04-20 ENCOUNTER — Encounter: Payer: Self-pay | Admitting: Cardiology

## 2021-04-20 DIAGNOSIS — I480 Paroxysmal atrial fibrillation: Secondary | ICD-10-CM

## 2021-04-20 DIAGNOSIS — I2510 Atherosclerotic heart disease of native coronary artery without angina pectoris: Secondary | ICD-10-CM

## 2021-04-20 DIAGNOSIS — I10 Essential (primary) hypertension: Secondary | ICD-10-CM

## 2021-04-20 DIAGNOSIS — I420 Dilated cardiomyopathy: Secondary | ICD-10-CM

## 2021-04-20 DIAGNOSIS — C9210 Chronic myeloid leukemia, BCR/ABL-positive, not having achieved remission: Secondary | ICD-10-CM

## 2021-04-20 NOTE — Assessment & Plan Note (Signed)
Previous tachycardia mediated.  35 EF to 55 after therapy for atrial fibrillation.  Continuing with both beta-blocker and ARB as described above.  No changes

## 2021-04-20 NOTE — Assessment & Plan Note (Signed)
With his high coronary calcium score, recommended goal-directed medical therapy.  He has discussed with our lipid clinic.  Appreciate their assistance.  A prescription for Crestor was sent in at 1 point.  Did not take.  He would rather take over-the-counter medications.  Prior LDL 156 on 08/14/2020.  Using supplements.  Once again discussed with him the importance of these traditional medications such as statins or alternatives such as PCSK9 inhibitors to help reduce his overall risk of heart attack, stroke.

## 2021-04-20 NOTE — Patient Instructions (Signed)
Medication Instructions:  The current medical regimen is effective;  continue present plan and medications.  *If you need a refill on your cardiac medications before your next appointment, please call your pharmacy*  Follow-Up: At [facility], you and your health needs are our priority.  As part of our continuing mission to provide you with exceptional heart care, we have created designated Provider Care Teams.  These Care Teams include your primary Cardiologist (physician) and Advanced Practice Providers (APPs -  Physician Assistants and Nurse Practitioners) who all work together to provide you with the care you need, when you need it.  We recommend signing up for the patient portal called "MyChart".  Sign up information is provided on this After Visit Summary.  MyChart is used to connect with patients for Virtual Visits (Telemedicine).  Patients are able to view lab/test results, encounter notes, upcoming appointments, etc.  Non-urgent messages can be sent to your provider as well.   To learn more about what you can do with MyChart, go to [email or web address].    Your next appointment:   1 year(s)  The format for your next appointment:   In Person  Provider:   Mark Skains, MD   Thank you for choosing [facility]!!    

## 2021-04-20 NOTE — Assessment & Plan Note (Signed)
This has been longstanding.  Continue to monitor blood pressures especially in the outpatient setting.  At home it has been reasonable.

## 2021-04-20 NOTE — Assessment & Plan Note (Signed)
Reviewed oncology notes.  On Sprycel 50 mg a day.  Occasional pancytopenia.  Asymptomatic.

## 2021-04-20 NOTE — Progress Notes (Signed)
Cardiology Office Note:    Date:  04/20/2021   ID:  Larry Vasquez, DOB 02-14-1951, MRN [id number]  PCP:  Eunice Blase, MD   [facility] Providers Cardiologist:  Candee Furbish, MD     Referring MD: Eunice Blase, MD    History of Present Illness:    Larry Vasquez is a 71 y.o. male here for the follow-up of CAD, coronary artery calcification, atrial fibrillation post ablation in 2018 with prior ejection fraction 35 to 40% thought to be tachycardia mediated with repeat echocardiogram showing EF of 55%.  Struggle in the past with whitecoat hypertension.  No recurrent episodes of atrial fibrillation.  He is also being followed for chronic myelogenous leukemia with Dr. Simeon Craft such.  In remission.  Intermittent pancytopenia during the treatment.  Also has mild aortic root dilatation of 4.2 cm.  Calcium score 2746 with dense LAD RCA 97 percentile.  Encouraged ischemic evaluation however not performed.  Enjoying golf 2 or 3 times a week.  Walking without any difficulty.  No chest pain no fevers chills nausea vomiting syncope    Past Medical History:  Diagnosis Date   Anemia    Cataract    Left eye   CML (chronic myelocytic leukemia) (HCC)    Dysrhythmia    Leukocytosis, unspecified 10/17/2012   Thrombocytosis 10/17/2012   White coat hypertension 06/06/2014    Past Surgical History:  Procedure Laterality Date   ATRIAL FIBRILLATION ABLATION [name] 10/08/2016   Procedure: Atrial Fibrillation Ablation;  Surgeon: Constance Haw, MD;  Location: [place] [facility];  Service: Cardiovascular;  Laterality: [name];   CARDIOVERSION [name] 06/24/2016   Procedure: CARDIOVERSION;  Surgeon: [name], MD;  Location: [place];  Service: Cardiovascular;  Laterality: [name];   CATARACT EXTRACTION Left 01/01/2013   EYE SURGERY  2012   Detached Retina   HERNIA REPAIR     HOLEP-LASER ENUCLEATION OF THE PROSTATE WITH MORCELLATION [name] 07/09/2019   Procedure: HOLEP-LASER ENUCLEATION OF THE  PROSTATE WITH MORCELLATION;  Surgeon: Hollice Espy, MD;  Location: [facility];  Service: Urology;  Laterality: [name];   JOINT REPLACEMENT  2006   Total Knee Left   TONSILLECTOMY      Current Medications: Current Meds  Medication Sig   acetaminophen (TYLENOL) 500 MG tablet Take 1,000 mg by mouth every 6 (six) hours as needed for moderate pain or headache.    APPLE CIDER VINEGAR PO Take 15 mLs by mouth daily.    BLACK CURRANT SEED OIL PO Take by mouth.   Cholecalciferol (VITAMIN D PO) Take 5,000 Units by mouth daily.   Coenzyme Q10 (CO Q-10) 100 MG CAPS Take 100 mg by mouth daily.   Digestive Enzymes (DIGESTIVE ENZYME PO) Take 1 tablet by mouth as needed.   Garlic 177 MG TABS Take by mouth.   losartan (COZAAR) 25 MG tablet TAKE 1 TABLET (25 MG TOTAL) BY MOUTH DAILY.   metoprolol (TOPROL-XL) 200 MG 24 hr tablet TAKE 1 TABLET BY MOUTH EVERY DAY   MILK THISTLE PO Take 1 capsule by mouth 3 (three) times a week.    Multiple Vitamin (MULTIVITAMIN WITH MINERALS) TABS tablet Take 1 tablet by mouth daily.   Multiple Vitamins-Minerals (ZINC PO) Take 1 tablet by mouth 3 (three) times a week.   OVER THE COUNTER MEDICATION Take 1 packet by mouth at bedtime. NATURAL CALM MAGNESIUM POWDER-MIXES IN DRINK   PUMPKIN SEED PO Take by mouth.   Selenium 200 MCG CAPS Take by mouth.   SPRYCEL 50 MG  tablet TAKE 1 TABLET BY MOUTH ONCE DAILY AT THE SAME TIME. MAY TAKE WITH OR WITHOUT FOOD. SWALLOW WHOLE. AVOID GRAPEFRUIT PRODUCTS.   Taurine 1000 MG CAPS Take by mouth.   TURMERIC CURCUMIN PO Take 1-2 capsules by mouth daily.      Allergies:   Corticosteroids   Social History   Socioeconomic History   Marital status: Married    Spouse name: Not on file   Number of children: Not on file   Years of education: Not on file   Highest education level: Not on file  Occupational History   Not on file  Tobacco Use   Smoking status: Never   Smokeless tobacco: Never  Vaping Use   Vaping Use: Never used  Substance  and Sexual Activity   Alcohol use: Yes    Alcohol/week: 7.0 standard drinks    Types: 7 Glasses of wine per week    Comment: daily wine    Drug use: No   Sexual activity: Yes    Birth control/protection: None  Other Topics Concern   Not on file  Social History Narrative   Not on file   Social Determinants of Health   Financial Resource Strain: Not on file  Food Insecurity: Not on file  Transportation Needs: Not on file  Physical Activity: Not on file  Stress: Not on file  Social Connections: Not on file     Family History: The patient's family history includes Arthritis in his mother; Atrial fibrillation in his father; Bladder Cancer in his father; CAD in his father; Cancer in his father; Chronic Renal Failure in his father; Diabetes in his father; Healthy in his brother and brother; Hypertension in his father and mother.  ROS:   Please see the history of present illness.     All other systems reviewed and are negative.  EKGs/Labs/Other Studies Reviewed:    Recent Labs: 08/14/2020: TSH 2.06 02/23/2021: ALT 13; BUN 23; Creatinine, Ser 1.14; Hemoglobin 12.2; Platelets 162; Potassium 4.4; Sodium 138  Recent Lipid Panel    Component Value Date/Time   CHOL 231 (H) 08/14/2020 0859   TRIG 88 08/14/2020 0859   HDL 56 08/14/2020 0859   CHOLHDL 4.1 08/14/2020 0859   LDLCALC 156 (H) 08/14/2020 0859     Risk Assessment/Calculations:              Physical Exam:    VS:  BP (!) 160/100 (BP Location: Left Arm, Patient Position: Sitting, Cuff Size: Normal)    Pulse 80    Ht 6\' 2"  (1.88 m)    Wt 221 lb (100.2 kg)    SpO2 98%    BMI 28.37 kg/m     Wt Readings from Last 3 Encounters:  04/20/21 221 lb (100.2 kg)  03/05/21 213 lb 9.6 oz (96.9 kg)  10/06/20 213 lb 12.8 oz (97 kg)     GEN:  Well nourished, well developed in no acute distress HEENT: Normal NECK: No JVD; No carotid bruits LYMPHATICS: No lymphadenopathy CARDIAC: RRR, no murmurs, no rubs, gallops RESPIRATORY:   Clear to auscultation without rales, wheezing or rhonchi  ABDOMEN: Soft, non-tender, non-distended MUSCULOSKELETAL:  No edema; No deformity  SKIN: Warm and dry NEUROLOGIC:  Alert and oriented x 3 PSYCHIATRIC:  Normal affect   ASSESSMENT:    1. Paroxysmal atrial fibrillation (HCC)   2. Dilated cardiomyopathy ([place])   3. 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[place])   4. White coat syndrome with hypertension   5. Coronary artery disease involving native coronary  artery of native heart without angina pectoris    PLAN:    In order of problems listed above:  Paroxysmal atrial fibrillation post ablation 2018-Dr. Camnitz Thankfully, no recurrence.  He is off of anticoagulation with Eliquis.  Obviously if this were to return he would need to resume.  Continue with metoprolol medication management XL 200 mg once a day as well as losartan 25 mg a day.  Refills as needed.  Dilated cardiomyopathy (HCC) Previous tachycardia mediated.  35 EF to 55 after therapy for atrial fibrillation.  Continuing with both beta-blocker and ARB as described above.  No changes  Chronic myelogenous leukemia [facility]) Reviewed oncology notes.  On Sprycel 50 mg a day.  Occasional pancytopenia.  Asymptomatic.  White coat syndrome with hypertension This has been longstanding.  Continue to monitor blood pressures especially in the outpatient setting.  At home it has been reasonable.  Coronary artery disease involving native coronary artery of native heart without angina pectoris With his high coronary calcium score, recommended goal-directed medical therapy.  He has discussed with our lipid clinic.  Appreciate their assistance.  A prescription for Crestor was sent in at 1 point.  Did not take.  He would rather take over-the-counter medications.  Prior LDL 156 on 08/14/2020.  Using supplements.  Once again discussed with him the importance of these traditional medications such as statins or alternatives such as PCSK9 inhibitors to help  reduce his overall risk of heart attack, stroke.         Medication Adjustments/Labs and Tests Ordered: Current medicines are reviewed at length with the patient today.  Concerns regarding medicines are outlined above.  No orders of the defined types were placed in this encounter.  No orders of the defined types were placed in this encounter.   Patient Instructions  Medication Instructions:  The current medical regimen is effective;  continue present plan and medications.  *If you need a refill on your cardiac medications before your next appointment, please call your pharmacy*  Follow-Up: At [facility], you and your health needs are our priority.  As part of our continuing mission to provide you with exceptional heart care, we have created designated Provider Care Teams.  These Care Teams include your primary Cardiologist (physician) and Advanced Practice Providers (APPs -  Physician Assistants and Nurse Practitioners) who all work together to provide you with the care you need, when you need it.  We recommend signing up for the patient portal called "MyChart".  Sign up information is provided on this After Visit Summary.  MyChart is used to connect with patients for Virtual Visits (Telemedicine).  Patients are able to view lab/test results, encounter notes, upcoming appointments, etc.  Non-urgent messages can be sent to your provider as well.   To learn more about what you can do with MyChart, go to [email or web address].    Your next appointment:   1 year(s)  The format for your next appointment:   In Person  Provider:   Candee Furbish, MD     Thank you for choosing [facility]!!      Signed, Candee Furbish, MD  04/20/2021 8:54 AM    [place]

## 2021-04-20 NOTE — Assessment & Plan Note (Signed)
Thankfully, no recurrence.  He is off of anticoagulation with Eliquis.  Obviously if this were to return he would need to resume.  Continue with metoprolol medication management XL 200 mg once a day as well as losartan 25 mg a day.  Refills as needed.

## 2021-05-01 ENCOUNTER — Telehealth: Payer: Self-pay

## 2021-05-01 NOTE — Telephone Encounter (Signed)
Oral Oncology Patient Advocate Encounter  Met patient in lobby to complete application for [place] in an effort to reduce patient's out of pocket expense for Sprycel to $0.    Application completed and faxed to [phone]   [facility] Access Support phone number for follow up is [phone].  This encounter will be updated until final determination.  [place] Patient [place] Phone [phone] Fax [phone] 05/01/2021 10:54 AM

## 2021-05-06 ENCOUNTER — Other Ambulatory Visit (HOSPITAL_COMMUNITY): Payer: Self-pay

## 2021-05-13 ENCOUNTER — Other Ambulatory Visit (HOSPITAL_COMMUNITY): Payer: Self-pay

## 2021-05-22 ENCOUNTER — Other Ambulatory Visit: Payer: Self-pay

## 2021-05-22 MED ORDER — DASATINIB 50 MG PO TABS: ORAL_TABLET | ORAL | 11 refills | Status: DC

## 2021-05-29 NOTE — Telephone Encounter (Signed)
Patient is approved for Sprycel at no cost from [facility] 05/27/21-03/07/22 ? ?[facility] uses [facility]  ? ?[facility] [place] ?Specialty Pharmacy Patient Advocate ?[place] ?Phone [phone] ?Fax [phone] ?05/29/2021 1:27 PM ? ?

## 2021-08-20 ENCOUNTER — Telehealth: Payer: Self-pay | Admitting: Hematology and Oncology

## 2021-08-20 NOTE — Telephone Encounter (Signed)
.  Called patient to schedule appointment per 6/15 inbasket, patient is aware of date and time.   

## 2021-08-24 ENCOUNTER — Other Ambulatory Visit: Payer: 59

## 2021-08-31 ENCOUNTER — Inpatient Hospital Stay: Payer: PPO | Attending: Hematology and Oncology

## 2021-08-31 ENCOUNTER — Other Ambulatory Visit: Payer: Self-pay

## 2021-08-31 DIAGNOSIS — C9210 Chronic myeloid leukemia, BCR/ABL-positive, not having achieved remission: Secondary | ICD-10-CM

## 2021-08-31 DIAGNOSIS — I509 Heart failure, unspecified: Secondary | ICD-10-CM

## 2021-08-31 DIAGNOSIS — C9310 Chronic myelomonocytic leukemia not having achieved remission: Secondary | ICD-10-CM | POA: Insufficient documentation

## 2021-08-31 DIAGNOSIS — I10 Essential (primary) hypertension: Secondary | ICD-10-CM

## 2021-08-31 LAB — COMPREHENSIVE METABOLIC PANEL
ALT: 13 U/L (ref 0–44)
AST: 16 U/L (ref 15–41)
Albumin: 4.1 g/dL (ref 3.5–5.0)
Alkaline Phosphatase: 64 U/L (ref 38–126)
Anion gap: 6 (ref 5–15)
BUN: 22 mg/dL (ref 8–23)
CO2: 26 mmol/L (ref 22–32)
Calcium: 9.1 mg/dL (ref 8.9–10.3)
Chloride: 104 mmol/L (ref 98–111)
Creatinine, Ser: 1.09 mg/dL (ref 0.61–1.24)
GFR, Estimated: >60 mL/min (ref >60)
Glucose, Bld: 117 mg/dL — ABNORMAL HIGH (ref 70–99)
Potassium: 4.5 mmol/L (ref 3.5–5.1)
Sodium: 136 mmol/L (ref 135–145)
Total Bilirubin: 0.7 mg/dL (ref 0.3–1.2)
Total Protein: 7 g/dL (ref 6.5–8.1)

## 2021-08-31 LAB — CBC WITH DIFFERENTIAL/PLATELET
Abs Immature Granulocytes: 0.01 10*3/uL (ref 0.00–0.07)
Basophils Absolute: 0 10*3/uL (ref 0.0–0.1)
Basophils Relative: 1 %
Eosinophils Absolute: 0.1 10*3/uL (ref 0.0–0.5)
Eosinophils Relative: 4 %
HCT: 36.8 % — ABNORMAL LOW (ref 39.0–52.0)
Hemoglobin: 12.4 g/dL — ABNORMAL LOW (ref 13.0–17.0)
Immature Granulocytes: 0 %
Lymphocytes Relative: 40 %
Lymphs Abs: 1.6 10*3/uL (ref 0.7–4.0)
MCH: 30.6 pg (ref 26.0–34.0)
MCHC: 33.7 g/dL (ref 30.0–36.0)
MCV: 90.9 fL (ref 80.0–100.0)
Monocytes Absolute: 0.6 10*3/uL (ref 0.1–1.0)
Monocytes Relative: 16 %
Neutro Abs: 1.6 10*3/uL — ABNORMAL LOW (ref 1.7–7.7)
Neutrophils Relative %: 39 %
Platelets: 157 10*3/uL (ref 150–400)
RBC: 4.05 MIL/uL — ABNORMAL LOW (ref 4.22–5.81)
RDW: 14 % (ref 11.5–15.5)
WBC: 4.1 10*3/uL (ref 4.0–10.5)
nRBC: 0 % (ref 0.0–0.2)

## 2021-09-03 ENCOUNTER — Ambulatory Visit: Payer: 59 | Admitting: Hematology and Oncology

## 2021-09-04 LAB — BCR/ABL

## 2021-09-10 ENCOUNTER — Inpatient Hospital Stay: Payer: PPO | Admitting: Hematology and Oncology

## 2021-09-10 ENCOUNTER — Encounter: Payer: Self-pay | Admitting: Hematology and Oncology

## 2021-10-01 ENCOUNTER — Encounter: Payer: Self-pay | Admitting: Hematology and Oncology

## 2021-10-01 ENCOUNTER — Inpatient Hospital Stay: Payer: PPO | Attending: Hematology and Oncology | Admitting: Hematology and Oncology

## 2021-10-01 ENCOUNTER — Other Ambulatory Visit: Payer: Self-pay

## 2021-10-01 DIAGNOSIS — Z79899 Other long term (current) drug therapy: Secondary | ICD-10-CM | POA: Diagnosis not present

## 2021-10-01 DIAGNOSIS — T451X5A Adverse effect of antineoplastic and immunosuppressive drugs, initial encounter: Secondary | ICD-10-CM | POA: Diagnosis not present

## 2021-10-01 DIAGNOSIS — C9210 Chronic myeloid leukemia, BCR/ABL-positive, not having achieved remission: Secondary | ICD-10-CM | POA: Diagnosis present

## 2021-10-01 DIAGNOSIS — I10 Essential (primary) hypertension: Secondary | ICD-10-CM | POA: Diagnosis not present

## 2021-10-01 DIAGNOSIS — D6481 Anemia due to antineoplastic chemotherapy: Secondary | ICD-10-CM | POA: Insufficient documentation

## 2021-10-01 NOTE — Assessment & Plan Note (Signed)
He has poorly controlled hypertension and have history of atrial fibrillation I recommend vigilant blood pressure control at home and aggressive management of cardiovascular risk factors

## 2021-10-01 NOTE — Progress Notes (Signed)
[place] OFFICE PROGRESS NOTE  Patient Care Team: [place], Legrand Como, MD as PCP - General (Family Medicine) [name], MD as PCP - Cardiology (Cardiology) Marica Otter, OD (Optometry)  ASSESSMENT & PLAN:  Chronic myelogenous leukemia [facility]) He has excellent response to treatment and remained in complete response We discussed the risk and benefits of chronic maintenance treatment with low-dose Sprycel He is in agreement I will see him again in 6 months for further follow-up  White coat syndrome with hypertension He has poorly controlled hypertension and have history of atrial fibrillation I recommend vigilant blood pressure control at home and aggressive management of cardiovascular risk factors  Anemia due to antineoplastic chemotherapy This is likely due to recent treatment. The patient denies recent history of bleeding such as epistaxis, hematuria or hematochezia. He is asymptomatic from the anemia. I will observe for now  No orders of the defined types were placed in this encounter.   All questions were answered. The patient knows to call the clinic with any problems, questions or concerns. The total time spent in the appointment was 20 minutes encounter with patients including review of chart and various tests results, discussions about plan of care and coordination of care plan   Heath Lark, MD 10/01/2021 8:37 AM  INTERVAL HISTORY: Please see below for problem oriented charting. he returns for treatment follow-up on Sprycel for CML He is compliant taking medications as directed No recent infection Denies shortness of breath or signs of fluid retention  REVIEW OF SYSTEMS:   Constitutional: Denies fevers, chills or abnormal weight loss Eyes: Denies blurriness of vision Ears, nose, mouth, throat, and face: Denies mucositis or sore throat Respiratory: Denies cough, dyspnea or wheezes Cardiovascular: Denies palpitation, chest discomfort or lower extremity  swelling Gastrointestinal:  Denies nausea, heartburn or change in bowel habits Skin: Denies abnormal skin rashes Lymphatics: Denies new lymphadenopathy or easy bruising Neurological:Denies numbness, tingling or new weaknesses Behavioral/Psych: Mood is stable, no new changes  All other systems were reviewed with the patient and are negative.  I have reviewed the past medical history, past surgical history, social history and family history with the patient and they are unchanged from previous note.  ALLERGIES:  is allergic to corticosteroids.  MEDICATIONS:  Current Outpatient Medications  Medication Sig Dispense Refill   acetaminophen (TYLENOL) 500 MG tablet Take 1,000 mg by mouth every 6 (six) hours as needed for moderate pain or headache.      APPLE CIDER VINEGAR PO Take 15 mLs by mouth daily.      BLACK CURRANT SEED OIL PO Take by mouth.     Cholecalciferol (VITAMIN D PO) Take 5,000 Units by mouth daily.     Coenzyme Q10 (CO Q-10) 100 MG CAPS Take 100 mg by mouth daily.     dasatinib (SPRYCEL) 50 MG tablet TAKE 1 TABLET BY MOUTH ONCE DAILY AT THE SAME TIME. MAY TAKE WITH OR WITHOUT FOOD. SWALLOW WHOLE. AVOID GRAPEFRUIT PRODUCTS. 30 tablet 11   Digestive Enzymes (DIGESTIVE ENZYME PO) Take 1 tablet by mouth as needed.     Garlic 960 MG TABS Take by mouth.     losartan (COZAAR) 25 MG tablet TAKE 1 TABLET (25 MG TOTAL) BY MOUTH DAILY. 90 tablet 3   metoprolol (TOPROL-XL) 200 MG 24 hr tablet TAKE 1 TABLET BY MOUTH EVERY DAY 90 tablet 3   MILK THISTLE PO Take 1 capsule by mouth 3 (three) times a week.      Multiple Vitamin (MULTIVITAMIN WITH  MINERALS) TABS tablet Take 1 tablet by mouth daily.     Multiple Vitamins-Minerals (ZINC PO) Take 1 tablet by mouth 3 (three) times a week.     OVER THE COUNTER MEDICATION Take 1 packet by mouth at bedtime. NATURAL CALM MAGNESIUM POWDER-MIXES IN DRINK     PUMPKIN SEED PO Take by mouth.     Selenium 200 MCG CAPS Take by mouth.     Taurine 1000 MG CAPS  Take by mouth.     TURMERIC CURCUMIN PO Take 1-2 capsules by mouth daily.      No current facility-administered medications for this visit.    SUMMARY OF ONCOLOGIC HISTORY: Oncology History Overview Note  Physical   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[place])  11/01/2012 Bone Marrow Biopsy   Bone marrow aspirate and biopsy was performed for leukocytosis and thrombocytosis. 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   11/07/2012 - 08/09/2013 Chemotherapy   He was started on Gleevec 400 mg daily.   08/09/2013 Progression   Gleevec was discontinued due to a rising white blood cell count and positive ABL Kinase mutation study, 100% positive to E255V mutation   08/09/2013 - 08/21/2013 Chemotherapy   His therapy is switched to hydroxyurea 1000 mg daily pending approval for Dasatinib.   08/22/2013 -  Chemotherapy   He is started on dasatinib.   11/19/2013 Tumor Marker   BCR/ABL is improving   02/22/2014 Pathology Results   BCR/ABL continues to improve and the patient is moving towards molecular remission   05/27/2014 Pathology Results   BCR/ABL b2a2 & b3a2 both are 0.05%, b2a2 & b3a2 IS both are 0.028%. Patient in MMR   09/26/2014 Pathology Results   Repeat BCR/ABL was not detectable   02/28/2015 Tumor Marker   Repeat BCR/ABL was not detectable   08/28/2015 Tumor Marker   Repeat BCR/ABL was not detectable   01/06/2016 Pathology Results   Repeat BCR/ABL was not detectable   04/15/2016 Pathology Results   Repeat BCR/ABL was not detectable   07/22/2016 Pathology Results   Repeat BCR/ABL was not detectable   11/29/2016 Pathology Results   Repeat BCR/ABL was not detectable   05/30/2017 Pathology Results   Repeat BCR/ABL was not detectable   11/29/2017 Pathology Results   Repeat BCR/ABL was not detectable   10/02/2018 Pathology Results   BCR/ABL is not detectable.   03/23/2019 Pathology Results   BCR/ABL is not detectable   09/14/2019 Pathology Results   BCR/ABL is not detectable   02/15/2020  Pathology Results   BCR/ABL is not detectable   08/27/2020 Pathology Results   BCR/ABL is not detectable   02/23/2021 Pathology Results   Repeat BCR/ABL was not detectable   08/31/2021 Pathology Results   BCR/ABL is not detectable     PHYSICAL EXAMINATION: ECOG PERFORMANCE STATUS: 0 - Asymptomatic  Vitals:   10/01/21 0805  BP: (!) 188/72  Pulse: (!) 58  Resp: 18  Temp: 98.2 F (36.8 C)  SpO2: 99%   Filed Weights   10/01/21 0805  Weight: 226 lb 3.2 oz (102.6 kg)    GENERAL:alert, no distress and comfortable NEURO: alert & oriented x 3 with fluent speech, no focal motor/sensory deficits  LABORATORY DATA:  I have reviewed the data as listed    Component Value Date/Time   NA 136 08/31/2021 0819   NA 139 11/29/2016 0829   K 4.5 08/31/2021 0819   K 4.3 11/29/2016 0829   CL 104 08/31/2021 0819   CO2 26 08/31/2021 0819   CO2  24 11/29/2016 0829   GLUCOSE 117 (H) 08/31/2021 0819   GLUCOSE 101 11/29/2016 0829   BUN 22 08/31/2021 0819   BUN 17.0 11/29/2016 0829   CREATININE 1.09 08/31/2021 0819   CREATININE 0.95 08/14/2020 0859   CREATININE 0.9 11/29/2016 0829   CALCIUM 9.1 08/31/2021 0819   CALCIUM 9.1 11/29/2016 0829   PROT 7.0 08/31/2021 0819   PROT 7.0 11/29/2016 0829   ALBUMIN 4.1 08/31/2021 0819   ALBUMIN 3.8 11/29/2016 0829   AST 16 08/31/2021 0819   AST 19 09/25/2018 0813   AST 17 11/29/2016 0829   ALT 13 08/31/2021 0819   ALT 16 09/25/2018 0813   ALT 16 11/29/2016 0829   ALKPHOS 64 08/31/2021 0819   ALKPHOS 63 11/29/2016 0829   BILITOT 0.7 08/31/2021 0819   BILITOT 0.7 09/25/2018 0813   BILITOT 0.83 11/29/2016 0829   GFRNONAA >60 08/31/2021 0819   GFRNONAA >60 09/25/2018 0813   GFRAA >60 09/14/2019 0800   GFRAA >60 09/25/2018 0813    No results found for: "SPEP", "UPEP"  Lab Results  Component Value Date   WBC 4.1 08/31/2021   NEUTROABS 1.6 (L) 08/31/2021   HGB 12.4 (L) 08/31/2021   HCT 36.8 (L) 08/31/2021   MCV 90.9 08/31/2021   PLT 157  08/31/2021      Chemistry      Component Value Date/Time   NA 136 08/31/2021 0819   NA 139 11/29/2016 0829   K 4.5 08/31/2021 0819   K 4.3 11/29/2016 0829   CL 104 08/31/2021 0819   CO2 26 08/31/2021 0819   CO2 24 11/29/2016 0829   BUN 22 08/31/2021 0819   BUN 17.0 11/29/2016 0829   CREATININE 1.09 08/31/2021 0819   CREATININE 0.95 08/14/2020 0859   CREATININE 0.9 11/29/2016 0829      Component Value Date/Time   CALCIUM 9.1 08/31/2021 0819   CALCIUM 9.1 11/29/2016 0829   ALKPHOS 64 08/31/2021 0819   ALKPHOS 63 11/29/2016 0829   AST 16 08/31/2021 0819   AST 19 09/25/2018 0813   AST 17 11/29/2016 0829   ALT 13 08/31/2021 0819   ALT 16 09/25/2018 0813   ALT 16 11/29/2016 0829   BILITOT 0.7 08/31/2021 0819   BILITOT 0.7 09/25/2018 0813   BILITOT 0.83 11/29/2016 0829

## 2021-10-01 NOTE — Assessment & Plan Note (Signed)
This is likely due to recent treatment. The patient denies recent history of bleeding such as epistaxis, hematuria or hematochezia. He is asymptomatic from the anemia. I will observe for now.    

## 2021-10-01 NOTE — Assessment & Plan Note (Signed)
He has excellent response to treatment and remained in complete response We discussed the risk and benefits of chronic maintenance treatment with low-dose Sprycel He is in agreement I will see him again in 6 months for further follow-up

## 2021-11-05 ENCOUNTER — Other Ambulatory Visit: Payer: Self-pay | Admitting: Cardiology

## 2022-01-07 ENCOUNTER — Other Ambulatory Visit (HOSPITAL_COMMUNITY): Payer: Self-pay

## 2022-01-07 ENCOUNTER — Telehealth: Payer: Self-pay | Admitting: Pharmacy Technician

## 2022-01-07 NOTE — Telephone Encounter (Signed)
Oral Oncology Patient Advocate Encounter   Received notification that patient is due for re-enrollment for assistance for Sprycel through [employer].   Re-enrollment process has been initiated and will be submitted upon completion of necessary documents.  [employer] phone number [phone].   I will continue to follow until final determination.  Lady Deutscher, CPhT-Adv Oncology Pharmacy Patient [place] Direct Number: [phone]  Fax: [phone]

## 2022-01-11 NOTE — Telephone Encounter (Signed)
Oral Oncology Patient Advocate Encounter   Submitted application for assistance for Sprycel to [employer].   Application submitted via e-fax to [phone]   [facility] phone number [phone].   I will continue to check the status until final determination.   Lady Deutscher, CPhT-Adv Oncology Pharmacy Patient [place] Direct Number: [phone]  Fax: [phone]

## 2022-02-03 ENCOUNTER — Other Ambulatory Visit: Payer: Self-pay | Admitting: Cardiology

## 2022-02-03 DIAGNOSIS — I4819 Other persistent atrial fibrillation: Secondary | ICD-10-CM

## 2022-02-03 DIAGNOSIS — Z01810 Encounter for preprocedural cardiovascular examination: Secondary | ICD-10-CM

## 2022-02-03 DIAGNOSIS — I10 Essential (primary) hypertension: Secondary | ICD-10-CM

## 2022-03-19 ENCOUNTER — Other Ambulatory Visit (HOSPITAL_COMMUNITY): Payer: Self-pay

## 2022-03-26 ENCOUNTER — Other Ambulatory Visit: Payer: Self-pay

## 2022-03-26 ENCOUNTER — Inpatient Hospital Stay: Payer: PPO | Attending: Hematology and Oncology

## 2022-03-26 DIAGNOSIS — C9210 Chronic myeloid leukemia, BCR/ABL-positive, not having achieved remission: Secondary | ICD-10-CM | POA: Insufficient documentation

## 2022-03-26 DIAGNOSIS — I10 Essential (primary) hypertension: Secondary | ICD-10-CM

## 2022-03-26 DIAGNOSIS — I509 Heart failure, unspecified: Secondary | ICD-10-CM

## 2022-03-26 LAB — CBC WITH DIFFERENTIAL/PLATELET
Abs Immature Granulocytes: 0.02 10*3/uL (ref 0.00–0.07)
Basophils Absolute: 0 10*3/uL (ref 0.0–0.1)
Basophils Relative: 1 %
Eosinophils Absolute: 0.2 10*3/uL (ref 0.0–0.5)
Eosinophils Relative: 3 %
HCT: 39 % (ref 39.0–52.0)
Hemoglobin: 13.3 g/dL (ref 13.0–17.0)
Immature Granulocytes: 0 %
Lymphocytes Relative: 42 %
Lymphs Abs: 1.9 10*3/uL (ref 0.7–4.0)
MCH: 30.9 pg (ref 26.0–34.0)
MCHC: 34.1 g/dL (ref 30.0–36.0)
MCV: 90.7 fL (ref 80.0–100.0)
Monocytes Absolute: 0.6 10*3/uL (ref 0.1–1.0)
Monocytes Relative: 13 %
Neutro Abs: 1.8 10*3/uL (ref 1.7–7.7)
Neutrophils Relative %: 41 %
Platelets: 181 10*3/uL (ref 150–400)
RBC: 4.3 MIL/uL (ref 4.22–5.81)
RDW: 14.2 % (ref 11.5–15.5)
WBC: 4.5 10*3/uL (ref 4.0–10.5)
nRBC: 0 % (ref 0.0–0.2)

## 2022-03-26 LAB — COMPREHENSIVE METABOLIC PANEL
ALT: 14 U/L (ref 0–44)
AST: 18 U/L (ref 15–41)
Albumin: 4 g/dL (ref 3.5–5.0)
Alkaline Phosphatase: 56 U/L (ref 38–126)
Anion gap: 6 (ref 5–15)
BUN: 20 mg/dL (ref 8–23)
CO2: 26 mmol/L (ref 22–32)
Calcium: 9.2 mg/dL (ref 8.9–10.3)
Chloride: 105 mmol/L (ref 98–111)
Creatinine, Ser: 1.19 mg/dL (ref 0.61–1.24)
GFR, Estimated: >60 mL/min (ref >60)
Glucose, Bld: 90 mg/dL (ref 70–99)
Potassium: 4.2 mmol/L (ref 3.5–5.1)
Sodium: 137 mmol/L (ref 135–145)
Total Bilirubin: 0.7 mg/dL (ref 0.3–1.2)
Total Protein: 6.9 g/dL (ref 6.5–8.1)

## 2022-03-30 LAB — BCR/ABL

## 2022-04-06 ENCOUNTER — Ambulatory Visit: Payer: PPO | Admitting: Hematology and Oncology

## 2022-04-12 ENCOUNTER — Other Ambulatory Visit: Payer: Self-pay

## 2022-04-12 MED ORDER — DASATINIB 50 MG PO TABS: ORAL_TABLET | ORAL | 6 refills | Status: DC

## 2022-04-12 NOTE — Progress Notes (Signed)
Received below message via secure chat from Lady Deutscher, CCPhT:  "Would you be able to send refills of this patient's Sprycel to [facility] pharmacy with a note that says " for BMSPAF renewal" when you get a chance? They require a new rx for his re-enrollment in patient assistance. They won't use the one they already have on file, from what they said it voids out during renewals. Can give 6 refills."  Sprycel filled with 6 refills including new note to pharmacy.

## 2022-04-12 NOTE — Telephone Encounter (Signed)
Oral Oncology Patient Advocate Encounter  Called to check status of patient assistance for Sprycel with [employer]MS[employer].  According to representative final step in the process is to have a new rx sent over to the program for the medication so renewal can be processed and triaged to pharmacy, [facility].  I will continue to follow until final determination.  Lady Deutscher, CPhT-Adv Oncology Pharmacy Patient [place] Direct Number: [phone]  Fax: [phone]

## 2022-04-13 ENCOUNTER — Inpatient Hospital Stay: Payer: PPO | Attending: Hematology and Oncology | Admitting: Hematology and Oncology

## 2022-04-13 ENCOUNTER — Encounter: Payer: Self-pay | Admitting: Hematology and Oncology

## 2022-04-13 VITALS — BP 201/78 | HR 65 | Temp 98.1°F | Resp 18 | Ht 74.0 in | Wt 224.4 lb

## 2022-04-13 DIAGNOSIS — I10 Essential (primary) hypertension: Secondary | ICD-10-CM

## 2022-04-13 DIAGNOSIS — C9210 Chronic myeloid leukemia, BCR/ABL-positive, not having achieved remission: Secondary | ICD-10-CM | POA: Diagnosis present

## 2022-04-13 DIAGNOSIS — Z79899 Other long term (current) drug therapy: Secondary | ICD-10-CM | POA: Insufficient documentation

## 2022-04-13 NOTE — Assessment & Plan Note (Signed)
His blood pressure is very high today but he is not symptomatic According to the patient, his blood pressure monitoring at home is usually within normal range I recommend vigilant blood pressure control at home and aggressive management of cardiovascular risk factors

## 2022-04-13 NOTE — Assessment & Plan Note (Signed)
He has excellent response to treatment and remained in complete response We discussed the risk and benefits of chronic maintenance treatment with low-dose Sprycel He is in agreement I will see him again in 6 months for further follow-up I warned the patient side effects including fluid retention, leg swelling or shortness of breath

## 2022-04-13 NOTE — Progress Notes (Signed)
[place] OFFICE PROGRESS NOTE  Patient Care Team: [place], Legrand Como, MD as PCP - General (Family Medicine) [name], MD as PCP - Cardiology (Cardiology) Marica Otter, OD (Optometry)  ASSESSMENT & PLAN:  Chronic myelogenous leukemia [facility]) He has excellent response to treatment and remained in complete response We discussed the risk and benefits of chronic maintenance treatment with low-dose Sprycel He is in agreement I will see him again in 6 months for further follow-up I warned the patient side effects including fluid retention, leg swelling or shortness of breath  White coat syndrome with hypertension His blood pressure is very high today but he is not symptomatic According to the patient, his blood pressure monitoring at home is usually within normal range I recommend vigilant blood pressure control at home and aggressive management of cardiovascular risk factors  No orders of the defined types were placed in this encounter.   All questions were answered. The patient knows to call the clinic with any problems, questions or concerns. The total time spent in the appointment was 20 minutes encounter with patients including review of chart and various tests results, discussions about plan of care and coordination of care plan   Heath Lark, MD 04/13/2022 9:08 AM  INTERVAL HISTORY: Please see below for problem oriented charting. he returns for treatment follow-up on Sprycel for CML He is doing well He denies side effects He denies missing doses No recent infection Denies fluid retention such as leg swelling or shortness of breath   REVIEW OF SYSTEMS:   Constitutional: Denies fevers, chills or abnormal weight loss Eyes: Denies blurriness of vision Ears, nose, mouth, throat, and face: Denies mucositis or sore throat Respiratory: Denies cough, dyspnea or wheezes Cardiovascular: Denies palpitation, chest discomfort or lower extremity  swelling Gastrointestinal:  Denies nausea, heartburn or change in bowel habits Skin: Denies abnormal skin rashes Lymphatics: Denies new lymphadenopathy or easy bruising Neurological:Denies numbness, tingling or new weaknesses Behavioral/Psych: Mood is stable, no new changes  All other systems were reviewed with the patient and are negative.  I have reviewed the past medical history, past surgical history, social history and family history with the patient and they are unchanged from previous note.  ALLERGIES:  is allergic to corticosteroids.  MEDICATIONS:  Current Outpatient Medications  Medication Sig Dispense Refill   acetaminophen (TYLENOL) 500 MG tablet Take 1,000 mg by mouth every 6 (six) hours as needed for moderate pain or headache.      APPLE CIDER VINEGAR PO Take 15 mLs by mouth daily.      BLACK CURRANT SEED OIL PO Take by mouth.     Cholecalciferol (VITAMIN D PO) Take 5,000 Units by mouth daily.     Coenzyme Q10 (CO Q-10) 100 MG CAPS Take 100 mg by mouth daily.     dasatinib (SPRYCEL) 50 MG tablet TAKE 1 TABLET BY MOUTH ONCE DAILY AT THE SAME TIME. MAY TAKE WITH OR WITHOUT FOOD. SWALLOW WHOLE. AVOID GRAPEFRUIT PRODUCTS. 30 tablet 6   Digestive Enzymes (DIGESTIVE ENZYME PO) Take 1 tablet by mouth as needed.     Garlic 700 MG TABS Take by mouth.     losartan (COZAAR) 25 MG tablet TAKE 1 TABLET (25 MG TOTAL) BY MOUTH DAILY. 90 tablet 1   metoprolol (TOPROL-XL) 200 MG 24 hr tablet TAKE 1 TABLET BY MOUTH EVERY DAY 90 tablet 0   MILK THISTLE PO Take 1 capsule by mouth 3 (three) times a week.      Multiple Vitamin (  MULTIVITAMIN WITH MINERALS) TABS tablet Take 1 tablet by mouth daily.     Multiple Vitamins-Minerals (ZINC PO) Take 1 tablet by mouth 3 (three) times a week.     OVER THE COUNTER MEDICATION Take 1 packet by mouth at bedtime. NATURAL CALM MAGNESIUM POWDER-MIXES IN DRINK     PUMPKIN SEED PO Take by mouth.     Selenium 200 MCG CAPS Take by mouth.     Taurine 1000 MG CAPS  Take by mouth.     TURMERIC CURCUMIN PO Take 1-2 capsules by mouth daily.      No current facility-administered medications for this visit.    SUMMARY OF ONCOLOGIC HISTORY: Oncology History Overview Note  Physical   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[place])  11/01/2012 Bone Marrow Biopsy   Bone marrow aspirate and biopsy was performed for leukocytosis and thrombocytosis. 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   11/07/2012 - 08/09/2013 Chemotherapy   He was started on Gleevec 400 mg daily.   08/09/2013 Progression   Gleevec was discontinued due to a rising white blood cell count and positive ABL Kinase mutation study, 100% positive to E255V mutation   08/09/2013 - 08/21/2013 Chemotherapy   His therapy is switched to hydroxyurea 1000 mg daily pending approval for Dasatinib.   08/22/2013 -  Chemotherapy   He is started on dasatinib.   11/19/2013 Tumor Marker   BCR/ABL is improving   02/22/2014 Pathology Results   BCR/ABL continues to improve and the patient is moving towards molecular remission   05/27/2014 Pathology Results   BCR/ABL b2a2 & b3a2 both are 0.05%, b2a2 & b3a2 IS both are 0.028%. Patient in MMR   09/26/2014 Pathology Results   Repeat BCR/ABL was not detectable   02/28/2015 Tumor Marker   Repeat BCR/ABL was not detectable   08/28/2015 Tumor Marker   Repeat BCR/ABL was not detectable   01/06/2016 Pathology Results   Repeat BCR/ABL was not detectable   04/15/2016 Pathology Results   Repeat BCR/ABL was not detectable   07/22/2016 Pathology Results   Repeat BCR/ABL was not detectable   11/29/2016 Pathology Results   Repeat BCR/ABL was not detectable   05/30/2017 Pathology Results   Repeat BCR/ABL was not detectable   11/29/2017 Pathology Results   Repeat BCR/ABL was not detectable   10/02/2018 Pathology Results   BCR/ABL is not detectable.   03/23/2019 Pathology Results   BCR/ABL is not detectable   09/14/2019 Pathology Results   BCR/ABL is not detectable   02/15/2020  Pathology Results   BCR/ABL is not detectable   08/27/2020 Pathology Results   BCR/ABL is not detectable   02/23/2021 Pathology Results   Repeat BCR/ABL was not detectable   08/31/2021 Pathology Results   BCR/ABL is not detectable   03/26/2022 Pathology Results   BCR/ABL is not detectable       PHYSICAL EXAMINATION: ECOG PERFORMANCE STATUS: 0 - Asymptomatic  Vitals:   04/13/22 0811  BP: (!) 201/78  Pulse: 65  Resp: 18  Temp: 98.1 F (36.7 C)  SpO2: 100%   Filed Weights   04/13/22 0811  Weight: 224 lb 6.4 oz (101.8 kg)    GENERAL:alert, no distress and comfortable  NEURO: alert & oriented x 3 with fluent speech, no focal motor/sensory deficits  LABORATORY DATA:  I have reviewed the data as listed    Component Value Date/Time   NA 137 03/26/2022 0844   NA 139 11/29/2016 0829   K 4.2 03/26/2022 0844   K 4.3 11/29/2016 0829  CL 105 03/26/2022 0844   CO2 26 03/26/2022 0844   CO2 24 11/29/2016 0829   GLUCOSE 90 03/26/2022 0844   GLUCOSE 101 11/29/2016 0829   BUN 20 03/26/2022 0844   BUN 17.0 11/29/2016 0829   CREATININE 1.19 03/26/2022 0844   CREATININE 0.95 08/14/2020 0859   CREATININE 0.9 11/29/2016 0829   CALCIUM 9.2 03/26/2022 0844   CALCIUM 9.1 11/29/2016 0829   PROT 6.9 03/26/2022 0844   PROT 7.0 11/29/2016 0829   ALBUMIN 4.0 03/26/2022 0844   ALBUMIN 3.8 11/29/2016 0829   AST 18 03/26/2022 0844   AST 19 09/25/2018 0813   AST 17 11/29/2016 0829   ALT 14 03/26/2022 0844   ALT 16 09/25/2018 0813   ALT 16 11/29/2016 0829   ALKPHOS 56 03/26/2022 0844   ALKPHOS 63 11/29/2016 0829   BILITOT 0.7 03/26/2022 0844   BILITOT 0.7 09/25/2018 0813   BILITOT 0.83 11/29/2016 0829   GFRNONAA >60 03/26/2022 0844   GFRNONAA >60 09/25/2018 0813   GFRAA >60 09/14/2019 0800   GFRAA >60 09/25/2018 0813    No results found for: "SPEP", "UPEP"  Lab Results  Component Value Date   WBC 4.5 03/26/2022   NEUTROABS 1.8 03/26/2022   HGB 13.3 03/26/2022   HCT 39.0  03/26/2022   MCV 90.7 03/26/2022   PLT 181 03/26/2022      Chemistry      Component Value Date/Time   NA 137 03/26/2022 0844   NA 139 11/29/2016 0829   K 4.2 03/26/2022 0844   K 4.3 11/29/2016 0829   CL 105 03/26/2022 0844   CO2 26 03/26/2022 0844   CO2 24 11/29/2016 0829   BUN 20 03/26/2022 0844   BUN 17.0 11/29/2016 0829   CREATININE 1.19 03/26/2022 0844   CREATININE 0.95 08/14/2020 0859   CREATININE 0.9 11/29/2016 0829      Component Value Date/Time   CALCIUM 9.2 03/26/2022 0844   CALCIUM 9.1 11/29/2016 0829   ALKPHOS 56 03/26/2022 0844   ALKPHOS 63 11/29/2016 0829   AST 18 03/26/2022 0844   AST 19 09/25/2018 0813   AST 17 11/29/2016 0829   ALT 14 03/26/2022 0844   ALT 16 09/25/2018 0813   ALT 16 11/29/2016 0829   BILITOT 0.7 03/26/2022 0844   BILITOT 0.7 09/25/2018 0813   BILITOT 0.83 11/29/2016 0829

## 2022-04-14 ENCOUNTER — Telehealth: Payer: Self-pay | Admitting: Hematology and Oncology

## 2022-04-14 NOTE — Telephone Encounter (Signed)
Spoke with patient confirming upcoming appointment 

## 2022-04-20 NOTE — Telephone Encounter (Signed)
Oral Oncology Patient Advocate Encounter  Final rx submitted via e-fax to [phone]   [facility] phone number [phone].   I will continue to check the status until final determination.   Lady Deutscher, CPhT-Adv Oncology Pharmacy Patient [place] Direct Number: [phone]  Fax: [phone]

## 2022-04-22 ENCOUNTER — Other Ambulatory Visit (HOSPITAL_COMMUNITY): Payer: Self-pay

## 2022-04-22 NOTE — Telephone Encounter (Signed)
Oral Oncology Patient Advocate Encounter   Received notification re-enrollment for assistance for Sprycel through BMSPAF has been approved. Patient may continue to receive their medication at $0 from this program.    [employer] phone number [phone].   Effective dates: 04/22/22 through 03/08/23  I have spoken to the patient.  Lady Deutscher, CPhT-Adv Oncology Pharmacy Patient [place] Direct Number: [phone]  Fax: [phone]

## 2022-04-26 ENCOUNTER — Encounter: Payer: Self-pay | Admitting: Physician Assistant

## 2022-04-26 ENCOUNTER — Ambulatory Visit: Payer: PPO | Attending: Physician Assistant | Admitting: Physician Assistant

## 2022-04-26 VITALS — BP 190/90 | HR 60 | Ht 74.0 in | Wt 225.4 lb

## 2022-04-26 DIAGNOSIS — R931 Abnormal findings on diagnostic imaging of heart and coronary circulation: Secondary | ICD-10-CM | POA: Diagnosis not present

## 2022-04-26 DIAGNOSIS — C9210 Chronic myeloid leukemia, BCR/ABL-positive, not having achieved remission: Secondary | ICD-10-CM

## 2022-04-26 DIAGNOSIS — I10 Essential (primary) hypertension: Secondary | ICD-10-CM

## 2022-04-26 DIAGNOSIS — I480 Paroxysmal atrial fibrillation: Secondary | ICD-10-CM

## 2022-04-26 DIAGNOSIS — I420 Dilated cardiomyopathy: Secondary | ICD-10-CM

## 2022-04-26 DIAGNOSIS — I071 Rheumatic tricuspid insufficiency: Secondary | ICD-10-CM

## 2022-04-26 MED ORDER — LOSARTAN POTASSIUM 50 MG PO TABS: 50.0000 mg | ORAL_TABLET | Freq: Every day | ORAL | 3 refills | Status: AC

## 2022-04-26 NOTE — Progress Notes (Signed)
Office Visit    Patient Name: Larry Vasquez Date of Encounter: 04/26/2022  PCP:  Eunice Blase, MD   [place]  Cardiologist:  Candee Furbish, MD  Advanced Practice Provider:  No care team member to display Electrophysiologist:  None    HPI    Larry Vasquez is a 72 y.o. male with a past medical history of CAD, atrial fibrillation post ablation in 06/10/16 with prior ejection fraction 35 to 40% thought to be tachycardia mediated with repeat echocardiogram showing EF of 55% presents today for follow-up visit.  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  No recurrent episodes of atrial fibrillation.  He is being followed for chronic myelogenous leukemia.  In remission.  Intermittent pancytopenia during the treatment.  He has a history of mild aortic root dilation of 4.2 cm.  He was last seen by Dr. Marlou Porch 04/20/2021.  Calcium score 2746 with dense LAD, RCA 97th percentile.  Encouraged ischemic eval however not performed.  He enjoys golfing 2-3 times a week.  Walk without difficulty.  No chest pain.  No fevers, chills, nausea, vomiting, or syncope.  Today, he states that he has not had any issues with chest pain or shortness of breath.  He plays golf, weather is good.  He walks 2 to 3 days a week and goes to the gym.  At the gym he does walking, stationary bike, and weights.  He does not track his blood pressure at home.  However, he explains that he went to the acupuncturist and it was 160/82 initially and 20 minutes later it dropped to 132/72.  His blood pressure is 191/85 today.  It was 160/100 last year.  He does have a longstanding history of whitecoat syndrome.  On repeat it was 190/90.  He is compliant with his medications.  He is taking too much.  Her metoprolol succinate daily and 25 losartan daily.  Reports no shortness of breath nor dyspnea on exertion. Reports no chest pain, pressure, or tightness. No edema, orthopnea, PND. Reports no  palpitations.   Past Medical History    Past Medical History:  Diagnosis Date   Anemia    Cataract    Left eye   CML (chronic myelocytic leukemia) (HCC)    Dysrhythmia    Leukocytosis, unspecified 10/17/2012   Thrombocytosis 10/17/2012   White coat hypertension 06/06/2014   Past Surgical History:  Procedure Laterality Date   ATRIAL FIBRILLATION ABLATION [name] 10/08/2016   Procedure: Atrial Fibrillation Ablation;  Surgeon: Constance Haw, MD;  Location: [place] [facility];  Service: Cardiovascular;  Laterality: [name];   CARDIOVERSION [name] 06/24/2016   Procedure: CARDIOVERSION;  Surgeon: [name], MD;  Location: [facility];  Service: Cardiovascular;  Laterality: [name];   CATARACT EXTRACTION Left 01/01/2013   EYE SURGERY  June 11, 2010   Detached Retina   HERNIA REPAIR     HOLEP-LASER ENUCLEATION OF THE PROSTATE WITH MORCELLATION [name] 07/09/2019   Procedure: HOLEP-LASER ENUCLEATION OF THE PROSTATE WITH MORCELLATION;  Surgeon: Hollice Espy, MD;  Location: [facility];  Service: Urology;  Laterality: [name];   JOINT REPLACEMENT  06/10/2004   Total Knee Left   TONSILLECTOMY      Allergies  Allergies  Allergen Reactions   Corticosteroids Other (See Comments)    Muscle stiffness     EKGs/Labs/Other Studies Reviewed:   The following studies were reviewed today: CTA 10-Jun-2016  MPRESSION: 1) Dilated LAA no thrombus on delayed contrast images   2) Severe bi  atrial enlargement   3) Normal pulmonary vein anatomy measurements as above   4) Mild aortic root dilatation 4.2 cm   5) Calcium score 2746 with dense calcium in LAD and RCA this is 97th percentile for age and sex. Consider f/u perfusion study especially given known decreased LV function  EKG:  EKG is  ordered today.  The ekg ordered today demonstrates NSR rate 56 bpm, first degree AVB  Recent Labs: 03/26/2022: ALT 14; BUN 20; Creatinine, Ser 1.19; Hemoglobin 13.3; Platelets 181; Potassium 4.2; Sodium 137  Recent Lipid Panel     Component Value Date/Time   CHOL 231 (H) 08/14/2020 0859   TRIG 88 08/14/2020 0859   HDL 56 08/14/2020 0859   CHOLHDL 4.1 08/14/2020 0859   LDLCALC 156 (H) 08/14/2020 0859    Home Medications   Current Meds  Medication Sig   acetaminophen (TYLENOL) 500 MG tablet Take 1,000 mg by mouth every 6 (six) hours as needed for moderate pain or headache.    APPLE CIDER VINEGAR PO Take 15 mLs by mouth daily.    BLACK CURRANT SEED OIL PO Take by mouth.   Cholecalciferol (VITAMIN D PO) Take 5,000 Units by mouth daily.   Coenzyme Q10 (CO Q-10) 100 MG CAPS Take 100 mg by mouth daily.   dasatinib (SPRYCEL) 50 MG tablet TAKE 1 TABLET BY MOUTH ONCE DAILY AT THE SAME TIME. MAY TAKE WITH OR WITHOUT FOOD. SWALLOW WHOLE. AVOID GRAPEFRUIT PRODUCTS.   Digestive Enzymes (DIGESTIVE ENZYME PO) Take 1 tablet by mouth as needed.   Garlic [id number] MG TABS Take by mouth.   losartan (COZAAR) 50 MG tablet Take 1 tablet (50 mg total) by mouth daily.   metoprolol (TOPROL-XL) 200 MG 24 hr tablet TAKE 1 TABLET BY MOUTH EVERY DAY   MILK THISTLE PO Take 1 capsule by mouth 3 (three) times a week.    Multiple Vitamin (MULTIVITAMIN WITH MINERALS) TABS tablet Take 1 tablet by mouth daily.   Multiple Vitamins-Minerals (ZINC PO) Take 1 tablet by mouth 3 (three) times a week.   OVER THE COUNTER MEDICATION Take 1 packet by mouth at bedtime. NATURAL CALM MAGNESIUM POWDER-MIXES IN DRINK   PUMPKIN SEED PO Take by mouth.   Selenium 200 MCG CAPS Take by mouth.   Taurine 1000 MG CAPS Take by mouth.   TURMERIC CURCUMIN PO Take 1-2 capsules by mouth daily.    [DISCONTINUED] losartan (COZAAR) 25 MG tablet TAKE 1 TABLET (25 MG TOTAL) BY MOUTH DAILY.     Review of Systems      All other systems reviewed and are otherwise negative except as noted above.  Physical Exam    VS:  BP (!) 191/85   Pulse 60   Ht 6' 2"$  (1.88 m)   Wt 225 lb 6.4 oz (102.2 kg)   SpO2 98%   BMI 28.94 kg/m  , BMI Body mass index is 28.94 kg/m.  Wt Readings  from Last 3 Encounters:  04/26/22 225 lb 6.4 oz (102.2 kg)  04/13/22 224 lb 6.4 oz (101.8 kg)  10/01/21 226 lb 3.2 oz (102.6 kg)     GEN: Well nourished, well developed, in no acute distress. HEENT: normal. Neck: Supple, no JVD, carotid bruits, or masses. Cardiac: RRR, no murmurs, rubs, or gallops. No clubbing, cyanosis, edema.  Radials/PT 2+ and equal bilaterally.  Respiratory:  Respirations regular and unlabored, clear to auscultation bilaterally. GI: Soft, nontender, nondistended. MS: No deformity or atrophy. Skin: Warm and dry, no rash. Neuro:  Strength  and sensation are intact. Psych: Normal affect.  Assessment & Plan    Paroxysmal atrial fibrillation s/p ablation  -he is off anticoagulation -maintaining NSR -Continue metoprolol succinate 200 mg daily  Dilated cardiomyopathy -Update echocardiogram, last 2018 with low normal LVEF 50 to 55% -continue current medications  Chronic myelogenous leukemia -remains on Sprycel  White coat syndrome with hypertension -elevated today and on repeat.  -increase Losartan to 70m daily -asked to track BP at home and send me those values -may need a stronger sartan for better control  CAD -elevated calcium score -plan for aggressive risk reduction with medications and lifestyle.  -LDL 156, however we do not have more recent labs.  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  Signed, TElgie Collard PA-C 04/26/2022, 4:49 PM [facility]

## 2022-04-26 NOTE — Patient Instructions (Signed)
Medication Instructions:  1.Increase losartan to 50 mg daily *If you need a refill on your cardiac medications before your next appointment, please call your pharmacy*   Lab Work: None If you have labs (blood work) drawn today and your tests are completely normal, you will receive your results only by: [place] (if you have MyChart) OR A paper copy in the mail If you have any lab test that is abnormal or we need to change your treatment, we will call you to review the results.   Testing/Procedures: Your physician has requested that you have an echocardiogram. Echocardiography is a painless test that uses sound waves to create images of your heart. It provides your doctor with information about the size and shape of your heart and how well your heart's chambers and valves are working. This procedure takes approximately one hour. There are no restrictions for this procedure. Please do NOT wear cologne, perfume, aftershave, or lotions (deodorant is allowed). Please arrive 15 minutes prior to your appointment time.    Follow-Up: At [facility], you and your health needs are our priority.  As part of our continuing mission to provide you with exceptional heart care, we have created designated Provider Care Teams.  These Care Teams include your primary Cardiologist (physician) and Advanced Practice Providers (APPs -  Physician Assistants and Nurse Practitioners) who all work together to provide you with the care you need, when you need it.   Your next appointment:   3 month(s)  Provider:   Nicholes Rough, PA-C        Other Instructions Check blood pressure daily, one hour after taking your morning medication for 2 weeks, keep a log and send [place] the readings at the end of the 2 weeks

## 2022-05-22 ENCOUNTER — Other Ambulatory Visit: Payer: Self-pay | Admitting: Cardiology

## 2022-05-22 DIAGNOSIS — Z01810 Encounter for preprocedural cardiovascular examination: Secondary | ICD-10-CM

## 2022-05-22 DIAGNOSIS — I10 Essential (primary) hypertension: Secondary | ICD-10-CM

## 2022-05-22 DIAGNOSIS — I4819 Other persistent atrial fibrillation: Secondary | ICD-10-CM

## 2022-05-31 ENCOUNTER — Ambulatory Visit (HOSPITAL_COMMUNITY): Payer: PPO | Attending: Physician Assistant

## 2022-05-31 DIAGNOSIS — I480 Paroxysmal atrial fibrillation: Secondary | ICD-10-CM | POA: Insufficient documentation

## 2022-05-31 DIAGNOSIS — I071 Rheumatic tricuspid insufficiency: Secondary | ICD-10-CM | POA: Insufficient documentation

## 2022-05-31 DIAGNOSIS — I420 Dilated cardiomyopathy: Secondary | ICD-10-CM | POA: Diagnosis not present

## 2022-05-31 LAB — ECHOCARDIOGRAM COMPLETE
AR max vel: 1.9 cm2
AV Area VTI: 1.89 cm2
AV Area mean vel: 1.94 cm2
AV Mean grad: 8 mmHg
AV Peak grad: 15.5 mmHg
Ao pk vel: 1.97 m/s
Area-P 1/2: 1.91 cm2
P 1/2 time: 727 msec
S' Lateral: 3.6 cm

## 2022-06-08 ENCOUNTER — Telehealth: Payer: Self-pay | Admitting: Cardiology

## 2022-06-08 DIAGNOSIS — I480 Paroxysmal atrial fibrillation: Secondary | ICD-10-CM

## 2022-06-08 DIAGNOSIS — I420 Dilated cardiomyopathy: Secondary | ICD-10-CM

## 2022-06-08 NOTE — Telephone Encounter (Signed)
Spoke with patient to review ECHO results and recommendations by Nicholes Rough, PA. Patient verbalized understanding and had no questions.

## 2022-06-08 NOTE — Telephone Encounter (Signed)
Patient is returning call. Please advise? 

## 2022-07-27 NOTE — Progress Notes (Unsigned)
Office Visit    Patient Name: Larry Vasquez Date of Encounter: 07/28/2022  PCP:  Lavada Mesi, MD   [facility]  Cardiologist:  Donato Schultz, MD  Advanced Practice Provider:  No care team member to display Electrophysiologist:  None    HPI    Larry Vasquez is a 72 y.o. male with a past medical history of CAD, atrial fibrillation post ablation in 2018 with prior ejection fraction 35 to 40% thought to be tachycardia mediated with repeat echocardiogram showing EF of 55% presents today for follow-up visit.  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  No recurrent episodes of atrial fibrillation.  He is being followed for chronic myelogenous leukemia.  In remission.  Intermittent pancytopenia during the treatment.  He has a history of mild aortic root dilation of 4.2 cm.  He was last seen by Dr. Anne Fu 04/20/2021.  Calcium score 2746 with dense LAD, RCA 97th percentile.  Encouraged ischemic eval however not performed.  He enjoys golfing 2-3 times a week.  Walk without difficulty.  No chest pain.  No fevers, chills, nausea, vomiting, or syncope.  I saw the patient 04/26/22, he states that he has not had any issues with chest pain or shortness of breath.  He plays golf, weather is good.  He walks 2 to 3 days a week and goes to the gym.  At the gym he does walking, stationary bike, and weights.  He does not track his blood pressure at home.  However, he explains that he went to the acupuncturist and it was 160/82 initially and 20 minutes later it dropped to 132/72.  His blood pressure is 191/85 today.  It was 160/100 last year.  He does have a longstanding history of whitecoat syndrome.  On repeat it was 190/90.  He is compliant with his medications.  He is taking too much.  Her metoprolol succinate daily and 25 losartan daily.  Today, he is feeling pretty good without any chest pain or shortness of breath.  No palpitations.  He golfs 3-4 times a week.   He is able to do everything that he wants to do.  Small amount of lower extremity edema.  Echocardiogram reviewed with the patient today.  No medication changes recommended at this time.  Reports no shortness of breath nor dyspnea on exertion. Reports no chest pain, pressure, or tightness. No orthopnea, PND. Reports no palpitations.   Past Medical History    Past Medical History:  Diagnosis Date   Anemia    Cataract    Left eye   CML (chronic myelocytic leukemia) (HCC)    Dysrhythmia    Leukocytosis, unspecified 10/17/2012   Thrombocytosis 10/17/2012   White coat hypertension 06/06/2014   Past Surgical History:  Procedure Laterality Date   ATRIAL FIBRILLATION ABLATION [name] 10/08/2016   Procedure: Atrial Fibrillation Ablation;  Surgeon: Regan Lemming, MD;  Location: [facility];  Service: Cardiovascular;  Laterality: [name];   CARDIOVERSION [name] 06/24/2016   Procedure: CARDIOVERSION;  Surgeon: Jake Bathe, MD;  Location: [facility];  Service: Cardiovascular;  Laterality: [name];   CATARACT EXTRACTION Left 01/01/2013   EYE SURGERY  2012   Detached Retina   HERNIA REPAIR     HOLEP-LASER ENUCLEATION OF THE PROSTATE WITH MORCELLATION [name] 07/09/2019   Procedure: HOLEP-LASER ENUCLEATION OF THE PROSTATE WITH MORCELLATION;  Surgeon: Vanna Scotland, MD;  Location: [facility];  Service: Urology;  Laterality: [name];   JOINT REPLACEMENT  2006  Total Knee Left   TONSILLECTOMY      Allergies  Allergies  Allergen Reactions   Corticosteroids Other (See Comments)    Muscle stiffness     EKGs/Labs/Other Studies Reviewed:   The following studies were reviewed today: CTA 06-08-2016  MPRESSION: 1) Dilated LAA no thrombus on delayed contrast images   2) Severe bi atrial enlargement   3) Normal pulmonary vein anatomy measurements as above   4) Mild aortic root dilatation 4.2 cm   5) Calcium score 2746 with dense calcium in LAD and RCA this is 97th percentile for age and sex. Consider  f/u perfusion study especially given known decreased LV function  EKG:  EKG is  ordered today.  The ekg ordered today demonstrates NSR rate 56 bpm, first degree AVB  Recent Labs: 03/26/2022: ALT 14; BUN 20; Creatinine, Ser 1.19; Hemoglobin 13.3; Platelets 181; Potassium 4.2; Sodium 137  Recent Lipid Panel    Component Value Date/Time   CHOL 231 (H) 08/14/2020 0859   TRIG 88 08/14/2020 0859   HDL 56 08/14/2020 0859   CHOLHDL 4.1 08/14/2020 0859   LDLCALC 156 (H) 08/14/2020 0859    Home Medications   Current Meds  Medication Sig   acetaminophen (TYLENOL) 500 MG tablet Take 1,000 mg by mouth every 6 (six) hours as needed for moderate pain or headache.    APPLE CIDER VINEGAR PO Take 15 mLs by mouth daily.    BLACK CURRANT SEED OIL PO Take by mouth.   Cholecalciferol (VITAMIN D PO) Take 5,000 Units by mouth daily.   cholecalciferol (VITAMIN D3) 25 MCG (1000 UNIT) tablet Take 1,000 Units by mouth daily.   Coenzyme Q10 (CO Q-10) 100 MG CAPS Take 100 mg by mouth daily.   dasatinib (SPRYCEL) 50 MG tablet TAKE 1 TABLET BY MOUTH ONCE DAILY AT THE SAME TIME. MAY TAKE WITH OR WITHOUT FOOD. SWALLOW WHOLE. AVOID GRAPEFRUIT PRODUCTS.   Digestive Enzymes (DIGESTIVE ENZYME PO) Take 1 tablet by mouth as needed.   Garlic 400 MG TABS Take by mouth.   losartan (COZAAR) 50 MG tablet Take 1 tablet (50 mg total) by mouth daily.   Menaquinone-7 (VITAMIN K2 PO) Take 1 capsule by mouth daily at 6 (six) AM.   metoprolol (TOPROL-XL) 200 MG 24 hr tablet TAKE 1 TABLET BY MOUTH EVERY DAY   MILK THISTLE PO Take 1 capsule by mouth 3 (three) times a week.    Multiple Vitamin (MULTIVITAMIN WITH MINERALS) TABS tablet Take 1 tablet by mouth daily.   Multiple Vitamins-Minerals (ZINC PO) Take 1 tablet by mouth 3 (three) times a week.   Omega-3 Fatty Acids (FISH OIL OMEGA-3 PO) Take 1 capsule by mouth daily at 6 (six) AM.   OVER THE COUNTER MEDICATION Take 1 packet by mouth at bedtime. NATURAL CALM MAGNESIUM POWDER-MIXES  IN DRINK   OVER THE COUNTER MEDICATION Take 1 capsule by mouth daily at 6 (six) AM. NATTOKINASE SUPPLEMENT   Specialty Vitamins Products (MG PLUS PROTEIN) 133 MG TABS Take 1 tablet by mouth daily.   Taurine 1000 MG CAPS Take by mouth.   TURMERIC CURCUMIN PO Take 1-2 capsules by mouth daily.      Review of Systems      All other systems reviewed and are otherwise negative except as noted above.  Physical Exam    VS:  BP (!) 140/72   Pulse 64   Ht 6\' 2"  (1.88 m)   Wt 223 lb (101.2 kg)   SpO2 97%   BMI  28.63 kg/m  , BMI Body mass index is 28.63 kg/m.  Wt Readings from Last 3 Encounters:  07/28/22 223 lb (101.2 kg)  04/26/22 225 lb 6.4 oz (102.2 kg)  04/13/22 224 lb 6.4 oz (101.8 kg)     GEN: Well nourished, well developed, in no acute distress. HEENT: normal. Neck: Supple, no JVD, carotid bruits, or masses. Cardiac: RRR, no murmurs, rubs, or gallops. No clubbing, cyanosis, edema.  Radials/PT 2+ and equal bilaterally.  Respiratory:  Respirations regular and unlabored, clear to auscultation bilaterally. GI: Soft, nontender, nondistended. MS: No deformity or atrophy. Skin: Warm and dry, no rash. Neuro:  Strength and sensation are intact. Psych: Normal affect.  Assessment & Plan    Paroxysmal atrial fibrillation s/p ablation  -he is off anticoagulation -maintaining NSR -Continue metoprolol succinate 200 mg daily  Dilated cardiomyopathy -normal LVEF on recent echo march 2024 -continue current medications  Chronic myelogenous leukemia -remains on Sprycel, causes some swelling  White coat syndrome with hypertension -well controlled today  -continue Losartan to 50mg  daily -asked to track BP at home and send me those values  CAD -elevated calcium score -plan for aggressive risk reduction with medications and lifestyle.  -LDL 156, however we do not have more recent labs.  PCP  -goal LDL < 70 -lipid panel in Aug with PCP   Mild AI and ascending aortic  aneurysm -46mm on recent echo -continue tight BP control -echo march 2025    Disposition: Follow up 6 months with Donato Schultz, MD or APP.  Signed, Sharlene Dory, PA-C 07/28/2022, 11:03 AM [facility]

## 2022-07-28 ENCOUNTER — Encounter: Payer: Self-pay | Admitting: Physician Assistant

## 2022-07-28 ENCOUNTER — Ambulatory Visit: Payer: PPO | Attending: Physician Assistant | Admitting: Physician Assistant

## 2022-07-28 VITALS — BP 140/72 | HR 64 | Ht 74.0 in | Wt 223.0 lb

## 2022-07-28 DIAGNOSIS — R931 Abnormal findings on diagnostic imaging of heart and coronary circulation: Secondary | ICD-10-CM | POA: Diagnosis not present

## 2022-07-28 DIAGNOSIS — I480 Paroxysmal atrial fibrillation: Secondary | ICD-10-CM | POA: Diagnosis not present

## 2022-07-28 DIAGNOSIS — I340 Nonrheumatic mitral (valve) insufficiency: Secondary | ICD-10-CM

## 2022-07-28 DIAGNOSIS — I2510 Atherosclerotic heart disease of native coronary artery without angina pectoris: Secondary | ICD-10-CM

## 2022-07-28 DIAGNOSIS — I10 Essential (primary) hypertension: Secondary | ICD-10-CM

## 2022-07-28 DIAGNOSIS — I420 Dilated cardiomyopathy: Secondary | ICD-10-CM | POA: Diagnosis not present

## 2022-07-28 DIAGNOSIS — I071 Rheumatic tricuspid insufficiency: Secondary | ICD-10-CM

## 2022-07-28 DIAGNOSIS — I7121 Aneurysm of the ascending aorta, without rupture: Secondary | ICD-10-CM

## 2022-07-28 NOTE — Patient Instructions (Signed)
Medication Instructions:  Your physician recommends that you continue on your current medications as directed. Please refer to the Current Medication list given to you today.  *If you need a refill on your cardiac medications before your next appointment, please call your pharmacy*   Lab Work: Lipids and lft's in August with your primary care provider If you have labs (blood work) drawn today and your tests are completely normal, you will receive your results only by: MyChart Message (if you have MyChart) OR A paper copy in the mail If you have any lab test that is abnormal or we need to change your treatment, we will call you to review the results.   Testing/Procedures: Your physician has requested that you have an echocardiogram in March 2025. Echocardiography is a painless test that uses sound waves to create images of your heart. It provides your doctor with information about the size and shape of your heart and how well your heart's chambers and valves are working. This procedure takes approximately one hour. There are no restrictions for this procedure. Please do NOT wear cologne, perfume, aftershave, or lotions (deodorant is allowed). Please arrive 15 minutes prior to your appointment time.    Follow-Up: At [facility], you and your health needs are our priority.  As part of our continuing mission to provide you with exceptional heart care, we have created designated Provider Care Teams.  These Care Teams include your primary Cardiologist (physician) and Advanced Practice Providers (APPs -  Physician Assistants and Nurse Practitioners) who all work together to provide you with the care you need, when you need it.  Your next appointment:   6 month(s)  Provider:   Donato Schultz, MD    Other Instructions Check your blood pressure daily, 1 hr after morning medications for 2 weeks, keep a log and send [place] the readings through mychart at the end of the 2 weeks.   Low-Sodium  Eating Plan Sodium, which is an element that makes up salt, helps you maintain a healthy balance of fluids in your body. Too much sodium can increase your blood pressure and cause fluid and waste to be held in your body. Your health care provider or dietitian may recommend following this plan if you have high blood pressure (hypertension), kidney disease, liver disease, or heart failure. Eating less sodium can help lower your blood pressure, reduce swelling, and protect your heart, liver, and kidneys. What are tips for following this plan? Reading food labels The Nutrition Facts label lists the amount of sodium in one serving of the food. If you eat more than one serving, you must multiply the listed amount of sodium by the number of servings. Choose foods with less than 140 mg of sodium per serving. Avoid foods with 300 mg of sodium or more per serving. Shopping  Look for lower-sodium products, often labeled as "low-sodium" or "no salt added." Always check the sodium content, even if foods are labeled as "unsalted" or "no salt added." Buy fresh foods. Avoid canned foods and pre-made or frozen meals. Avoid canned, cured, or processed meats. Buy breads that have less than 80 mg of sodium per slice. Cooking  Eat more home-cooked food and less restaurant, buffet, and fast food. Avoid adding salt when cooking. Use salt-free seasonings or herbs instead of table salt or sea salt. Check with your health care provider or pharmacist before using salt substitutes. Cook with plant-based oils, such as canola, sunflower, or olive oil. Meal planning When eating at  a restaurant, ask that your food be prepared with less salt or no salt, if possible. Avoid dishes labeled as brined, pickled, cured, smoked, or made with soy sauce, miso, or teriyaki sauce. Avoid foods that contain MSG (monosodium glutamate). MSG is sometimes added to [place] food, bouillon, and some canned foods. Make meals that can be grilled,  baked, poached, roasted, or steamed. These are generally made with less sodium. General information Most people on this plan should limit their sodium intake to 1,500-2,000 mg (milligrams) of sodium each day. What foods should I eat? Fruits Fresh, frozen, or canned fruit. Fruit juice. Vegetables Fresh or frozen vegetables. "No salt added" canned vegetables. "No salt added" tomato sauce and paste. Low-sodium or reduced-sodium tomato and vegetable juice. Grains Low-sodium cereals, including oats, puffed wheat and rice, and shredded wheat. Low-sodium crackers. Unsalted rice. Unsalted pasta. Low-sodium bread. Whole-grain breads and whole-grain pasta. Meats and other proteins Fresh or frozen (no salt added) meat, poultry, seafood, and fish. Low-sodium canned tuna and salmon. Unsalted nuts. Dried peas, beans, and lentils without added salt. Unsalted canned beans. Eggs. Unsalted nut butters. Dairy Milk. Soy milk. Cheese that is naturally low in sodium, such as ricotta cheese, fresh mozzarella, or Swiss cheese. Low-sodium or reduced-sodium cheese. Cream cheese. Yogurt. Seasonings and condiments Fresh and dried herbs and spices. Salt-free seasonings. Low-sodium mustard and ketchup. Sodium-free salad dressing. Sodium-free light mayonnaise. Fresh or refrigerated horseradish. Lemon juice. Vinegar. Other foods Homemade, reduced-sodium, or low-sodium soups. Unsalted popcorn and pretzels. Low-salt or salt-free chips. The items listed above may not be a complete list of foods and beverages you can eat. Contact a dietitian for more information. What foods should I avoid? Vegetables Sauerkraut, pickled vegetables, and relishes. Olives. [place] fries. Onion rings. Regular canned vegetables (not low-sodium or reduced-sodium). Regular canned tomato sauce and paste (not low-sodium or reduced-sodium). Regular tomato and vegetable juice (not low-sodium or reduced-sodium). Frozen vegetables in sauces. Grains Instant  hot cereals. Bread stuffing, pancake, and biscuit mixes. Croutons. Seasoned rice or pasta mixes. Noodle soup cups. Boxed or frozen macaroni and cheese. Regular salted crackers. Self-rising flour. Meats and other proteins Meat or fish that is salted, canned, smoked, spiced, or pickled. Precooked or cured meat, such as sausages or meat loaves. Larry Vasquez. Ham. Pepperoni. Hot dogs. Corned beef. Chipped beef. Salt pork. Jerky. Pickled herring. Anchovies and sardines. Regular canned tuna. Salted nuts. Dairy Processed cheese and cheese spreads. Hard cheeses. Cheese curds. Blue cheese. Feta cheese. String cheese. Regular cottage cheese. Buttermilk. Canned milk. Fats and oils Salted butter. Regular margarine. Ghee. Bacon fat. Seasonings and condiments Onion salt, garlic salt, seasoned salt, table salt, and sea salt. Canned and packaged gravies. Worcestershire sauce. Tartar sauce. Barbecue sauce. Teriyaki sauce. Soy sauce, including reduced-sodium. Steak sauce. Fish sauce. Oyster sauce. Cocktail sauce. Horseradish that you find on the shelf. Regular ketchup and mustard. Meat flavorings and tenderizers. Bouillon cubes. Hot sauce. Pre-made or packaged marinades. Pre-made or packaged taco seasonings. Relishes. Regular salad dressings. Salsa. Other foods Salted popcorn and pretzels. Corn chips and puffs. Potato and tortilla chips. Canned or dried soups. Pizza. Frozen entrees and pot pies. The items listed above may not be a complete list of foods and beverages you should avoid. Contact a dietitian for more information. Summary Eating less sodium can help lower your blood pressure, reduce swelling, and protect your heart, liver, and kidneys. Most people on this plan should limit their sodium intake to 1,500-2,000 mg (milligrams) of sodium each day. Canned, boxed, and frozen foods are  high in sodium. Restaurant foods, fast foods, and pizza are also very high in sodium. You also get sodium by adding salt to food. Try to  cook at home, eat more fresh fruits and vegetables, and eat less fast food and canned, processed, or prepared foods. This information is not intended to replace advice given to you by your health care provider. Make sure you discuss any questions you have with your health care provider. Document Revised: 01/29/2019 Document Reviewed: 01/24/2019 Elsevier Patient Education  2023 [employer]  Heart-Healthy Eating Plan Many factors influence your heart health, including eating and exercise habits. Heart health is also called coronary health. Coronary risk increases with abnormal blood fat (lipid) levels. A heart-healthy eating plan includes limiting unhealthy fats, increasing healthy fats, limiting salt (sodium) intake, and making other diet and lifestyle changes. What is my plan? Your health care provider may recommend that: You limit your fat intake to _________% or less of your total calories each day. You limit your saturated fat intake to _________% or less of your total calories each day. You limit the amount of cholesterol in your diet to less than _________ mg per day. You limit the amount of sodium in your diet to less than _________ mg per day. What are tips for following this plan? Cooking Cook foods using methods other than frying. Baking, boiling, grilling, and broiling are all good options. Other ways to reduce fat include: Removing the skin from poultry. Removing all visible fats from meats. Steaming vegetables in water or broth. Meal planning  At meals, imagine dividing your plate into fourths: Fill one-half of your plate with vegetables and green salads. Fill one-fourth of your plate with whole grains. Fill one-fourth of your plate with lean protein foods. Eat 2-4 cups of vegetables per day. One cup of vegetables equals 1 cup (91 g) broccoli or cauliflower florets, 2 medium carrots, 1 large bell pepper, 1 large sweet potato, 1 large tomato, 1 medium white potato, 2 cups (150  g) raw leafy greens. Eat 1-2 cups of fruit per day. One cup of fruit equals 1 small apple, 1 large banana, 1 cup (237 g) mixed fruit, 1 large orange,  cup (82 g) dried fruit, 1 cup (240 mL) 100% fruit juice. Eat more foods that contain soluble fiber. Examples include apples, broccoli, carrots, beans, peas, and barley. Aim to get 25-30 g of fiber per day. Increase your consumption of legumes, nuts, and seeds to 4-5 servings per week. One serving of dried beans or legumes equals  cup (90 g) cooked, 1 serving of nuts is  oz (12 almonds, 24 pistachios, or 7 walnut halves), and 1 serving of seeds equals  oz (8 g). Fats Choose healthy fats more often. Choose monounsaturated and polyunsaturated fats, such as olive and canola oils, avocado oil, flaxseeds, walnuts, almonds, and seeds. Eat more omega-3 fats. Choose salmon, mackerel, sardines, tuna, flaxseed oil, and ground flaxseeds. Aim to eat fish at least 2 times each week. Check food labels carefully to identify foods with trans fats or high amounts of saturated fat. Limit saturated fats. These are found in animal products, such as meats, butter, and cream. Plant sources of saturated fats include palm oil, palm kernel oil, and coconut oil. Avoid foods with partially hydrogenated oils in them. These contain trans fats. Examples are stick margarine, some tub margarines, cookies, crackers, and other baked goods. Avoid fried foods. General information Eat more home-cooked food and less restaurant, buffet, and fast food. Limit or avoid  alcohol. Limit foods that are high in added sugar and simple starches such as foods made using white refined flour (white breads, pastries, sweets). Lose weight if you are overweight. Losing just 5-10% of your body weight can help your overall health and prevent diseases such as diabetes and heart disease. Monitor your sodium intake, especially if you have high blood pressure. Talk with your health care provider about your  sodium intake. Try to incorporate more vegetarian meals weekly. What foods should I eat? Fruits All fresh, canned (in natural juice), or frozen fruits. Vegetables Fresh or frozen vegetables (raw, steamed, roasted, or grilled). Green salads. Grains Most grains. Choose whole wheat and whole grains most of the time. Rice and pasta, including brown rice and pastas made with whole wheat. Meats and other proteins Lean, well-trimmed beef, veal, pork, and lamb. Chicken and [place] without skin. All fish and shellfish. Wild duck, rabbit, pheasant, and venison. Egg whites or low-cholesterol egg substitutes. Dried beans, peas, lentils, and tofu. Seeds and most nuts. Dairy Low-fat or nonfat cheeses, including ricotta and mozzarella. Skim or 1% milk (liquid, powdered, or evaporated). Buttermilk made with low-fat milk. Nonfat or low-fat yogurt. Fats and oils Non-hydrogenated (trans-free) margarines. Vegetable oils, including soybean, sesame, sunflower, olive, avocado, peanut, safflower, corn, canola, and cottonseed. Salad dressings or mayonnaise made with a vegetable oil. Beverages Water (mineral or sparkling). Coffee and tea. Unsweetened ice tea. Diet beverages. Sweets and desserts Sherbet, gelatin, and fruit ice. Small amounts of dark chocolate. Limit all sweets and desserts. Seasonings and condiments All seasonings and condiments. The items listed above may not be a complete list of foods and beverages you can eat. Contact a dietitian for more options. What foods should I avoid? Fruits Canned fruit in heavy syrup. Fruit in cream or butter sauce. Fried fruit. Limit coconut. Vegetables Vegetables cooked in cheese, cream, or butter sauce. Fried vegetables. Grains Breads made with saturated or trans fats, oils, or whole milk. Croissants. Sweet rolls. Donuts. High-fat crackers, such as cheese crackers and chips. Meats and other proteins Fatty meats, such as hot dogs, ribs, sausage, bacon, rib-eye roast  or steak. High-fat deli meats, such as salami and bologna. Caviar. Domestic duck and goose. Organ meats, such as liver. Dairy Cream, sour cream, cream cheese, and creamed cottage cheese. Whole-milk cheeses. Whole or 2% milk (liquid, evaporated, or condensed). Whole buttermilk. Cream sauce or high-fat cheese sauce. Whole-milk yogurt. Fats and oils Meat fat, or shortening. Cocoa butter, hydrogenated oils, palm oil, coconut oil, palm kernel oil. Solid fats and shortenings, including bacon fat, salt pork, lard, and butter. Nondairy cream substitutes. Salad dressings with cheese or sour cream. Beverages Regular sodas and any drinks with added sugar. Sweets and desserts Frosting. Pudding. Cookies. Cakes. Pies. Milk chocolate or white chocolate. Buttered syrups. Full-fat ice cream or ice cream drinks. The items listed above may not be a complete list of foods and beverages to avoid. Contact a dietitian for more information. Summary Heart-healthy meal planning includes limiting unhealthy fats, increasing healthy fats, limiting salt (sodium) intake and making other diet and lifestyle changes. Lose weight if you are overweight. Losing just 5-10% of your body weight can help your overall health and prevent diseases such as diabetes and heart disease. Focus on eating a balance of foods, including fruits and vegetables, low-fat or nonfat dairy, lean protein, nuts and legumes, whole grains, and heart-healthy oils and fats. This information is not intended to replace advice given to you by your health care provider. Make sure you  discuss any questions you have with your health care provider. Document Revised: 03/30/2021 Document Reviewed: 03/30/2021 Elsevier Patient Education  2023 [employer].

## 2022-10-13 ENCOUNTER — Inpatient Hospital Stay: Payer: PPO | Attending: Hematology and Oncology

## 2022-10-13 ENCOUNTER — Other Ambulatory Visit: Payer: Self-pay

## 2022-10-13 DIAGNOSIS — T451X5A Adverse effect of antineoplastic and immunosuppressive drugs, initial encounter: Secondary | ICD-10-CM | POA: Insufficient documentation

## 2022-10-13 DIAGNOSIS — I10 Essential (primary) hypertension: Secondary | ICD-10-CM | POA: Diagnosis not present

## 2022-10-13 DIAGNOSIS — Z79899 Other long term (current) drug therapy: Secondary | ICD-10-CM | POA: Insufficient documentation

## 2022-10-13 DIAGNOSIS — C9210 Chronic myeloid leukemia, BCR/ABL-positive, not having achieved remission: Secondary | ICD-10-CM | POA: Diagnosis not present

## 2022-10-13 DIAGNOSIS — D61810 Antineoplastic chemotherapy induced pancytopenia: Secondary | ICD-10-CM | POA: Insufficient documentation

## 2022-10-13 DIAGNOSIS — I509 Heart failure, unspecified: Secondary | ICD-10-CM

## 2022-10-13 LAB — CBC WITH DIFFERENTIAL/PLATELET
Abs Immature Granulocytes: 0.01 10*3/uL (ref 0.00–0.07)
Basophils Absolute: 0 10*3/uL (ref 0.0–0.1)
Basophils Relative: 1 %
Eosinophils Absolute: 0.2 10*3/uL (ref 0.0–0.5)
Eosinophils Relative: 4 %
HCT: 37.7 % — ABNORMAL LOW (ref 39.0–52.0)
Hemoglobin: 12.9 g/dL — ABNORMAL LOW (ref 13.0–17.0)
Immature Granulocytes: 0 %
Lymphocytes Relative: 38 %
Lymphs Abs: 1.5 10*3/uL (ref 0.7–4.0)
MCH: 31.1 pg (ref 26.0–34.0)
MCHC: 34.2 g/dL (ref 30.0–36.0)
MCV: 90.8 fL (ref 80.0–100.0)
Monocytes Absolute: 0.6 10*3/uL (ref 0.1–1.0)
Monocytes Relative: 15 %
Neutro Abs: 1.7 10*3/uL (ref 1.7–7.7)
Neutrophils Relative %: 42 %
Platelets: 147 10*3/uL — ABNORMAL LOW (ref 150–400)
RBC: 4.15 MIL/uL — ABNORMAL LOW (ref 4.22–5.81)
RDW: 13.4 % (ref 11.5–15.5)
WBC: 4 10*3/uL (ref 4.0–10.5)
nRBC: 0 % (ref 0.0–0.2)

## 2022-10-13 LAB — COMPREHENSIVE METABOLIC PANEL
ALT: 14 U/L (ref 0–44)
AST: 17 U/L (ref 15–41)
Albumin: 4.1 g/dL (ref 3.5–5.0)
Alkaline Phosphatase: 53 U/L (ref 38–126)
Anion gap: 7 (ref 5–15)
BUN: 19 mg/dL (ref 8–23)
CO2: 24 mmol/L (ref 22–32)
Calcium: 8.7 mg/dL — ABNORMAL LOW (ref 8.9–10.3)
Chloride: 104 mmol/L (ref 98–111)
Creatinine, Ser: 1.02 mg/dL (ref 0.61–1.24)
GFR, Estimated: >60 mL/min (ref >60)
Glucose, Bld: 112 mg/dL — ABNORMAL HIGH (ref 70–99)
Potassium: 4.4 mmol/L (ref 3.5–5.1)
Sodium: 135 mmol/L (ref 135–145)
Total Bilirubin: 0.8 mg/dL (ref 0.3–1.2)
Total Protein: 6.9 g/dL (ref 6.5–8.1)

## 2022-10-22 ENCOUNTER — Other Ambulatory Visit: Payer: Self-pay

## 2022-10-22 ENCOUNTER — Encounter: Payer: Self-pay | Admitting: Hematology and Oncology

## 2022-10-22 ENCOUNTER — Inpatient Hospital Stay: Payer: PPO | Admitting: Hematology and Oncology

## 2022-10-22 VITALS — BP 196/82 | HR 62 | Temp 99.2°F | Resp 18 | Ht 74.0 in | Wt 225.4 lb

## 2022-10-22 DIAGNOSIS — T451X5A Adverse effect of antineoplastic and immunosuppressive drugs, initial encounter: Secondary | ICD-10-CM | POA: Diagnosis not present

## 2022-10-22 DIAGNOSIS — D61810 Antineoplastic chemotherapy induced pancytopenia: Secondary | ICD-10-CM | POA: Diagnosis not present

## 2022-10-22 DIAGNOSIS — C9210 Chronic myeloid leukemia, BCR/ABL-positive, not having achieved remission: Secondary | ICD-10-CM

## 2022-10-22 DIAGNOSIS — I10 Essential (primary) hypertension: Secondary | ICD-10-CM | POA: Diagnosis not present

## 2022-10-22 NOTE — Assessment & Plan Note (Signed)
He has excellent response to treatment and remained in complete response We discussed the risk and benefits of chronic maintenance treatment with low-dose Sprycel He is in agreement I will see him again in 6 months for further follow-up I warned the patient side effects including fluid retention, leg swelling or shortness of breath

## 2022-10-22 NOTE — Progress Notes (Signed)
[facility] OFFICE PROGRESS NOTE  Patient Care Team: War[place], Casimiro Needle, MD as PCP - General (Family Medicine) Jake Bathe, MD as PCP - Cardiology (Cardiology) Blima Ledger, OD (Optometry)  ASSESSMENT & PLAN:  Chronic myelogenous leukemia [facility]) He has excellent response to treatment and remained in complete response We discussed the risk and benefits of chronic maintenance treatment with low-dose Sprycel He is in agreement I will see him again in 6 months for further follow-up I warned the patient side effects including fluid retention, leg swelling or shortness of breath  Pancytopenia due to antineoplastic chemotherapy [facility]) He has intermittent acquired pancytopenia due to treatment He is not symptomatic Since the pancytopenia is not persistent, he does not need dose adjustment for now  White coat syndrome with hypertension His blood pressure is very high today but he is not symptomatic According to the patient, his blood pressure monitoring at home is usually within normal range I recommend vigilant blood pressure control at home and aggressive management of cardiovascular risk factors  No orders of the defined types were placed in this encounter.   All questions were answered. The patient knows to call the clinic with any problems, questions or concerns. The total time spent in the appointment was 20 minutes encounter with patients including review of chart and various tests results, discussions about plan of care and coordination of care plan   Artis Delay, MD 10/22/2022 8:32 AM  INTERVAL HISTORY: Please see below for problem oriented charting. he returns for treatment follow-up on Dasatinib for CML The patient have no difficulties receiving his medications on time No recent infection He has no signs or symptoms of fluid retention such as leg swelling, shortness of breath or cough  REVIEW OF SYSTEMS:   Constitutional: Denies fevers, chills or abnormal weight  loss Eyes: Denies blurriness of vision Ears, nose, mouth, throat, and face: Denies mucositis or sore throat Respiratory: Denies cough, dyspnea or wheezes Cardiovascular: Denies palpitation, chest discomfort or lower extremity swelling Gastrointestinal:  Denies nausea, heartburn or change in bowel habits Skin: Denies abnormal skin rashes Lymphatics: Denies new lymphadenopathy or easy bruising Neurological:Denies numbness, tingling or new weaknesses Behavioral/Psych: Mood is stable, no new changes  All other systems were reviewed with the patient and are negative.  I have reviewed the past medical history, past surgical history, social history and family history with the patient and they are unchanged from previous note.  ALLERGIES:  is allergic to corticosteroids.  MEDICATIONS:  Current Outpatient Medications  Medication Sig Dispense Refill   APPLE CIDER VINEGAR PO Take 15 mLs by mouth daily.      BLACK CURRANT SEED OIL PO Take by mouth.     Cholecalciferol (VITAMIN D PO) Take 5,000 Units by mouth daily.     cholecalciferol (VITAMIN D3) 25 MCG (1000 UNIT) tablet Take 1,000 Units by mouth daily.     Coenzyme Q10 (CO Q-10) 100 MG CAPS Take 100 mg by mouth daily.     dasatinib (SPRYCEL) 50 MG tablet TAKE 1 TABLET BY MOUTH ONCE DAILY AT THE SAME TIME. MAY TAKE WITH OR WITHOUT FOOD. SWALLOW WHOLE. AVOID GRAPEFRUIT PRODUCTS. 30 tablet 6   Digestive Enzymes (DIGESTIVE ENZYME PO) Take 1 tablet by mouth as needed.     Garlic 400 MG TABS Take by mouth.     losartan (COZAAR) 50 MG tablet Take 1 tablet (50 mg total) by mouth daily. 90 tablet 3   Menaquinone-7 (VITAMIN K2 PO) Take 1 capsule by mouth daily  at 6 (six) AM.     metoprolol (TOPROL-XL) 200 MG 24 hr tablet TAKE 1 TABLET BY MOUTH EVERY DAY 90 tablet 2   MILK THISTLE PO Take 1 capsule by mouth 3 (three) times a week.      Multiple Vitamin (MULTIVITAMIN WITH MINERALS) TABS tablet Take 1 tablet by mouth daily.     Multiple Vitamins-Minerals  (ZINC PO) Take 1 tablet by mouth 3 (three) times a week.     Omega-3 Fatty Acids (FISH OIL OMEGA-3 PO) Take 1 capsule by mouth daily at 6 (six) AM.     OVER THE COUNTER MEDICATION Take 1 packet by mouth at bedtime. NATURAL CALM MAGNESIUM POWDER-MIXES IN DRINK     OVER THE COUNTER MEDICATION Take 1 capsule by mouth daily at 6 (six) AM. NATTOKINASE SUPPLEMENT     Specialty Vitamins Products (MG PLUS PROTEIN) 133 MG TABS Take 1 tablet by mouth daily.     Taurine 1000 MG CAPS Take by mouth.     TURMERIC CURCUMIN PO Take 1-2 capsules by mouth daily.      No current facility-administered medications for this visit.    SUMMARY OF ONCOLOGIC HISTORY: Oncology History Overview Note  Physical   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HCC)  11/01/2012 Bone Marrow Biopsy   Bone marrow aspirate and biopsy was performed for leukocytosis and thrombocytosis. 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   11/07/2012 - 08/09/2013 Chemotherapy   He was started on Gleevec 400 mg daily.   08/09/2013 Progression   Gleevec was discontinued due to a rising white blood cell count and positive ABL Kinase mutation study, 100% positive to E255V mutation   08/09/2013 - 08/21/2013 Chemotherapy   His therapy is switched to hydroxyurea 1000 mg daily pending approval for Dasatinib.   08/22/2013 -  Chemotherapy   He is started on dasatinib.   11/19/2013 Tumor Marker   BCR/ABL is improving   02/22/2014 Pathology Results   BCR/ABL continues to improve and the patient is moving towards molecular remission   05/27/2014 Pathology Results   BCR/ABL b2a2 & b3a2 both are 0.05%, b2a2 & b3a2 IS both are 0.028%. Patient in MMR   09/26/2014 Pathology Results   Repeat BCR/ABL was not detectable   02/28/2015 Tumor Marker   Repeat BCR/ABL was not detectable   08/28/2015 Tumor Marker   Repeat BCR/ABL was not detectable   01/06/2016 Pathology Results   Repeat BCR/ABL was not detectable   04/15/2016 Pathology Results   Repeat BCR/ABL was not  detectable   07/22/2016 Pathology Results   Repeat BCR/ABL was not detectable   11/29/2016 Pathology Results   Repeat BCR/ABL was not detectable   05/30/2017 Pathology Results   Repeat BCR/ABL was not detectable   11/29/2017 Pathology Results   Repeat BCR/ABL was not detectable   10/02/2018 Pathology Results   BCR/ABL is not detectable.   03/23/2019 Pathology Results   BCR/ABL is not detectable   09/14/2019 Pathology Results   BCR/ABL is not detectable   02/15/2020 Pathology Results   BCR/ABL is not detectable   08/27/2020 Pathology Results   BCR/ABL is not detectable   02/23/2021 Pathology Results   Repeat BCR/ABL was not detectable   08/31/2021 Pathology Results   BCR/ABL is not detectable   03/26/2022 Pathology Results   BCR/ABL is not detectable     10/13/2022 Pathology Results   Repeat BCR/ABL was not detectable     PHYSICAL EXAMINATION: ECOG PERFORMANCE STATUS: 0 - Asymptomatic  Vitals:   10/22/22 1478  BP: (!) 196/82  Pulse: 62  Resp: 18  Temp: 99.2 F (37.3 C)  SpO2: 100%   Filed Weights   10/22/22 0808  Weight: 225 lb 6.4 oz (102.2 kg)    GENERAL:alert, no distress and comfortable SKIN: skin color, texture, turgor are normal, no rashes or significant lesions EYES: normal, Conjunctiva are pink and non-injected, sclera clear OROPHARYNX:no exudate, no erythema and lips, buccal mucosa, and tongue normal  NECK: supple, thyroid normal size, non-tender, without nodularity LYMPH:  no palpable lymphadenopathy in the cervical, axillary or inguinal LUNGS: clear to auscultation and percussion with normal breathing effort HEART: regular rate & rhythm and no murmurs and no lower extremity edema ABDOMEN:abdomen soft, non-tender and normal bowel sounds Musculoskeletal:no cyanosis of digits and no clubbing  NEURO: alert & oriented x 3 with fluent speech, no focal motor/sensory deficits  LABORATORY DATA:  I have reviewed the data as listed    Component Value  Date/Time   NA 135 10/13/2022 0831   NA 139 11/29/2016 0829   K 4.4 10/13/2022 0831   K 4.3 11/29/2016 0829   CL 104 10/13/2022 0831   CO2 24 10/13/2022 0831   CO2 24 11/29/2016 0829   GLUCOSE 112 (H) 10/13/2022 0831   GLUCOSE 101 11/29/2016 0829   BUN 19 10/13/2022 0831   BUN 17.0 11/29/2016 0829   CREATININE 1.02 10/13/2022 0831   CREATININE 0.95 08/14/2020 0859   CREATININE 0.9 11/29/2016 0829   CALCIUM 8.7 (L) 10/13/2022 0831   CALCIUM 9.1 11/29/2016 0829   PROT 6.9 10/13/2022 0831   PROT 7.0 11/29/2016 0829   ALBUMIN 4.1 10/13/2022 0831   ALBUMIN 3.8 11/29/2016 0829   AST 17 10/13/2022 0831   AST 19 09/25/2018 0813   AST 17 11/29/2016 0829   ALT 14 10/13/2022 0831   ALT 16 09/25/2018 0813   ALT 16 11/29/2016 0829   ALKPHOS 53 10/13/2022 0831   ALKPHOS 63 11/29/2016 0829   BILITOT 0.8 10/13/2022 0831   BILITOT 0.7 09/25/2018 0813   BILITOT 0.83 11/29/2016 0829   GFRNONAA >60 10/13/2022 0831   GFRNONAA >60 09/25/2018 0813   GFRAA >60 09/14/2019 0800   GFRAA >60 09/25/2018 0813    No results found for: "SPEP", "UPEP"  Lab Results  Component Value Date   WBC 4.0 10/13/2022   NEUTROABS 1.7 10/13/2022   HGB 12.9 (L) 10/13/2022   HCT 37.7 (L) 10/13/2022   MCV 90.8 10/13/2022   PLT 147 (L) 10/13/2022      Chemistry      Component Value Date/Time   NA 135 10/13/2022 0831   NA 139 11/29/2016 0829   K 4.4 10/13/2022 0831   K 4.3 11/29/2016 0829   CL 104 10/13/2022 0831   CO2 24 10/13/2022 0831   CO2 24 11/29/2016 0829   BUN 19 10/13/2022 0831   BUN 17.0 11/29/2016 0829   CREATININE 1.02 10/13/2022 0831   CREATININE 0.95 08/14/2020 0859   CREATININE 0.9 11/29/2016 0829      Component Value Date/Time   CALCIUM 8.7 (L) 10/13/2022 0831   CALCIUM 9.1 11/29/2016 0829   ALKPHOS 53 10/13/2022 0831   ALKPHOS 63 11/29/2016 0829   AST 17 10/13/2022 0831   AST 19 09/25/2018 0813   AST 17 11/29/2016 0829   ALT 14 10/13/2022 0831   ALT 16 09/25/2018 0813   ALT  16 11/29/2016 0829   BILITOT 0.8 10/13/2022 0831   BILITOT 0.7 09/25/2018 0813   BILITOT 0.83 11/29/2016 0829

## 2022-10-22 NOTE — Assessment & Plan Note (Signed)
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

## 2022-10-22 NOTE — Assessment & Plan Note (Signed)
His blood pressure is very high today but he is not symptomatic According to the patient, his blood pressure monitoring at home is usually within normal range I recommend vigilant blood pressure control at home and aggressive management of cardiovascular risk factors

## 2022-10-28 ENCOUNTER — Other Ambulatory Visit: Payer: Self-pay

## 2022-10-28 MED ORDER — DASATINIB 50 MG PO TABS: ORAL_TABLET | ORAL | 6 refills | Status: DC

## 2023-02-07 ENCOUNTER — Ambulatory Visit: Payer: PPO | Attending: Cardiology | Admitting: Cardiology

## 2023-02-07 ENCOUNTER — Telehealth: Payer: Self-pay | Admitting: Pharmacy Technician

## 2023-02-07 VITALS — BP 182/86 | HR 61 | Ht 74.0 in | Wt 223.0 lb

## 2023-02-07 DIAGNOSIS — I10 Essential (primary) hypertension: Secondary | ICD-10-CM

## 2023-02-07 DIAGNOSIS — I480 Paroxysmal atrial fibrillation: Secondary | ICD-10-CM | POA: Diagnosis not present

## 2023-02-07 DIAGNOSIS — I2510 Atherosclerotic heart disease of native coronary artery without angina pectoris: Secondary | ICD-10-CM

## 2023-02-07 DIAGNOSIS — I420 Dilated cardiomyopathy: Secondary | ICD-10-CM | POA: Diagnosis not present

## 2023-02-07 NOTE — Telephone Encounter (Signed)
Oral Oncology Patient Advocate Encounter  Applied for grant assistance from [employer] to cover costs of Sprycel.  Application is pending HCP diagnosis statement and final review.  Jinger Neighbors, CPhT-Adv Oncology Pharmacy Patient Advocate [facility] Direct Number: [phone]  Fax: [phone]

## 2023-02-07 NOTE — Progress Notes (Signed)
Cardiology Office Note:  .   Date:  02/07/2023  ID:  Larry Vasquez, DOB 05/06/1950, MRN [id number] PCP: Lavada Mesi, MD  [facility] Providers Cardiologist:  Donato Schultz, MD     History of Present Illness: .   Larry Vasquez is a 72 y.o. male Discussed with the use of AI scribe software History of Present Illness   The patient, a 72 year old with a history of coronary artery disease, dilated cardiomyopathy, chronic myelogenous leukemia, and hypertension, presents for a follow-up visit. He has been on Sprycel for leukemia, which has been associated with some swelling. His hypertension is managed with Losartan 50mg  daily, and he has a history of white coat syndrome. The patient's LDL goal is less than 70. He also has mild aortic insufficiency and an ascending aortic aneurysm measuring 46mm on a recent echocardiogram.  Previously, the patient had paroxysmal atrial fibrillation, which was treated with ablation in 2018. 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 However, a repeat echocardiogram showed normalization of the ejection fraction. His coronary calcium score is notably high at 2746, placing him in the 97th percentile.  The patient reports feeling well overall, with no complaints of chest pain or shortness of breath. He has been maintaining an active lifestyle, playing golf two to three times a week without any difficulty. He is also on Metoprolol (Toprol XL) 200mg  daily and takes various vitamins and supplements.  The patient has expressed a preference for natural treatments, particularly for managing his hyperlipidemia. He follows a Mediterranean diet, consumes a lot of organic foods, and acknowledges the need to lose some weight.           Studies Reviewed: .        Results LABS LDL: 156  RADIOLOGY Coronary calcium score: 2746, 97th percentile CT scan: Ascending aorta 42  mm  DIAGNOSTIC Echocardiogram: Dilated cardiomyopathy with normal ejection fraction, mild aortic insufficiency, ascending aortic aneurysm 46 mm (05/2022) Echocardiogram: Normalization of ejection fraction post ablation  Risk Assessment/Calculations:           Physical Exam:   VS:  BP (!) 182/86   Pulse 61   Ht 6\' 2"  (1.88 m)   Wt 223 lb (101.2 kg)   SpO2 96%   BMI 28.63 kg/m    Wt Readings from Last 3 Encounters:  02/07/23 223 lb (101.2 kg)  10/22/22 225 lb 6.4 oz (102.2 kg)  07/28/22 223 lb (101.2 kg)    GEN: Well nourished, well developed in no acute distress NECK: No JVD; No carotid bruits CARDIAC: RRR, no murmurs, no rubs, no gallops RESPIRATORY:  Clear to auscultation without rales, wheezing or rhonchi  ABDOMEN: Soft, non-tender, non-distended EXTREMITIES:  No edema; No deformity   ASSESSMENT AND PLAN: .    Assessment and Plan    Coronary Artery Disease with Elevated Calcium Score Coronary artery disease with a coronary calcium score of 2746 (97th percentile). Prior LDL was 156. Discussed newer cholesterol-lowering medications like Repatha, but he prefers natural management. - Order coronary calcium score - Follow up in one year for hyperlipidemia  Ascending Aortic Aneurysm Mild aortic insufficiency with an ascending aortic aneurysm measuring 46 mm on recent echocardiogram (previously 42 mm on CT). Discussed potential overestimation on echocardiogram due to artifacts. He prefers non-contrast imaging if possible. - Order CT scan of the chest to re-evaluate the ascending aorta, will get non-contrast imaging (does not want contrast)  Dilated Cardiomyopathy Dilated cardiomyopathy with normal ejection fraction  on recent echocardiogram (March 2024). 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 - Continue Losartan 50  mg daily, Metoprolol (Toprol XL) 200 mg daily  Hypertension White coat syndrome with hypertension managed on Losartan 50 mg daily. - Continue Losartan 50 mg daily  Chronic Myelogenous Leukemia Chronic myelogenous leukemia managed with Sprycel. - Continue Sprycel as prescribed  General Health Maintenance Discussed the importance of a Mediterranean diet rich in fruits and vegetables. He reports eating organic foods and is considering weight loss. - Encourage Mediterranean diet - Encourage weight loss  Follow-up - Follow up in one year for hyperlipidemia.               Signed, Donato Schultz, MD

## 2023-02-07 NOTE — Patient Instructions (Signed)
Medication Instructions:  The current medical regimen is effective;  continue present plan and medications.  *If you need a refill on your cardiac medications before your next appointment, please call your pharmacy*  Testing/Procedures: Your physician has requested that you have a Coronary Calcium score which is completed by CT. Cardiac computed tomography (CT) is a painless test that uses an x-ray machine to take clear, detailed pictures of your heart. There are no instructions for this testing.  You may eat/drink and take your normal medications this day.  The cost of the testing is $99 due at the time of your appointment. This will be completed at the [facility] location.  Follow-Up: At [facility], you and your health needs are our priority.  As part of our continuing mission to provide you with exceptional heart care, we have created designated Provider Care Teams.  These Care Teams include your primary Cardiologist (physician) and Advanced Practice Providers (APPs -  Physician Assistants and Nurse Practitioners) who all work together to provide you with the care you need, when you need it.  We recommend signing up for the patient portal called "MyChart".  Sign up information is provided on this After Visit Summary.  MyChart is used to connect with patients for Virtual Visits (Telemedicine).  Patients are able to view lab/test results, encounter notes, upcoming appointments, etc.  Non-urgent messages can be sent to your provider as well.   To learn more about what you can do with MyChart, go to [email or web address].    Your next appointment:   1 year(s)  Provider:   Donato Schultz, MD

## 2023-02-08 NOTE — Telephone Encounter (Signed)
Oral Oncology Patient Advocate Encounter  Required documentation sent to [facility] via e-fax [phone]  Jinger Neighbors, CPhT-Adv Oncology Pharmacy Patient Advocate [facility] Direct Number: [phone]  Fax: [phone]

## 2023-02-14 ENCOUNTER — Other Ambulatory Visit (HOSPITAL_COMMUNITY): Payer: Self-pay

## 2023-02-25 ENCOUNTER — Telehealth: Payer: Self-pay | Admitting: Pharmacy Technician

## 2023-02-25 ENCOUNTER — Other Ambulatory Visit (HOSPITAL_COMMUNITY): Payer: Self-pay

## 2023-02-25 NOTE — Telephone Encounter (Addendum)
Oral Oncology Patient Advocate Encounter  Was successful in securing patient a $5,000 grant from [facility] to provide copayment coverage for Sprycel.  This will keep the out of pocket expense at $0.     [employer] ID: [id number]  I have spoken with the patient.   The billing information is as follows and has been shared with [facility].    RxBin: [id number] PCN: [id number] Member ID: [id number] Group ID: [id number] Dates of Eligibility: 02/06/23 through 02/06/24  Fund:  CML - Publicly Insured  Larry Vasquez, CPhT-Adv Oncology Pharmacy Patient Advocate [facility] Direct Number: [phone]  Fax: [phone]

## 2023-02-25 NOTE — Telephone Encounter (Signed)
Oral Oncology Patient Advocate Encounter  Prior Authorization for dasatinib has been approved.    PA# [id number] Effective dates: 02/25/23 through 02/25/24  Patients co-pay is $2,407.    Jinger Neighbors, CPhT-Adv Oncology Pharmacy Patient Advocate [facility] Direct Number: [phone]  Fax: [phone]

## 2023-02-25 NOTE — Telephone Encounter (Signed)
Oral Oncology Patient Advocate Encounter   Received notification that prior authorization for dasatinib is required.   PA submitted on 02/25/23 Key [id number] Status is pending     Jinger Neighbors, CPhT-Adv Oncology Pharmacy Patient Advocate [facility] Direct Number: [phone]  Fax: [phone]

## 2023-03-04 ENCOUNTER — Other Ambulatory Visit (HOSPITAL_COMMUNITY): Payer: Self-pay

## 2023-03-15 ENCOUNTER — Other Ambulatory Visit: Payer: Self-pay | Admitting: Cardiology

## 2023-03-15 DIAGNOSIS — I4819 Other persistent atrial fibrillation: Secondary | ICD-10-CM

## 2023-03-15 DIAGNOSIS — Z01810 Encounter for preprocedural cardiovascular examination: Secondary | ICD-10-CM

## 2023-03-15 DIAGNOSIS — I10 Essential (primary) hypertension: Secondary | ICD-10-CM

## 2023-03-25 ENCOUNTER — Other Ambulatory Visit: Payer: Self-pay

## 2023-03-25 ENCOUNTER — Other Ambulatory Visit: Payer: Self-pay | Admitting: Hematology and Oncology

## 2023-03-25 ENCOUNTER — Telehealth: Payer: Self-pay | Admitting: *Deleted

## 2023-03-25 ENCOUNTER — Other Ambulatory Visit: Payer: Self-pay | Admitting: Pharmacy Technician

## 2023-03-25 ENCOUNTER — Other Ambulatory Visit (HOSPITAL_COMMUNITY): Payer: Self-pay

## 2023-03-25 ENCOUNTER — Other Ambulatory Visit: Payer: Self-pay | Admitting: *Deleted

## 2023-03-25 MED ORDER — DASATINIB 50 MG PO TABS: ORAL_TABLET | ORAL | 6 refills | Status: DC

## 2023-03-25 MED ORDER — DASATINIB 50 MG PO TABS
ORAL_TABLET | ORAL | 6 refills | Status: DC
  Filled 2023-03-25: qty 30, 30d supply, fill #0
  Filled 2023-04-14: qty 30, 30d supply, fill #1
  Filled 2023-05-18 (×2): qty 30, 30d supply, fill #2
  Filled 2023-06-22 (×2): qty 30, 30d supply, fill #3
  Filled 2023-07-26: qty 30, 30d supply, fill #4
  Filled 2023-08-29: qty 30, 30d supply, fill #5
  Filled 2023-09-26: qty 30, 30d supply, fill #6

## 2023-03-25 NOTE — Progress Notes (Unsigned)
Specialty Pharmacy Initial Fill Coordination Note  Larry Vasquez is a 73 y.o. male contacted today regarding refills of specialty medication(s) Dasatinib (SPRYCEL) .  Patient requested Larry Vasquez at [facility] at [place]  on 03/26/23   Medication will be filled on 03/25/23.   Patient is aware of $0 copayment.

## 2023-03-25 NOTE — Telephone Encounter (Signed)
Per Dr.Gorsuch, called pt to recommend stopping ALL supplements immediately due to safety and interaction concerns with chemo pill. Went over pt med list and gave names of all supplements and advised to stop them. Pt verbalized understanding.

## 2023-03-28 NOTE — Progress Notes (Signed)
Patient transferring from Patient Assistance Program to [facility]. Patient initially counseled in clinic visit in 2017 (~01/15/16). Has been stable on therapy.

## 2023-04-11 ENCOUNTER — Other Ambulatory Visit: Payer: Self-pay | Admitting: *Deleted

## 2023-04-11 ENCOUNTER — Other Ambulatory Visit: Payer: Self-pay | Admitting: Hematology and Oncology

## 2023-04-11 ENCOUNTER — Inpatient Hospital Stay: Payer: PPO | Attending: Hematology and Oncology

## 2023-04-11 DIAGNOSIS — C9210 Chronic myeloid leukemia, BCR/ABL-positive, not having achieved remission: Secondary | ICD-10-CM

## 2023-04-11 DIAGNOSIS — I509 Heart failure, unspecified: Secondary | ICD-10-CM

## 2023-04-11 DIAGNOSIS — Z79899 Other long term (current) drug therapy: Secondary | ICD-10-CM | POA: Insufficient documentation

## 2023-04-11 DIAGNOSIS — I10 Essential (primary) hypertension: Secondary | ICD-10-CM

## 2023-04-11 LAB — CBC WITH DIFFERENTIAL/PLATELET
Abs Immature Granulocytes: 0.01 10*3/uL (ref 0.00–0.07)
Basophils Absolute: 0.1 10*3/uL (ref 0.0–0.1)
Basophils Relative: 1 %
Eosinophils Absolute: 0.2 10*3/uL (ref 0.0–0.5)
Eosinophils Relative: 4 %
HCT: 38.9 % — ABNORMAL LOW (ref 39.0–52.0)
Hemoglobin: 13.2 g/dL (ref 13.0–17.0)
Immature Granulocytes: 0 %
Lymphocytes Relative: 39 %
Lymphs Abs: 1.6 10*3/uL (ref 0.7–4.0)
MCH: 30.3 pg (ref 26.0–34.0)
MCHC: 33.9 g/dL (ref 30.0–36.0)
MCV: 89.4 fL (ref 80.0–100.0)
Monocytes Absolute: 0.6 10*3/uL (ref 0.1–1.0)
Monocytes Relative: 13 %
Neutro Abs: 1.8 10*3/uL (ref 1.7–7.7)
Neutrophils Relative %: 43 %
Platelets: 150 10*3/uL (ref 150–400)
RBC: 4.35 MIL/uL (ref 4.22–5.81)
RDW: 13.2 % (ref 11.5–15.5)
WBC: 4.1 10*3/uL (ref 4.0–10.5)
nRBC: 0 % (ref 0.0–0.2)

## 2023-04-11 LAB — COMPREHENSIVE METABOLIC PANEL
ALT: 16 U/L (ref 0–44)
AST: 19 U/L (ref 15–41)
Albumin: 4.1 g/dL (ref 3.5–5.0)
Alkaline Phosphatase: 58 U/L (ref 38–126)
Anion gap: 7 (ref 5–15)
BUN: 17 mg/dL (ref 8–23)
CO2: 26 mmol/L (ref 22–32)
Calcium: 9.1 mg/dL (ref 8.9–10.3)
Chloride: 101 mmol/L (ref 98–111)
Creatinine, Ser: 1.03 mg/dL (ref 0.61–1.24)
GFR, Estimated: >60 mL/min (ref >60)
Glucose, Bld: 115 mg/dL — ABNORMAL HIGH (ref 70–99)
Potassium: 4 mmol/L (ref 3.5–5.1)
Sodium: 134 mmol/L — ABNORMAL LOW (ref 135–145)
Total Bilirubin: 0.8 mg/dL (ref 0.0–1.2)
Total Protein: 7 g/dL (ref 6.5–8.1)

## 2023-04-12 ENCOUNTER — Other Ambulatory Visit (HOSPITAL_COMMUNITY): Payer: Self-pay

## 2023-04-14 ENCOUNTER — Other Ambulatory Visit (HOSPITAL_COMMUNITY): Payer: Self-pay

## 2023-04-14 ENCOUNTER — Other Ambulatory Visit: Payer: Self-pay

## 2023-04-14 ENCOUNTER — Other Ambulatory Visit (HOSPITAL_COMMUNITY): Payer: Self-pay | Admitting: Pharmacy Technician

## 2023-04-14 NOTE — Progress Notes (Signed)
 Specialty Pharmacy Ongoing Clinical Assessment Note  Larry Vasquez is a 73 y.o. male who is being followed by the specialty pharmacy service for RxSp Oncology   Patient's specialty medication(s) reviewed today: Dasatinib  (SPRYCEL )   Missed doses in the last 4 weeks: 0   Patient/Caregiver did not have any additional questions or concerns.   Therapeutic benefit summary: Patient is achieving benefit   Adverse events/side effects summary: No adverse events/side effects   Patient's therapy is appropriate to: Continue    Goals Addressed             This Visit's Progress    Maintain optimal adherence to therapy   On track    Patient is on track. Patient will maintain adherence         Follow up:  6 months  [name] Specialty Pharmacist

## 2023-04-14 NOTE — Progress Notes (Signed)
 Specialty Pharmacy Refill Coordination Note  Larry Vasquez is a 73 y.o. male contacted today regarding refills of specialty medication(s) Dasatinib  (SPRYCEL )   Patient requested [name] at [facility] at [place] date: 04/22/23   Medication will be filled on 04/21/23.

## 2023-04-15 LAB — BCR-ABL1, CML/ALL, PCR, QUANT
E1A2 Transcript: <0.0032 %
Interpretation (BCRAL):: NEGATIVE
b2a2 transcript: <0.0032 %
b3a2 transcript: <0.0032 %

## 2023-04-21 ENCOUNTER — Inpatient Hospital Stay (HOSPITAL_BASED_OUTPATIENT_CLINIC_OR_DEPARTMENT_OTHER): Payer: PPO | Admitting: Hematology and Oncology

## 2023-04-21 ENCOUNTER — Encounter: Payer: Self-pay | Admitting: Hematology and Oncology

## 2023-04-21 VITALS — BP 196/69 | HR 56 | Temp 98.2°F | Resp 18 | Ht 74.0 in | Wt 222.2 lb

## 2023-04-21 DIAGNOSIS — I10 Essential (primary) hypertension: Secondary | ICD-10-CM | POA: Diagnosis not present

## 2023-04-21 DIAGNOSIS — C9210 Chronic myeloid leukemia, BCR/ABL-positive, not having achieved remission: Secondary | ICD-10-CM | POA: Diagnosis not present

## 2023-04-21 NOTE — Progress Notes (Signed)
[facility] OFFICE PROGRESS NOTE  Patient Care Team: Plover, Casimiro Needle, MD as PCP - General (Family Medicine) Jake Bathe, MD as PCP - Cardiology (Cardiology) Blima Ledger, OD (Optometry)  ASSESSMENT & PLAN:  Chronic myelogenous leukemia [facility]) He has excellent response to treatment and remained in complete response We discussed the risk and benefits of chronic maintenance treatment with low-dose Sprycel He is in agreement I will see him again in 6 months for further follow-up I warned the patient side effects including fluid retention, leg swelling or shortness of breath  White coat syndrome with hypertension His blood pressure is very high today but he is not symptomatic According to the patient, his blood pressure monitoring at home is usually within normal range  No orders of the defined types were placed in this encounter.   All questions were answered. The patient knows to call the clinic with any problems, questions or concerns. The total time spent in the appointment was 20 minutes encounter with patients including review of chart and various tests results, discussions about plan of care and coordination of care plan   Artis Delay, MD 04/21/2023 11:00 AM  INTERVAL HISTORY: Please see below for problem oriented charting. he returns for chemo follow-up He tolerated Dasatinib well Even though his blood pressure is elevated today, he is not symptomatic No recent infection  REVIEW OF SYSTEMS:   Constitutional: Denies fevers, chills or abnormal weight loss Eyes: Denies blurriness of vision Ears, nose, mouth, throat, and face: Denies mucositis or sore throat Respiratory: Denies cough, dyspnea or wheezes Cardiovascular: Denies palpitation, chest discomfort or lower extremity swelling Gastrointestinal:  Denies nausea, heartburn or change in bowel habits Skin: Denies abnormal skin rashes Lymphatics: Denies new lymphadenopathy or easy bruising Neurological:Denies  numbness, tingling or new weaknesses Behavioral/Psych: Mood is stable, no new changes  All other systems were reviewed with the patient and are negative.  I have reviewed the past medical history, past surgical history, social history and family history with the patient and they are unchanged from previous note.  ALLERGIES:  is allergic to corticosteroids.  MEDICATIONS:  Current Outpatient Medications  Medication Sig Dispense Refill   Cholecalciferol (VITAMIN D PO) Take 5,000 Units by mouth daily.     dasatinib (SPRYCEL) 50 MG tablet TAKE 1 TABLET BY MOUTH ONCE DAILY AT THE SAME TIME. MAY TAKE WITH OR WITHOUT FOOD. SWALLOW WHOLE. AVOID GRAPEFRUIT PRODUCTS. 30 tablet 6   losartan (COZAAR) 50 MG tablet Take 1 tablet (50 mg total) by mouth daily. 90 tablet 3   metoprolol (TOPROL-XL) 200 MG 24 hr tablet TAKE 1 TABLET BY MOUTH EVERY DAY 90 tablet 2   No current facility-administered medications for this visit.    SUMMARY OF ONCOLOGIC HISTORY: Oncology History Overview Note  Physical   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HCC)  11/01/2012 Bone Marrow Biopsy   Bone marrow aspirate and biopsy was performed for leukocytosis and thrombocytosis. 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   11/07/2012 - 08/09/2013 Chemotherapy   He was started on Gleevec 400 mg daily.   08/09/2013 Progression   Gleevec was discontinued due to a rising white blood cell count and positive ABL Kinase mutation study, 100% positive to E255V mutation   08/09/2013 - 08/21/2013 Chemotherapy   His therapy is switched to hydroxyurea 1000 mg daily pending approval for Dasatinib.   08/22/2013 -  Chemotherapy   He is started on dasatinib.   11/19/2013 Tumor Marker   BCR/ABL is improving   02/22/2014 Pathology  Results   BCR/ABL continues to improve and the patient is moving towards molecular remission   05/27/2014 Pathology Results   BCR/ABL b2a2 & b3a2 both are 0.05%, b2a2 & b3a2 IS both are 0.028%. Patient in MMR   09/26/2014  Pathology Results   Repeat BCR/ABL was not detectable   02/28/2015 Tumor Marker   Repeat BCR/ABL was not detectable   08/28/2015 Tumor Marker   Repeat BCR/ABL was not detectable   01/06/2016 Pathology Results   Repeat BCR/ABL was not detectable   04/15/2016 Pathology Results   Repeat BCR/ABL was not detectable   07/22/2016 Pathology Results   Repeat BCR/ABL was not detectable   11/29/2016 Pathology Results   Repeat BCR/ABL was not detectable   05/30/2017 Pathology Results   Repeat BCR/ABL was not detectable   11/29/2017 Pathology Results   Repeat BCR/ABL was not detectable   10/02/2018 Pathology Results   BCR/ABL is not detectable.   03/23/2019 Pathology Results   BCR/ABL is not detectable   09/14/2019 Pathology Results   BCR/ABL is not detectable   02/15/2020 Pathology Results   BCR/ABL is not detectable   08/27/2020 Pathology Results   BCR/ABL is not detectable   02/23/2021 Pathology Results   Repeat BCR/ABL was not detectable   08/31/2021 Pathology Results   BCR/ABL is not detectable   03/26/2022 Pathology Results   BCR/ABL is not detectable     10/13/2022 Pathology Results   Repeat BCR/ABL was not detectable   04/11/2023 Pathology Results   Repeat BCR/ABL was not detectable     PHYSICAL EXAMINATION: ECOG PERFORMANCE STATUS: 0 - Asymptomatic  Vitals:   04/21/23 0938  BP: (!) 196/69  Pulse: (!) 56  Resp: 18  Temp: 98.2 F (36.8 C)  SpO2: 99%   Filed Weights   04/21/23 0938  Weight: 222 lb 3.2 oz (100.8 kg)    GENERAL:alert, no distress and comfortable NEURO: alert & oriented x 3 with fluent speech, no focal motor/sensory deficits  LABORATORY DATA:  I have reviewed the data as listed    Component Value Date/Time   NA 134 (L) 04/11/2023 0912   NA 139 11/29/2016 0829   K 4.0 04/11/2023 0912   K 4.3 11/29/2016 0829   CL 101 04/11/2023 0912   CO2 26 04/11/2023 0912   CO2 24 11/29/2016 0829   GLUCOSE 115 (H) 04/11/2023 0912   GLUCOSE 101  11/29/2016 0829   BUN 17 04/11/2023 0912   BUN 17.0 11/29/2016 0829   CREATININE 1.03 04/11/2023 0912   CREATININE 0.95 08/14/2020 0859   CREATININE 0.9 11/29/2016 0829   CALCIUM 9.1 04/11/2023 0912   CALCIUM 9.1 11/29/2016 0829   PROT 7.0 04/11/2023 0912   PROT 7.0 11/29/2016 0829   ALBUMIN 4.1 04/11/2023 0912   ALBUMIN 3.8 11/29/2016 0829   AST 19 04/11/2023 0912   AST 19 09/25/2018 0813   AST 17 11/29/2016 0829   ALT 16 04/11/2023 0912   ALT 16 09/25/2018 0813   ALT 16 11/29/2016 0829   ALKPHOS 58 04/11/2023 0912   ALKPHOS 63 11/29/2016 0829   BILITOT 0.8 04/11/2023 0912   BILITOT 0.7 09/25/2018 0813   BILITOT 0.83 11/29/2016 0829   GFRNONAA >60 04/11/2023 0912   GFRNONAA >60 09/25/2018 0813   GFRAA >60 09/14/2019 0800   GFRAA >60 09/25/2018 0813    No results found for: "SPEP", "UPEP"  Lab Results  Component Value Date   WBC 4.1 04/11/2023   NEUTROABS 1.8 04/11/2023   HGB 13.2 04/11/2023  HCT 38.9 (L) 04/11/2023   MCV 89.4 04/11/2023   PLT 150 04/11/2023      Chemistry      Component Value Date/Time   NA 134 (L) 04/11/2023 0912   NA 139 11/29/2016 0829   K 4.0 04/11/2023 0912   K 4.3 11/29/2016 0829   CL 101 04/11/2023 0912   CO2 26 04/11/2023 0912   CO2 24 11/29/2016 0829   BUN 17 04/11/2023 0912   BUN 17.0 11/29/2016 0829   CREATININE 1.03 04/11/2023 0912   CREATININE 0.95 08/14/2020 0859   CREATININE 0.9 11/29/2016 0829      Component Value Date/Time   CALCIUM 9.1 04/11/2023 0912   CALCIUM 9.1 11/29/2016 0829   ALKPHOS 58 04/11/2023 0912   ALKPHOS 63 11/29/2016 0829   AST 19 04/11/2023 0912   AST 19 09/25/2018 0813   AST 17 11/29/2016 0829   ALT 16 04/11/2023 0912   ALT 16 09/25/2018 0813   ALT 16 11/29/2016 0829   BILITOT 0.8 04/11/2023 0912   BILITOT 0.7 09/25/2018 0813   BILITOT 0.83 11/29/2016 0829

## 2023-04-21 NOTE — Assessment & Plan Note (Signed)
He has excellent response to treatment and remained in complete response We discussed the risk and benefits of chronic maintenance treatment with low-dose Sprycel He is in agreement I will see him again in 6 months for further follow-up I warned the patient side effects including fluid retention, leg swelling or shortness of breath

## 2023-04-21 NOTE — Assessment & Plan Note (Signed)
His blood pressure is very high today but he is not symptomatic According to the patient, his blood pressure monitoring at home is usually within normal range

## 2023-05-02 ENCOUNTER — Ambulatory Visit (HOSPITAL_BASED_OUTPATIENT_CLINIC_OR_DEPARTMENT_OTHER): Payer: PPO

## 2023-05-18 ENCOUNTER — Other Ambulatory Visit (HOSPITAL_COMMUNITY): Payer: Self-pay

## 2023-05-18 ENCOUNTER — Other Ambulatory Visit: Payer: Self-pay

## 2023-05-18 NOTE — Progress Notes (Signed)
 Specialty Pharmacy Refill Coordination Note  Zhyon Antenucci is a 73 y.o. male contacted today regarding refills of specialty medication(s) Sprycel.  Patient requested (Patient-Rptd) Pickup at [facility] at [facility] date: (Patient-Rptd) 05/23/23   Medication will be filled on 05/20/23.

## 2023-06-01 ENCOUNTER — Other Ambulatory Visit (HOSPITAL_COMMUNITY): Payer: PPO

## 2023-06-22 ENCOUNTER — Other Ambulatory Visit (HOSPITAL_COMMUNITY): Payer: Self-pay

## 2023-06-22 ENCOUNTER — Other Ambulatory Visit: Payer: Self-pay

## 2023-06-22 NOTE — Progress Notes (Signed)
 Specialty Pharmacy Refill Coordination Note  Larry Vasquez is a 73 y.o. male contacted today regarding refills of specialty medication(s) Sprycel.  Patient requested (Patient-Rptd) Pickup at [facility] at [facility] date: (Patient-Rptd) 06/27/23   Medication will be filled on 06/24/23.

## 2023-06-24 ENCOUNTER — Other Ambulatory Visit: Payer: Self-pay

## 2023-07-25 ENCOUNTER — Other Ambulatory Visit: Payer: Self-pay

## 2023-07-26 ENCOUNTER — Other Ambulatory Visit: Payer: Self-pay

## 2023-07-26 ENCOUNTER — Other Ambulatory Visit: Payer: Self-pay | Admitting: Pharmacy Technician

## 2023-07-26 NOTE — Progress Notes (Signed)
 Specialty Pharmacy Refill Coordination Note  Larry Vasquez is a 73 y.o. male contacted today regarding refills of specialty medication(s) Dasatinib  (SPRYCEL )   Patient requested [place] [facility] at [facility] at [place] date: 07/27/23   Medication will be filled on 07/27/23.

## 2023-08-26 ENCOUNTER — Other Ambulatory Visit: Payer: Self-pay

## 2023-08-27 ENCOUNTER — Encounter (INDEPENDENT_AMBULATORY_CARE_PROVIDER_SITE_OTHER): Payer: Self-pay

## 2023-08-29 ENCOUNTER — Other Ambulatory Visit: Payer: Self-pay

## 2023-08-29 NOTE — Progress Notes (Signed)
 Specialty Pharmacy Refill Coordination Note  Larry Vasquez is a 74 y.o. male contacted today regarding refills of specialty medication(s) Dasatinib  (SPRYCEL )   Patient requested [name] at [facility] at [place] date: 08/31/23   Medication will be filled on 06.24.25.

## 2023-09-26 ENCOUNTER — Other Ambulatory Visit: Payer: Self-pay

## 2023-09-26 ENCOUNTER — Encounter (INDEPENDENT_AMBULATORY_CARE_PROVIDER_SITE_OTHER): Payer: Self-pay

## 2023-09-26 NOTE — Progress Notes (Signed)
 Specialty Pharmacy Refill Coordination Note  Larry Vasquez is a 73 y.o. male contacted today regarding refills of specialty medication(s) Dasatinib  (SPRYCEL )   Patient requested (Patient-Rptd) Pickup at [facility] at [facility] date: (Patient-Rptd) 09/28/23   Medication will be filled on 07.22.25.

## 2023-10-05 ENCOUNTER — Other Ambulatory Visit: Payer: Self-pay

## 2023-10-06 ENCOUNTER — Other Ambulatory Visit (HOSPITAL_COMMUNITY): Payer: Self-pay

## 2023-10-06 ENCOUNTER — Other Ambulatory Visit: Payer: Self-pay

## 2023-10-06 ENCOUNTER — Other Ambulatory Visit: Payer: Self-pay | Admitting: Hematology and Oncology

## 2023-10-06 MED ORDER — DASATINIB 50 MG PO TABS
ORAL_TABLET | ORAL | 6 refills | Status: DC
  Filled 2023-10-06: qty 30, 30d supply, fill #0
  Filled 2023-11-16: qty 30, 30d supply, fill #1
  Filled 2023-12-16: qty 30, 30d supply, fill #2
  Filled 2024-01-09: qty 30, 30d supply, fill #3
  Filled 2024-02-09: qty 30, 30d supply, fill #4
  Filled 2024-03-09 – 2024-03-13 (×2): qty 30, 30d supply, fill #5

## 2023-10-06 NOTE — Progress Notes (Signed)
 Specialty Pharmacy Refill Coordination Note  Larry Vasquez is a 73 y.o. male contacted today regarding refills of specialty medication(s) Dasatinib  (SPRYCEL )   Patient requested [name] at [facility] at [place] date: 10/25/23   Medication will be filled on 10/24/23.

## 2023-10-06 NOTE — Progress Notes (Signed)
 Specialty Pharmacy Ongoing Clinical Assessment Note  Larry Vasquez is a 73 y.o. male who is being followed by the specialty pharmacy service for RxSp Oncology   Patient's specialty medication(s) reviewed today: Dasatinib  (SPRYCEL )   Missed doses in the last 4 weeks: 0   Patient/Caregiver did not have any additional questions or concerns.   Therapeutic benefit summary: Patient is achieving benefit   Adverse events/side effects summary: No adverse events/side effects   Patient's therapy is appropriate to: Continue    Goals Addressed             This Visit's Progress    Maintain optimal adherence to therapy   On track    Patient is on track. Patient will maintain adherence         Follow up: 6 months  Silvano LOISE Dolly Specialty Pharmacist

## 2023-10-10 ENCOUNTER — Inpatient Hospital Stay: Payer: PPO | Attending: Hematology and Oncology

## 2023-10-10 DIAGNOSIS — I509 Heart failure, unspecified: Secondary | ICD-10-CM

## 2023-10-10 DIAGNOSIS — Z79899 Other long term (current) drug therapy: Secondary | ICD-10-CM | POA: Diagnosis not present

## 2023-10-10 DIAGNOSIS — I10 Essential (primary) hypertension: Secondary | ICD-10-CM | POA: Insufficient documentation

## 2023-10-10 DIAGNOSIS — C9210 Chronic myeloid leukemia, BCR/ABL-positive, not having achieved remission: Secondary | ICD-10-CM | POA: Diagnosis present

## 2023-10-10 LAB — CBC WITH DIFFERENTIAL/PLATELET
Abs Immature Granulocytes: 0.02 K/uL (ref 0.00–0.07)
Basophils Absolute: 0 K/uL (ref 0.0–0.1)
Basophils Relative: 1 %
Eosinophils Absolute: 0.2 K/uL (ref 0.0–0.5)
Eosinophils Relative: 4 %
HCT: 38.4 % — ABNORMAL LOW (ref 39.0–52.0)
Hemoglobin: 13.3 g/dL (ref 13.0–17.0)
Immature Granulocytes: 0 %
Lymphocytes Relative: 38 %
Lymphs Abs: 1.7 K/uL (ref 0.7–4.0)
MCH: 31.1 pg (ref 26.0–34.0)
MCHC: 34.6 g/dL (ref 30.0–36.0)
MCV: 89.7 fL (ref 80.0–100.0)
Monocytes Absolute: 0.7 K/uL (ref 0.1–1.0)
Monocytes Relative: 15 %
Neutro Abs: 1.9 K/uL (ref 1.7–7.7)
Neutrophils Relative %: 42 %
Platelets: 151 K/uL (ref 150–400)
RBC: 4.28 MIL/uL (ref 4.22–5.81)
RDW: 13.7 % (ref 11.5–15.5)
WBC: 4.5 K/uL (ref 4.0–10.5)
nRBC: 0 % (ref 0.0–0.2)

## 2023-10-10 LAB — COMPREHENSIVE METABOLIC PANEL WITH GFR
ALT: 14 U/L (ref 0–44)
AST: 19 U/L (ref 15–41)
Albumin: 4.2 g/dL (ref 3.5–5.0)
Alkaline Phosphatase: 51 U/L (ref 38–126)
Anion gap: 5 (ref 5–15)
BUN: 17 mg/dL (ref 8–23)
CO2: 26 mmol/L (ref 22–32)
Calcium: 8.8 mg/dL — ABNORMAL LOW (ref 8.9–10.3)
Chloride: 102 mmol/L (ref 98–111)
Creatinine, Ser: 1.09 mg/dL (ref 0.61–1.24)
GFR, Estimated: >60 mL/min (ref >60)
Glucose, Bld: 102 mg/dL — ABNORMAL HIGH (ref 70–99)
Potassium: 4.4 mmol/L (ref 3.5–5.1)
Sodium: 133 mmol/L — ABNORMAL LOW (ref 135–145)
Total Bilirubin: 0.8 mg/dL (ref 0.0–1.2)
Total Protein: 7.1 g/dL (ref 6.5–8.1)

## 2023-10-12 ENCOUNTER — Other Ambulatory Visit: Payer: Self-pay

## 2023-10-12 ENCOUNTER — Other Ambulatory Visit (HOSPITAL_COMMUNITY): Payer: Self-pay

## 2023-10-14 LAB — BCR-ABL1, CML/ALL, PCR, QUANT
E1A2 Transcript: <0.0032 %
Interpretation (BCRAL):: NEGATIVE
b2a2 transcript: <0.0032 %
b3a2 transcript: <0.0032 %

## 2023-10-19 ENCOUNTER — Other Ambulatory Visit: Payer: Self-pay

## 2023-10-20 ENCOUNTER — Encounter: Payer: Self-pay | Admitting: Hematology and Oncology

## 2023-10-20 ENCOUNTER — Inpatient Hospital Stay (HOSPITAL_BASED_OUTPATIENT_CLINIC_OR_DEPARTMENT_OTHER): Payer: PPO | Admitting: Hematology and Oncology

## 2023-10-20 VITALS — BP 148/78 | HR 57 | Temp 98.0°F | Resp 18 | Ht 74.0 in | Wt 219.6 lb

## 2023-10-20 DIAGNOSIS — I10 Essential (primary) hypertension: Secondary | ICD-10-CM | POA: Diagnosis not present

## 2023-10-20 DIAGNOSIS — C9210 Chronic myeloid leukemia, BCR/ABL-positive, not having achieved remission: Secondary | ICD-10-CM | POA: Diagnosis not present

## 2023-10-20 NOTE — Assessment & Plan Note (Addendum)
 His blood pressure is very high initially with systolic blood pressure over 200 According to the patient, his blood pressure monitoring at home is usually within normal range Repeat manual blood pressure show his systolic blood pressure is in the 140s today Will continue close monitoring and follow-up with primary care doctor and cardiologist

## 2023-10-20 NOTE — Progress Notes (Signed)
 [facility] OFFICE PROGRESS NOTE  Patient Care Team: [place], Ozell, MD as PCP - General (Family Medicine) Jeffrie Oneil BROCKS, MD as PCP - Cardiology (Cardiology) Cleotilde Sewer, OD (Optometry)  Assessment & Plan Chronic myelogenous leukemia [facility]) The patient was diagnosed with CML in 2014, originally treated with Gleevec  but that was discontinued due to positive ABL kinase mutation study, 100% positive to E255V mutation  His treatment is subsequently switched to Dasatinib  in 2015 and he achieved major molecular response in March 2016  He has excellent response to treatment and remained in complete response with no detectable BCR/ABL He tolerated treatment well with no side effects and he will continue the same I will see him again in 6 months for further follow-up I warned the patient side effects including fluid retention, leg swelling or shortness of breath White coat syndrome with hypertension His blood pressure is very high initially with systolic blood pressure over 200 According to the patient, his blood pressure monitoring at home is usually within normal range Repeat manual blood pressure show his systolic blood pressure is in the 140s today Will continue close monitoring and follow-up with primary care doctor and cardiologist  No orders of the defined types were placed in this encounter.    Almarie Bedford, MD  INTERVAL HISTORY: he returns for surveillance follow-up for CML Patient denies recent infection or missing doses We reviewed medication list  We discussed test results and future plan of care as outlined above  PHYSICAL EXAMINATION: ECOG PERFORMANCE STATUS: 0 - Asymptomatic  Vitals:   10/20/23 0815  BP: (!) 148/78  Pulse: (!) 57  Resp: 18  Temp: 98 F (36.7 C)  SpO2: 98%   Lab Results  Component Value Date   WBC 4.5 10/10/2023   HGB 13.3 10/10/2023   HCT 38.4 (L) 10/10/2023   MCV 89.7 10/10/2023   PLT 151 10/10/2023

## 2023-10-20 NOTE — Assessment & Plan Note (Addendum)
 The patient was diagnosed with CML in 2014, originally treated with Gleevec  but that was discontinued due to positive ABL kinase mutation study, 100% positive to E255V mutation  His treatment is subsequently switched to Dasatinib  in 2015 and he achieved major molecular response in March 2016  He has excellent response to treatment and remained in complete response with no detectable BCR/ABL He tolerated treatment well with no side effects and he will continue the same I will see him again in 6 months for further follow-up I warned the patient side effects including fluid retention, leg swelling or shortness of breath

## 2023-10-24 ENCOUNTER — Other Ambulatory Visit: Payer: Self-pay

## 2023-11-16 ENCOUNTER — Other Ambulatory Visit (HOSPITAL_COMMUNITY): Payer: Self-pay

## 2023-11-16 ENCOUNTER — Encounter (INDEPENDENT_AMBULATORY_CARE_PROVIDER_SITE_OTHER): Payer: Self-pay

## 2023-11-16 ENCOUNTER — Other Ambulatory Visit: Payer: Self-pay

## 2023-11-16 NOTE — Progress Notes (Signed)
 Specialty Pharmacy Refill Coordination Note  MyChart Questionnaire Submission  Larry Vasquez is a 73 y.o. male contacted today regarding refills of specialty medication(s) Sprycel .  Doses on hand: (Patient-Rptd) 10 days   Patient requested: (Patient-Rptd) Pickup at [facility] at [facility] date: 11/23/23  Medication will be filled on 11/22/23.

## 2023-11-22 ENCOUNTER — Other Ambulatory Visit: Payer: Self-pay

## 2023-12-15 ENCOUNTER — Other Ambulatory Visit: Payer: Self-pay

## 2023-12-16 ENCOUNTER — Other Ambulatory Visit: Payer: Self-pay

## 2023-12-16 ENCOUNTER — Encounter (INDEPENDENT_AMBULATORY_CARE_PROVIDER_SITE_OTHER): Payer: Self-pay

## 2023-12-16 ENCOUNTER — Other Ambulatory Visit (HOSPITAL_COMMUNITY): Payer: Self-pay

## 2023-12-16 NOTE — Progress Notes (Signed)
 Specialty Pharmacy Refill Coordination Note  MyChart Questionnaire Submission  Larry Vasquez is a 73 y.o. male contacted today regarding refills of specialty medication(s) Sprycel .  Doses on hand: (Patient-Rptd) 7 days   Patient requested: (Patient-Rptd) Pickup at [facility] at [facility] date: 12/19/23  Medication will be filled on 12/16/23.

## 2024-01-09 ENCOUNTER — Other Ambulatory Visit: Payer: Self-pay

## 2024-01-09 ENCOUNTER — Other Ambulatory Visit: Payer: Self-pay | Admitting: Pharmacy Technician

## 2024-01-09 ENCOUNTER — Other Ambulatory Visit (HOSPITAL_COMMUNITY): Payer: Self-pay

## 2024-01-09 ENCOUNTER — Encounter (INDEPENDENT_AMBULATORY_CARE_PROVIDER_SITE_OTHER): Payer: Self-pay

## 2024-01-09 NOTE — Progress Notes (Signed)
 Specialty Pharmacy Refill Coordination Note  Larry Vasquez is a 73 y.o. male contacted today regarding refills of specialty medication(s) Dasatinib  (SPRYCEL )   Patient requested (Patient-Rptd) Pickup at [facility] at [facility] date: (Patient-Rptd) 01/16/24   Medication will be filled on: 01/13/2024

## 2024-01-12 ENCOUNTER — Other Ambulatory Visit: Payer: Self-pay

## 2024-01-12 DIAGNOSIS — I4819 Other persistent atrial fibrillation: Secondary | ICD-10-CM

## 2024-01-12 DIAGNOSIS — I10 Essential (primary) hypertension: Secondary | ICD-10-CM

## 2024-01-12 DIAGNOSIS — Z01810 Encounter for preprocedural cardiovascular examination: Secondary | ICD-10-CM

## 2024-01-13 MED ORDER — METOPROLOL SUCCINATE ER 200 MG PO TB24: 200.0000 mg | ORAL_TABLET | Freq: Every day | ORAL | 0 refills | Status: DC

## 2024-02-09 ENCOUNTER — Other Ambulatory Visit (HOSPITAL_COMMUNITY): Payer: Self-pay

## 2024-02-10 ENCOUNTER — Other Ambulatory Visit: Payer: Self-pay | Admitting: Pharmacy Technician

## 2024-02-10 ENCOUNTER — Other Ambulatory Visit: Payer: Self-pay

## 2024-02-10 NOTE — Progress Notes (Signed)
 Specialty Pharmacy Refill Coordination Note  Larry Vasquez is a 73 y.o. male contacted today regarding refills of specialty medication(s)Dasatinib  (SPRYCEL )    Patient requested (Patient-Rptd) Pickup at [facility] at [facility] date: (Patient-Rptd) 02/17/24   Medication will be filled on: 02/16/2024

## 2024-02-16 ENCOUNTER — Other Ambulatory Visit: Payer: Self-pay

## 2024-03-07 ENCOUNTER — Other Ambulatory Visit: Payer: Self-pay

## 2024-03-09 ENCOUNTER — Other Ambulatory Visit (HOSPITAL_COMMUNITY): Payer: Self-pay

## 2024-03-13 ENCOUNTER — Other Ambulatory Visit: Payer: Self-pay

## 2024-03-14 ENCOUNTER — Other Ambulatory Visit: Payer: Self-pay

## 2024-03-15 ENCOUNTER — Other Ambulatory Visit (HOSPITAL_COMMUNITY): Payer: Self-pay

## 2024-03-19 ENCOUNTER — Other Ambulatory Visit: Payer: Self-pay

## 2024-03-19 ENCOUNTER — Other Ambulatory Visit: Payer: Self-pay | Admitting: Pharmacist

## 2024-03-19 MED ORDER — DASATINIB 50 MG PO TABS: ORAL_TABLET | ORAL | 6 refills | Status: AC

## 2024-03-20 ENCOUNTER — Other Ambulatory Visit (HOSPITAL_COMMUNITY): Payer: Self-pay

## 2024-03-20 ENCOUNTER — Other Ambulatory Visit: Payer: Self-pay

## 2024-03-21 ENCOUNTER — Telehealth: Payer: Self-pay

## 2024-03-21 NOTE — Telephone Encounter (Signed)
"   Larry Vasquez is a 74 y.o. male contacted today regarding refills of specialty medication(s) Dasatinib  (SPRYCEL ). Call about getting a foundation to help with his copay. Pt was to received a call from the foundation who agreed to pay copay but is unsure of who contacted him. No note left to track. "

## 2024-03-22 ENCOUNTER — Telehealth: Payer: Self-pay

## 2024-03-22 NOTE — Telephone Encounter (Signed)
 Patient now filling Dasatinib  through [facility] for copay assistance.  Prescription and patient info has been sent to the pharmacy 03/19/2024.   Charlott Hamilton,  CPhT-Adv  she/her/hers [facility]  [facility] Specialty Pharmacy Services Pharmacy Technician Patient Advocate [facility] Phone: [phone]  Fax: [phone] [email or web address]

## 2024-03-22 NOTE — Telephone Encounter (Signed)
 Patient encouraged to call [facility] at P#[phone] to expedite filling/delivery process.

## 2024-03-27 ENCOUNTER — Other Ambulatory Visit (HOSPITAL_COMMUNITY): Payer: Self-pay

## 2024-04-11 ENCOUNTER — Other Ambulatory Visit

## 2024-04-11 ENCOUNTER — Other Ambulatory Visit: Payer: Self-pay | Admitting: Cardiology

## 2024-04-11 DIAGNOSIS — I10 Essential (primary) hypertension: Secondary | ICD-10-CM

## 2024-04-11 DIAGNOSIS — I4819 Other persistent atrial fibrillation: Secondary | ICD-10-CM

## 2024-04-11 DIAGNOSIS — Z01810 Encounter for preprocedural cardiovascular examination: Secondary | ICD-10-CM

## 2024-04-16 ENCOUNTER — Inpatient Hospital Stay

## 2024-04-23 ENCOUNTER — Ambulatory Visit: Admitting: Hematology and Oncology

## 2024-04-24 ENCOUNTER — Inpatient Hospital Stay: Admitting: Hematology and Oncology
